# Patient Record
Sex: Female | Born: 1940
Health system: Southern US, Community
[De-identification: ages and names within clinical notes are randomized; demographics above are authoritative.]

## PROBLEM LIST (undated history)

## (undated) DIAGNOSIS — G4700 Insomnia, unspecified: Secondary | ICD-10-CM

## (undated) DIAGNOSIS — W19XXXA Unspecified fall, initial encounter: Secondary | ICD-10-CM

## (undated) DIAGNOSIS — M5430 Sciatica, unspecified side: Secondary | ICD-10-CM

## (undated) DIAGNOSIS — S0990XA Unspecified injury of head, initial encounter: Secondary | ICD-10-CM

## (undated) DIAGNOSIS — F32A Depression, unspecified: Secondary | ICD-10-CM

## (undated) DIAGNOSIS — M549 Dorsalgia, unspecified: Secondary | ICD-10-CM

## (undated) DIAGNOSIS — M2540 Effusion, unspecified joint: Secondary | ICD-10-CM

## (undated) DIAGNOSIS — F329 Major depressive disorder, single episode, unspecified: Secondary | ICD-10-CM

## (undated) DIAGNOSIS — K219 Gastro-esophageal reflux disease without esophagitis: Secondary | ICD-10-CM

## (undated) DIAGNOSIS — M48062 Spinal stenosis, lumbar region with neurogenic claudication: Secondary | ICD-10-CM

## (undated) DIAGNOSIS — R55 Syncope and collapse: Secondary | ICD-10-CM

## (undated) DIAGNOSIS — H3530 Unspecified macular degeneration: Secondary | ICD-10-CM

## (undated) DIAGNOSIS — I10 Essential (primary) hypertension: Secondary | ICD-10-CM

## (undated) DIAGNOSIS — M25469 Effusion, unspecified knee: Secondary | ICD-10-CM

## (undated) DIAGNOSIS — M2550 Pain in unspecified joint: Secondary | ICD-10-CM

## (undated) DIAGNOSIS — E785 Hyperlipidemia, unspecified: Secondary | ICD-10-CM

## (undated) DIAGNOSIS — M51369 Other intervertebral disc degeneration, lumbar region without mention of lumbar back pain or lower extremity pain: Secondary | ICD-10-CM

## (undated) DIAGNOSIS — R350 Frequency of micturition: Secondary | ICD-10-CM

## (undated) DIAGNOSIS — I340 Nonrheumatic mitral (valve) insufficiency: Secondary | ICD-10-CM

## (undated) DIAGNOSIS — H409 Unspecified glaucoma: Secondary | ICD-10-CM

## (undated) DIAGNOSIS — F419 Anxiety disorder, unspecified: Secondary | ICD-10-CM

## (undated) DIAGNOSIS — I6529 Occlusion and stenosis of unspecified carotid artery: Secondary | ICD-10-CM

## (undated) DIAGNOSIS — D329 Benign neoplasm of meninges, unspecified: Secondary | ICD-10-CM

## (undated) DIAGNOSIS — C50919 Malignant neoplasm of unspecified site of unspecified female breast: Secondary | ICD-10-CM

## (undated) DIAGNOSIS — M1711 Unilateral primary osteoarthritis, right knee: Secondary | ICD-10-CM

## (undated) DIAGNOSIS — Z96659 Presence of unspecified artificial knee joint: Secondary | ICD-10-CM

## (undated) DIAGNOSIS — M1990 Unspecified osteoarthritis, unspecified site: Secondary | ICD-10-CM

## (undated) DIAGNOSIS — R531 Weakness: Secondary | ICD-10-CM

## (undated) DIAGNOSIS — M5136 Other intervertebral disc degeneration, lumbar region: Secondary | ICD-10-CM

## (undated) HISTORY — DX: Other intervertebral disc degeneration, lumbar region: M51.36

## (undated) HISTORY — PX: COLONOSCOPY: SHX174

## (undated) HISTORY — DX: Presence of unspecified artificial knee joint: Z96.659

## (undated) HISTORY — PX: SHOULDER SURGERY: SHX246

## (undated) HISTORY — PX: BREAST EXCISIONAL BIOPSY: SUR124

## (undated) HISTORY — PX: OTHER SURGICAL HISTORY: SHX169

## (undated) HISTORY — DX: Spinal stenosis, lumbar region with neurogenic claudication: M48.062

## (undated) HISTORY — DX: Unspecified injury of head, initial encounter: S09.90XA

## (undated) HISTORY — DX: Unspecified fall, initial encounter: W19.XXXA

## (undated) HISTORY — DX: Effusion, unspecified knee: M25.469

## (undated) HISTORY — DX: Other intervertebral disc degeneration, lumbar region without mention of lumbar back pain or lower extremity pain: M51.369

## (undated) HISTORY — PX: COLPOSCOPY: SHX161

## (undated) HISTORY — DX: Unspecified glaucoma: H40.9

## (undated) HISTORY — DX: Nonrheumatic mitral (valve) insufficiency: I34.0

## (undated) HISTORY — PX: BREAST LUMPECTOMY: SHX2

## (undated) HISTORY — DX: Benign neoplasm of meninges, unspecified: D32.9

## (undated) HISTORY — PX: BREAST SURGERY: SHX581

---

## 2000-09-12 ENCOUNTER — Encounter: Admission: RE | Admit: 2000-09-12 | Discharge: 2000-09-12 | Payer: Self-pay | Admitting: Family Medicine

## 2000-09-12 ENCOUNTER — Encounter: Payer: Self-pay | Admitting: Family Medicine

## 2000-09-29 ENCOUNTER — Other Ambulatory Visit: Admission: RE | Admit: 2000-09-29 | Discharge: 2000-09-29 | Payer: Self-pay | Admitting: Family Medicine

## 2000-12-15 ENCOUNTER — Encounter: Admission: RE | Admit: 2000-12-15 | Discharge: 2000-12-15 | Payer: Self-pay | Admitting: Family Medicine

## 2000-12-15 ENCOUNTER — Encounter: Payer: Self-pay | Admitting: Family Medicine

## 2004-07-12 ENCOUNTER — Ambulatory Visit: Payer: Self-pay | Admitting: Family Medicine

## 2004-08-02 ENCOUNTER — Ambulatory Visit: Payer: Self-pay | Admitting: Family Medicine

## 2004-08-23 ENCOUNTER — Ambulatory Visit: Payer: Self-pay | Admitting: Family Medicine

## 2004-09-16 ENCOUNTER — Ambulatory Visit: Payer: Self-pay | Admitting: Family Medicine

## 2004-09-16 ENCOUNTER — Other Ambulatory Visit: Admission: RE | Admit: 2004-09-16 | Discharge: 2004-09-16 | Payer: Self-pay | Admitting: Family Medicine

## 2004-10-26 ENCOUNTER — Other Ambulatory Visit: Admission: RE | Admit: 2004-10-26 | Discharge: 2004-10-26 | Payer: Self-pay | Admitting: Family Medicine

## 2004-10-26 ENCOUNTER — Ambulatory Visit: Payer: Self-pay | Admitting: Family Medicine

## 2006-01-05 ENCOUNTER — Ambulatory Visit: Payer: Self-pay | Admitting: Family Medicine

## 2006-04-08 ENCOUNTER — Emergency Department (HOSPITAL_COMMUNITY): Admission: EM | Admit: 2006-04-08 | Discharge: 2006-04-08 | Payer: Self-pay | Admitting: Emergency Medicine

## 2007-03-05 ENCOUNTER — Telehealth: Payer: Self-pay | Admitting: Family Medicine

## 2007-03-26 HISTORY — PX: CERVICAL BIOPSY: SHX590

## 2007-04-03 ENCOUNTER — Encounter: Admission: RE | Admit: 2007-04-03 | Discharge: 2007-04-03 | Payer: Self-pay | Admitting: Obstetrics

## 2007-05-26 DIAGNOSIS — S0990XA Unspecified injury of head, initial encounter: Secondary | ICD-10-CM

## 2007-05-26 HISTORY — DX: Unspecified injury of head, initial encounter: S09.90XA

## 2007-07-31 ENCOUNTER — Encounter: Admission: RE | Admit: 2007-07-31 | Discharge: 2007-07-31 | Payer: Self-pay | Admitting: Internal Medicine

## 2007-09-21 ENCOUNTER — Observation Stay (HOSPITAL_COMMUNITY): Admission: EM | Admit: 2007-09-21 | Discharge: 2007-09-23 | Payer: Self-pay | Admitting: Emergency Medicine

## 2008-05-30 ENCOUNTER — Inpatient Hospital Stay (HOSPITAL_COMMUNITY): Admission: EM | Admit: 2008-05-30 | Discharge: 2008-06-05 | Payer: Self-pay | Admitting: Emergency Medicine

## 2008-06-02 ENCOUNTER — Ambulatory Visit: Payer: Self-pay | Admitting: Vascular Surgery

## 2008-06-02 ENCOUNTER — Encounter (INDEPENDENT_AMBULATORY_CARE_PROVIDER_SITE_OTHER): Payer: Self-pay | Admitting: Internal Medicine

## 2008-10-23 DIAGNOSIS — W19XXXA Unspecified fall, initial encounter: Secondary | ICD-10-CM

## 2008-10-23 HISTORY — DX: Unspecified fall, initial encounter: W19.XXXA

## 2009-07-23 ENCOUNTER — Ambulatory Visit (HOSPITAL_COMMUNITY): Admission: RE | Admit: 2009-07-23 | Discharge: 2009-07-23 | Payer: Self-pay | Admitting: Obstetrics

## 2010-02-23 ENCOUNTER — Encounter: Payer: Self-pay | Admitting: Cardiovascular Disease

## 2010-02-23 DIAGNOSIS — R55 Syncope and collapse: Secondary | ICD-10-CM | POA: Insufficient documentation

## 2010-02-24 ENCOUNTER — Encounter (INDEPENDENT_AMBULATORY_CARE_PROVIDER_SITE_OTHER): Payer: Self-pay | Admitting: Internal Medicine

## 2010-02-24 ENCOUNTER — Ambulatory Visit (HOSPITAL_COMMUNITY): Admission: RE | Admit: 2010-02-24 | Discharge: 2010-02-24 | Payer: Self-pay | Admitting: Internal Medicine

## 2010-02-24 ENCOUNTER — Ambulatory Visit: Payer: Self-pay

## 2010-02-24 ENCOUNTER — Ambulatory Visit: Payer: Self-pay | Admitting: Cardiovascular Disease

## 2010-02-25 ENCOUNTER — Encounter: Payer: Self-pay | Admitting: Cardiovascular Disease

## 2010-08-24 NOTE — Miscellaneous (Signed)
Summary: Orders Update  Clinical Lists Changes  Problems: Added new problem of SYNCOPE AND COLLAPSE (ICD-780.2) Orders: Added new Test order of Carotid Duplex (Carotid Duplex) - Signed

## 2010-10-25 LAB — CBC
HCT: 35.3 % — ABNORMAL LOW (ref 36.0–46.0)
MCHC: 33.7 g/dL (ref 30.0–36.0)
MCV: 93 fL (ref 78.0–100.0)
Platelets: 203 10*3/uL (ref 150–400)
RBC: 3.79 MIL/uL — ABNORMAL LOW (ref 3.87–5.11)
RDW: 12.5 % (ref 11.5–15.5)
WBC: 4.6 10*3/uL (ref 4.0–10.5)

## 2010-10-25 LAB — BASIC METABOLIC PANEL
CO2: 28 mEq/L (ref 19–32)
Calcium: 9.4 mg/dL (ref 8.4–10.5)
Creatinine, Ser: 1.1 mg/dL (ref 0.4–1.2)
Glucose, Bld: 87 mg/dL (ref 70–99)
Potassium: 4.2 mEq/L (ref 3.5–5.1)

## 2010-12-07 NOTE — Discharge Summary (Signed)
Kathryn Ortiz, Kathryn Ortiz                 ACCOUNT NO.:  [id number]   MEDICAL RECORD NO.:  [id number]          PATIENT TYPE:  INP   LOCATION:  3017                         FACILITY:  [facility]   PHYSICIAN:  Altha Harm, MDDATE OF BIRTH:  09-19-1940   DATE OF ADMISSION:  05/30/2008  DATE OF DISCHARGE:  06/05/2008                               DISCHARGE SUMMARY   DISCHARGE DISPOSITION:  Home.   FINAL DISCHARGE DIAGNOSES:  Please see the discharge summary dictated by  Dr. Mikeal Hawthorne on June 03, 2008 for details of discharge diagnosis.  Please add to discharge diagnoses possible meningioma.   DISCHARGE MEDICATIONS:  1. Lexapro 20 mg p.o. daily.  2. Hydrocodone/APAP 5/550 q.6 h. p.r.n.  3. Lotrel 5/20 mg p.o. daily.  4. Ambien 10 mg p.o. q.h.s.  5. Trazodone 25 mg p.o. q.h.s.   HOSPITAL COURSE:  Please see Dr. Maxwell Caul dictation by hospital course up  until June 03, 2008.  Hospital course between June 03, 2008 and  June 05, 2008 is as follows:  The patient was kept hospitalized at  the recommendation and request of Dr. Jeanie Sewer, psychiatrist, for  adjustment of her psychotropic medications and to make sure the patient  did not have QT prolongation along with this.  The patient had trazodone  25 mg tablet at bedtime and had improvement in her mood.  The patient is  being sent home on the above stated medications.  She has had no further  syncopal episodes.   FOLLOW UP:  The patient is to follow up with Dr. [name] within 1 week.  Please note that the patient's hospital course has been discussed with  Dr. [name] and particular note has been made of the meningioma to Dr. [name].   DIETARY RESTRICTIONS:  The patient should be on a low-sodium, heart-  healthy diet.   PHYSICAL RESTRICTIONS:  None.      Altha Harm, MD  Electronically Signed     MAM/MEDQ  D:  06/05/2008  T:  06/05/2008  Job:  [phone]

## 2010-12-07 NOTE — Discharge Summary (Signed)
[name], [name] NO.:  [id number]   MEDICAL RECORD NO.:  [id number]          PATIENT TYPE:  INP   LOCATION:  3017                         FACILITY:  [facility]   PHYSICIAN:  Altha Harm, MDDATE OF BIRTH:  Oct 03, 1940   DATE OF ADMISSION:  05/30/2008  DATE OF DISCHARGE:                               DISCHARGE SUMMARY   ADDENDUM:  After the patient had already been discharged, Antonietta Breach, M.D.,  her psychiatrist came and made adjustments to her medications as the  following:   1. Her trazodone has been increased to 50 mg p.o. nightly.  2. Lexapro has been discontinued.  3. Celexa 40 mg p.o. daily has been added in place of the Lexapro.  4. The patient also needs Klonopin 0.25 p.o. b.i.d.   Again, medications on discharge include the following:  1. Klonopin 0.25 mg p.o. b.i.d.  2. Celexa 40 mg p.o. daily.  3. Trazodone 50 mg p.o. nightly.  4. Ambien 10 mg p.o. nightly p.r.n.  5. Lotrel 5/20 mg p.o. daily.  6. Hydrocodone/APAP 5/500 mg p.o. q.6.h. p.r.n.      Altha Harm, MD  Electronically Signed     MAM/MEDQ  D:  06/05/2008  T:  06/05/2008  Job:  [id number]

## 2010-12-07 NOTE — Consult Note (Signed)
NAMEAUBRIANNA, ORCHARD                 ACCOUNT NO.:  [id number]   MEDICAL RECORD NO.:  [id number]          PATIENT TYPE:  INP   LOCATION:  3017                         FACILITY:  [facility]   PHYSICIAN:  Antonietta Breach, M.D.  DATE OF BIRTH:  1940/11/21   DATE OF CONSULTATION:  06/05/2008  DATE OF DISCHARGE:  06/05/2008                                 CONSULTATION   Ms. Blakley is greatly improved in her mood symptoms.  Her hope is now  intact.  She has constructive future goals.  She has intact interests.  Her energy is improved to almost normal.  She is not having any  hallucinations or delusions.  She has no thoughts of harming herself or  others.  She is cooperative with care.  Her memory and orientation  function are intact.  She still is having some insomnia at 25 mg of  trazodone q.h.s.   REVIEW OF SYSTEMS:  No nausea or loose stools in review of  gastrointestinal.   LABORATORY DATA:  WBC 5.0, hemoglobin 11, platelet count 168.   PHYSICAL EXAMINATION:  VITAL SIGNS:  Temperature 98.1, pulse 78,  respiratory rate 18, blood pressure 135/73, O2 saturation on room air  95%.  MENTAL STATUS:  Ms. Deol is alert.  She is oriented to all spheres.  Her  affect is slightly anxious at baseline but with a broad appropriate  response.  Her mood is within normal limits.  Speech is normal.  Thought  process logical, coherent, goal-directed.  No looseness of associations.  Thought content no thoughts of harming herself.  No thoughts of harming  others.  No delusions, no hallucinations.  Her judgment is intact.  Her  insight is intact.  She has a pleasant social smile.   ASSESSMENT:  AXIS I:  1. 293.83, mood disorder not otherwise specified stable.  2. 296.35, major depressive disorder recurrent in partial remission.  3. 293.84, anxiety disorder not otherwise specified.   Ms. Omara spine is not at risk to harm herself or others.  She agrees to  call emergency services immediately for any thoughts of  harming herself  or others.   She agrees to not drive if drowsy.   The undersigned provided ego supportive psychotherapy and further  education on treatment with the patient and her husband, as requested,  present.   RECOMMENDATIONS:  Would increase the trazodone 50 mg q.h.s. as the  previous recommendation lays out.  Please see the last report dictation.   Would continue the Lexapro 20 mg daily to synergize with the trazodone  and antidepression and antianxiety.   As mentioned before psychiatric followup is available at one of the  clinics attached to [facility], [facility] or [facility].   Ms. Furniss could benefit from cognitive behavioral therapy combined with  deep breathing and progressive muscle relaxation which could result in  less need for benzodiazepines over the long-term.   Psychiatric followup and psychotherapy followup are also available in  the local community.  Would ask the social worker to arrange these  followups.      Fayrene Fearing  Williford, M.D.  Electronically Signed     JW/MEDQ  D:  06/08/2008  T:  06/08/2008  Job:  [id number]

## 2010-12-07 NOTE — H&P (Signed)
Kathryn Ortiz, Kathryn Ortiz                 ACCOUNT NO.:  [id number]   MEDICAL RECORD NO.:  [id number]          PATIENT TYPE:  INP   LOCATION:  4735                         FACILITY:  [facility]   PHYSICIAN:  Della Goo, M.D. DATE OF BIRTH:  February 19, 1941   DATE OF ADMISSION:  05/30/2008  DATE OF DISCHARGE:                              HISTORY & PHYSICAL   PRIMARY CARE PHYSICIAN:  Massie Maroon, MD.   CHIEF COMPLAINT:  Passed out.   HISTORY OF PRESENT ILLNESS:  This is a 70 year old female who was  brought to the emergency department at [facility] by ambulance  after suffering an episode of passing out that occurred at approximately  3 p.m.  The patient was getting out of her car at church when the event  happened and bystanders gave the history which was recorded by EMS  services, and the patient's husband was also called.  The patient at the  time of my interview in the emergency department, could not remember  what had happened prior to the incident only that she had felt dizzy,  nauseous and had a headache.  She does not recall whether she had chest  pain or not.  She also denied having any shortness of breath.  The  patient also denies having any previous similar episodes to this.   PAST MEDICAL HISTORY:  1. Significant for hypertension and depression.  2. The patient also has arthritis.   PAST SURGICAL HISTORY:  1. History of arthroscopic shoulder surgery.  2. Arthroscopic left knee surgery.   MEDICATIONS:  1. Lexapro 20 mg 1 p.o. daily.  2. Lotrel 05/20 one p.o. daily.  3. Klonopin 0.5 mg 1 p.o. daily.  4. Vicodin 5/500 one tablet p.o. q.4-6 h. p.r.n. severe pain.  5. Ambien 10 mg 1 p.o. q.h.s.   ALLERGIES:  PENICILLIN which causes a rash.   SOCIAL HISTORY:  The patient is married.  She is a nonsmoker,  nondrinker.   FAMILY HISTORY:  Noncontributory.   REVIEW OF SYSTEMS:  Pertinents are mentioned above.   PHYSICAL EXAMINATION:  GENERAL:  This is a 70 year old  obese, well-  developed female in no discomfort or acute distress currently.  VITAL SIGNS:  Temperature 97.5, blood pressure 119/54, heart rate 81,  respirations 18, O2 sats 98-99%.  HEENT:  Normocephalic, atraumatic.  Pupils are equally round and  reactive to light.  Extraocular movements are intact.  Funduscopic  benign.  There is no scleral icterus.  Oropharynx is clear.  NECK:  Supple, full range of motion.  No thyromegaly, adenopathy,  jugular venous distention.  CARDIOVASCULAR:  Regular rate and rhythm.  No murmurs, gallops or rubs.  LUNGS:  Clear to auscultation bilaterally.  ABDOMEN:  Positive bowel sounds, soft, nontender, nondistended.  EXTREMITIES:  Without cyanosis, clubbing or edema.  NEUROLOGIC:  Nonfocal.   LABORATORY STUDIES:  White blood cell count 6.4, hemoglobin 12.4,  hematocrit 37.7, platelets 208, neutrophils 70%, lymphocytes 17%, MCV  92.6, sodium 140, potassium 4.1, chloride 109, bicarb 22, BUN 24,  creatinine 1.6 and glucose 124.  Point of care cardiac markers with  myoglobin of 94.0, CK-MB less than 1.0, troponin less than 0.05 and D-  dimer equal to 1.63.   ASSESSMENT:  A 70 year old female being admitted with:  1. Syncopal episode.  2. Hypertension.  3. Elevated D-dimer.  4. History of arthritis and depression.   PLAN:  The patient will be admitted to a telemetry area for monitoring.  Cardiac enzymes will be performed.  The patient will be placed on  neurologic checks.  An MRI/MRA study of the brain has been ordered along  with a VQ scan in the a.m. to further evaluate for possible pulmonary  embolism.  The patient will be placed on full-dose Lovenox therapy at  this time.  The patient has a mildly elevated BUN and creatinine, and  therefore will not be able to have a CT angiogram study performed.  The  patient has been placed on IV fluids for gentle rehydration therapy.  Her electrolytes will be monitored as well as her BUN and creatinine.  GI  prophylaxis has also been ordered.      Della Goo, M.D.  Electronically Signed     HJ/MEDQ  D:  05/31/2008  T:  05/31/2008  Job:  [id number]   cc:   Massie Maroon, MD

## 2010-12-07 NOTE — Group Therapy Note (Signed)
[name], Kathryn Ortiz                 ACCOUNT NO.:  [id number]   MEDICAL RECORD NO.:  [id number]          PATIENT TYPE:  INP   LOCATION:  4735                         FACILITY:  [facility]   PHYSICIAN:  Lonia Blood, M.D.      DATE OF BIRTH:  May 13, 1941                                 PROGRESS NOTE   PRIMARY CARE PHYSICIAN:  Massie Maroon, MD.   DISCHARGE DIAGNOSES:  1. Acute syncopal episode with negative workup.  2. Severe depression and mood disorder.  3. Hypertension.  4. Obesity.  5. Arthritis.   DISCHARGE MEDICATIONS:  To be dictated at the time of discharge.   DISPOSITION:  The patient is currently having her depression medication adjusted. Once  she is fully adjusted on a particular regimen, she will be discharged on  that regimen to home.   Procedures performed during this admission include  1. Chest x-ray on November 6 that showed no active lung disease.  2. V/Q scan on November 7 that is negative for pulmonary embolism.  3. MRI of the brain on November 8 that showed no acute infarct. There      was a 3-mm posterior falx meningioma that was seen.  The MRI of the      head showed no significant findings.   CONSULTATIONS:  To Antonietta Breach, M.D. of psychiatry.   BRIEF HISTORY AND PHYSICAL:  Please refer to dictated history and physical by Della Goo, M.D.  In short however, this is a 70 year old female who came to the ER after  having an episode of passing out.  It occurred on the day of admission.  The patient was very much stressed out, has baseline history of  hypertension as well as depression.  She was subsequently admitted with  a diagnosis of syncope.   HOSPITAL COURSE:  1. Syncope.  The patient's clinical workup for syncope included      carotid Doppler that was negative, echocardiogram on November 9      that showed a normal EF of 60%, mildly increased aortic valve but      otherwise no significant findings.  MRI/MRA of the brain also as      indicated  above was negative.  With the normal findings of her      tests, the finding was the patient was stressed out.  At this point      she may  have had vasovagal syncope, but there is no organic cause.      She has been on telemetry but nothing significant has been seen.  2. Severe depression. The patient has been friendly, very much      depressed for a long time.  She has been on antidepressants,      including Lexapro, and missed some of the doses. At this point,      however, we have psychiatry      involved, and her medications have been adjusted.  This may be the      root cause of most of her problem.  3. Hypertension, mostly controlled at this point.  Lonia Blood, M.D.  Electronically Signed     LG/MEDQ  D:  06/03/2008  T:  06/03/2008  Job:  [id number]

## 2010-12-07 NOTE — Consult Note (Signed)
[name], Ortiz                 ACCOUNT NO.:  [id number]   MEDICAL RECORD NO.:  [id number]          PATIENT TYPE:  INP   LOCATION:  3017                         FACILITY:  [facility]   PHYSICIAN:  Antonietta Breach, M.D.  DATE OF BIRTH:  May 22, 1941   DATE OF CONSULTATION:  06/02/2008  DATE OF DISCHARGE:  06/05/2008                                 CONSULTATION   REASON FOR CONSULTATION:  Depression.   HISTORY OF PRESENT ILLNESS:  Mrs. Kathryn Ortiz is a 70 year old female  admitted to the [facility] on May 30, 2008 due to syncope.   Mrs. Kathryn Ortiz had been undergoing a significant amount of general medical  stress.  She had developed 3 weeks of progressive depressed mood,  insomnia, low energy, as well as difficulty concentrating.  She has not  been having any thoughts of harming herself or others.  She has no  hallucinations, delusions.  She is cooperative with staff.  She does  have intact memory function and orientation function.   There is no agitation or combativeness.   Of note, she had been on Lexapro for a number of years for anti-  depression and is currently still on Lexapro.  It is unclear when the  dosage was increased to 20 mg daily.   She also has feeling on edge and excessive worry as well as muscle  tension and she received Xanax 0.5 mg yesterday.   Her general medical treatment has not been associated with a reduction  in symptoms and it appears that her general medical stress with  exacerbation of her illness has been precipitating symptoms.   PAST PSYCHIATRIC HISTORY:  As mentioned, Mrs. Kathryn Ortiz does have a history  of depression in the past as well as anxiety been treated with Lexapro.  She also has received Klonopin in the past for anti acute anxiety.   FAMILY PSYCHIATRIC HISTORY:  None known.   SOCIAL HISTORY:  No alcohol or illegal drugs.  She is married to a very  supportive husband.  Occupation, medically disabled.   MEDICATIONS:  The MAR is reviewed.  She  does have medication Lexapro at  this time.   PAST MEDICAL HISTORY:  Chest pain, rule out myocardial infarction.  Rule  out pulmonary embolus.  Hypertension.   LABORATORY DATA:  MRI without contrast showed no acute abnormalities.  Sodium 141, BUN 12, creatinine 1.05, glucose 85, WBC 5.3, hemoglobin  11.8, platelet count 185,000, TSH within normal limits.   REVIEW OF SYSTEMS:  Constitutional, head, eyes, ears, nose and throat,  mouth, neurologic, psychiatric, cardiovascular, respiratory,  gastrointestinal, genitourinary, skin, musculoskeletal, hematologic,  lymphatic, endocrine, metabolic all unremarkable.   EXAMINATION:  VITAL SIGNS:  Temperature 97.8, pulse 75, respiratory rate  20, blood pressure 136/63, O2 saturation on room air 98%.  GENERAL APPEARANCE:  This is a female lying in the supine position in  her hospital bed with no abnormal involuntary movements.  Her facies are  downcast.   MENTAL STATUS EXAM:  Attention span mildly decreased.  Eye contact is  good.  Concentration mildly decreased.  Affect constricted.  Mood is  very depressed.  She is oriented to all spheres.  Her memory function is  intact to immediate recent and remote.  Fund of knowledge and  intelligence are within normal limits.  Speech involves normal rate and  prosody without dysarthria.  Thought process is logical, coherent, goal  directed.  No looseness of associations.  Thought content no thoughts of  harming herself, no thoughts of harming others no delusions.  The  patient's insight is intact.  Judgment is intact.   ASSESSMENT:  AXIS I:  1. 293.83 mood disorder not otherwise specified (idiopathic and      general medical factors), depressed.  2. 296.33 major depressive disorder recurrent severe.  3. 293.84 anxiety disorder not otherwise specified.  AXIS II:  None.  AXIS III:  See past medical history.  AXIS IV:  General medical.  AXIS V:  55.   Mrs. Kathryn Ortiz is not at risk to harm herself or others.   She agrees to call  emergency services immediately for any thoughts of harming herself,  thoughts of harming others or distress.   The patient requested that her husband be present during the session for  facilitation, support and education.   The undersigned provided ego supportive psychotherapy and education.   The patient agrees to not drive if drowsy.   The indications, alternatives, and adverse effects of trazodone were  discussed with the patient for anti insomnia and augmenting Lexapro.  She understands and would like to proceed with trazodone along with  Lexapro with the trazodone and augmenting the Lexapro.   RECOMMENDATIONS:  1. Would start trazodone 25 mg nightly.  Would then increase as      tolerated by 25 mg nightly based upon the previous night's sleep      pattern until sleep is normal, not exceed trazodone 200 mg nightly.  2. Would monitor for excessive sedation, nausea and loose stools      regarding side effects of trazodone in combination with Lexapro.  3. Would continue Lexapro at the current dosage of 20 mg daily to      synergize with trazodone.  4. Mrs. Kathryn Ortiz may benefit from outpatient psychiatry as well as      psychotherapy.  The psychotherapy could focus on ego support as      well as cognitive behavioral therapy, deep breathing, and      progressive muscle relaxation.  This would decrease the need for      anti acute anxiety medications such as benzodiazepines.  5. Outpatient psychiatric and psychotherapy followup can be found at      one of the clinics attached to [facility], [facility], or      [facility].  There are also private providers in the      community.  Would ask the social worker to arrange followup.      Antonietta Breach, M.D.  Electronically Signed     JW/MEDQ  D:  06/08/2008  T:  06/08/2008  Job:  [id number]

## 2010-12-07 NOTE — Consult Note (Signed)
NAMEMANVIR, THORSON                 ACCOUNT NO.:  [id number]   MEDICAL RECORD NO.:  [id number]          PATIENT TYPE:  INP   LOCATION:  3017                         FACILITY:  [facility]   PHYSICIAN:  Antonietta Breach, M.D.  DATE OF BIRTH:  Apr 24, 1941   DATE OF CONSULTATION:  06/02/2008  DATE OF DISCHARGE:  06/05/2008                                 CONSULTATION   ADDENDUM:   ALLERGIES:  PENICILLIN.      Antonietta Breach, M.D.  Electronically Signed     JW/MEDQ  D:  06/08/2008  T:  06/08/2008  Job:  [id number]

## 2011-04-15 LAB — CARDIAC PANEL(CRET KIN+CKTOT+MB+TROPI)
CK, MB: 1.4
Relative Index: INVALID
Relative Index: INVALID
Troponin I: 0.01

## 2011-04-15 LAB — TSH: TSH: 2.274

## 2011-04-15 LAB — COMPREHENSIVE METABOLIC PANEL
Albumin: 3.7
Alkaline Phosphatase: 73
BUN: 12
Calcium: 9.6
Chloride: 105
GFR calc Af Amer: >60
GFR calc non Af Amer: 54 — ABNORMAL LOW
Glucose, Bld: 136 — ABNORMAL HIGH
Total Bilirubin: 0.5
Total Protein: 6.3

## 2011-04-15 LAB — POCT CARDIAC MARKERS
CKMB, poc: <1 — ABNORMAL LOW
CKMB, poc: <1 — ABNORMAL LOW
Myoglobin, poc: 78.2

## 2011-04-15 LAB — I-STAT 8, (EC8 V) (CONVERTED LAB)
Acid-Base Excess: 2
Glucose, Bld: 88
Hemoglobin: 13.9
Operator id: 161631
Potassium: 4.2
Sodium: 138
pCO2, Ven: 39.6 — ABNORMAL LOW

## 2011-04-15 LAB — CBC
Hemoglobin: 12.6
MCHC: 33
Platelets: 215

## 2011-04-15 LAB — DIFFERENTIAL
Basophils Relative: 1
Eosinophils Absolute: 0.1
Lymphocytes Relative: 36
Lymphs Abs: 1.9
Monocytes Absolute: 0.8
Monocytes Relative: 16 — ABNORMAL HIGH
Neutro Abs: 2.3

## 2011-04-15 LAB — TROPONIN I: Troponin I: <0.01

## 2011-04-15 LAB — PROTIME-INR
INR: 1
Prothrombin Time: 13.3

## 2011-04-26 LAB — BASIC METABOLIC PANEL
BUN: 12
BUN: 17
CO2: 26
CO2: 26
Calcium: 9
Chloride: 110
Creatinine, Ser: 1.05
Creatinine, Ser: 1.16
GFR calc non Af Amer: 52 — ABNORMAL LOW
Glucose, Bld: 85
Potassium: 4

## 2011-04-26 LAB — CARDIAC PANEL(CRET KIN+CKTOT+MB+TROPI)
CK, MB: 1.4
Relative Index: INVALID
Relative Index: INVALID
Troponin I: <0.01
Troponin I: <0.01

## 2011-04-26 LAB — APTT: aPTT: 28

## 2011-04-26 LAB — DIFFERENTIAL
Eosinophils Absolute: 0.1
Lymphocytes Relative: 17
Lymphs Abs: 1.1
Monocytes Relative: 11
Neutrophils Relative %: 70

## 2011-04-26 LAB — POCT I-STAT, CHEM 8
Creatinine, Ser: 1.6 — ABNORMAL HIGH
HCT: 37
Hemoglobin: 12.6
Potassium: 4.1
Sodium: 140
TCO2: 22

## 2011-04-26 LAB — CBC
HCT: 33.1 — ABNORMAL LOW
HCT: 35.6 — ABNORMAL LOW
HCT: 37.7
MCHC: 34.4
MCV: 92.6
MCV: 92.9
MCV: 93.5
Platelets: 179
Platelets: 184
RBC: 3.56 — ABNORMAL LOW
RBC: 3.81 — ABNORMAL LOW
RBC: 4.07
RDW: 13.6
WBC: 5
WBC: 6.4
WBC: 6.6

## 2011-04-26 LAB — URINALYSIS, ROUTINE W REFLEX MICROSCOPIC
Nitrite: NEGATIVE
Protein, ur: NEGATIVE
Specific Gravity, Urine: 1.016
Urobilinogen, UA: 0.2

## 2011-04-26 LAB — URINE CULTURE

## 2011-04-26 LAB — LIPID PANEL
Cholesterol: 172
HDL: 49
Total CHOL/HDL Ratio: 3.5
Triglycerides: 92

## 2011-04-26 LAB — POCT CARDIAC MARKERS
CKMB, poc: <1 — ABNORMAL LOW
Myoglobin, poc: 94
Troponin i, poc: <0.05
Troponin i, poc: <0.05

## 2011-04-26 LAB — TSH: TSH: 1.061

## 2011-04-26 LAB — GLUCOSE, CAPILLARY
Glucose-Capillary: 106 — ABNORMAL HIGH
Glucose-Capillary: 125 — ABNORMAL HIGH
Glucose-Capillary: 99

## 2011-04-26 LAB — PROTIME-INR: Prothrombin Time: 12.9

## 2011-04-26 LAB — TROPONIN I: Troponin I: 0.01

## 2011-04-26 LAB — CK TOTAL AND CKMB (NOT AT ARMC): CK, MB: 1.4

## 2011-11-23 DIAGNOSIS — G9519 Other vascular myelopathies: Secondary | ICD-10-CM

## 2011-11-23 DIAGNOSIS — R29818 Other symptoms and signs involving the nervous system: Secondary | ICD-10-CM

## 2011-11-23 HISTORY — DX: Other symptoms and signs involving the nervous system: R29.818

## 2011-11-23 HISTORY — DX: Other vascular myelopathies: G95.19

## 2011-12-02 ENCOUNTER — Other Ambulatory Visit: Payer: Self-pay | Admitting: Internal Medicine

## 2011-12-02 DIAGNOSIS — M549 Dorsalgia, unspecified: Secondary | ICD-10-CM

## 2011-12-12 ENCOUNTER — Ambulatory Visit
Admission: RE | Admit: 2011-12-12 | Discharge: 2011-12-12 | Disposition: A | Discharge status: Home / Self Care | Payer: Medicare Other | Source: Ambulatory Visit | Attending: Internal Medicine | Admitting: Internal Medicine

## 2011-12-12 DIAGNOSIS — M549 Dorsalgia, unspecified: Secondary | ICD-10-CM

## 2013-08-13 ENCOUNTER — Other Ambulatory Visit: Payer: Self-pay

## 2013-08-13 DIAGNOSIS — Z1231 Encounter for screening mammogram for malignant neoplasm of breast: Secondary | ICD-10-CM

## 2013-08-29 ENCOUNTER — Ambulatory Visit
Admission: RE | Admit: 2013-08-29 | Discharge: 2013-08-29 | Disposition: A | Discharge status: Home / Self Care | Payer: Medicare Other | Source: Ambulatory Visit

## 2013-08-29 DIAGNOSIS — Z1231 Encounter for screening mammogram for malignant neoplasm of breast: Secondary | ICD-10-CM

## 2014-05-25 HISTORY — PX: OTHER SURGICAL HISTORY: SHX169

## 2014-08-07 DIAGNOSIS — I10 Essential (primary) hypertension: Secondary | ICD-10-CM | POA: Diagnosis not present

## 2014-08-07 DIAGNOSIS — R739 Hyperglycemia, unspecified: Secondary | ICD-10-CM | POA: Diagnosis not present

## 2014-08-13 DIAGNOSIS — F419 Anxiety disorder, unspecified: Secondary | ICD-10-CM | POA: Diagnosis not present

## 2014-08-13 DIAGNOSIS — M25569 Pain in unspecified knee: Secondary | ICD-10-CM | POA: Diagnosis not present

## 2014-08-13 DIAGNOSIS — I10 Essential (primary) hypertension: Secondary | ICD-10-CM | POA: Diagnosis not present

## 2014-08-13 DIAGNOSIS — R739 Hyperglycemia, unspecified: Secondary | ICD-10-CM | POA: Diagnosis not present

## 2014-11-11 DIAGNOSIS — W57XXXA Bitten or stung by nonvenomous insect and other nonvenomous arthropods, initial encounter: Secondary | ICD-10-CM | POA: Diagnosis not present

## 2014-11-11 DIAGNOSIS — I10 Essential (primary) hypertension: Secondary | ICD-10-CM | POA: Diagnosis not present

## 2014-11-11 DIAGNOSIS — K219 Gastro-esophageal reflux disease without esophagitis: Secondary | ICD-10-CM | POA: Diagnosis not present

## 2014-11-11 DIAGNOSIS — R739 Hyperglycemia, unspecified: Secondary | ICD-10-CM | POA: Diagnosis not present

## 2014-12-09 DIAGNOSIS — Z961 Presence of intraocular lens: Secondary | ICD-10-CM | POA: Diagnosis not present

## 2014-12-09 DIAGNOSIS — H40023 Open angle with borderline findings, high risk, bilateral: Secondary | ICD-10-CM | POA: Diagnosis not present

## 2014-12-31 DIAGNOSIS — M549 Dorsalgia, unspecified: Secondary | ICD-10-CM | POA: Diagnosis not present

## 2014-12-31 DIAGNOSIS — I10 Essential (primary) hypertension: Secondary | ICD-10-CM | POA: Diagnosis not present

## 2014-12-31 DIAGNOSIS — R739 Hyperglycemia, unspecified: Secondary | ICD-10-CM | POA: Diagnosis not present

## 2014-12-31 DIAGNOSIS — F419 Anxiety disorder, unspecified: Secondary | ICD-10-CM | POA: Diagnosis not present

## 2015-01-12 HISTORY — PX: TOTAL KNEE ARTHROPLASTY: SHX125

## 2015-01-22 DIAGNOSIS — M859 Disorder of bone density and structure, unspecified: Secondary | ICD-10-CM | POA: Diagnosis not present

## 2015-01-22 DIAGNOSIS — I10 Essential (primary) hypertension: Secondary | ICD-10-CM | POA: Diagnosis not present

## 2015-01-22 DIAGNOSIS — R739 Hyperglycemia, unspecified: Secondary | ICD-10-CM | POA: Diagnosis not present

## 2015-02-23 DIAGNOSIS — M25569 Pain in unspecified knee: Secondary | ICD-10-CM | POA: Diagnosis not present

## 2015-02-23 DIAGNOSIS — I10 Essential (primary) hypertension: Secondary | ICD-10-CM | POA: Diagnosis not present

## 2015-02-23 DIAGNOSIS — R739 Hyperglycemia, unspecified: Secondary | ICD-10-CM | POA: Diagnosis not present

## 2015-03-03 DIAGNOSIS — M25561 Pain in right knee: Secondary | ICD-10-CM | POA: Diagnosis not present

## 2015-03-03 DIAGNOSIS — M5440 Lumbago with sciatica, unspecified side: Secondary | ICD-10-CM | POA: Diagnosis not present

## 2015-03-03 DIAGNOSIS — M1710 Unilateral primary osteoarthritis, unspecified knee: Secondary | ICD-10-CM | POA: Diagnosis not present

## 2015-03-23 DIAGNOSIS — M170 Bilateral primary osteoarthritis of knee: Secondary | ICD-10-CM | POA: Diagnosis not present

## 2015-04-03 ENCOUNTER — Encounter (HOSPITAL_COMMUNITY): Payer: Self-pay

## 2015-04-03 ENCOUNTER — Other Ambulatory Visit: Payer: Self-pay

## 2015-04-03 ENCOUNTER — Encounter (HOSPITAL_COMMUNITY)
Admission: RE | Admit: 2015-04-03 | Discharge: 2015-04-03 | Disposition: A | Discharge status: Home / Self Care | Payer: Medicare Other | Source: Ambulatory Visit | Attending: Orthopedic Surgery | Admitting: Orthopedic Surgery

## 2015-04-03 DIAGNOSIS — E785 Hyperlipidemia, unspecified: Secondary | ICD-10-CM | POA: Insufficient documentation

## 2015-04-03 DIAGNOSIS — Z01812 Encounter for preprocedural laboratory examination: Secondary | ICD-10-CM | POA: Diagnosis not present

## 2015-04-03 DIAGNOSIS — K219 Gastro-esophageal reflux disease without esophagitis: Secondary | ICD-10-CM | POA: Insufficient documentation

## 2015-04-03 DIAGNOSIS — Z79899 Other long term (current) drug therapy: Secondary | ICD-10-CM | POA: Insufficient documentation

## 2015-04-03 DIAGNOSIS — Z01818 Encounter for other preprocedural examination: Secondary | ICD-10-CM | POA: Insufficient documentation

## 2015-04-03 DIAGNOSIS — I10 Essential (primary) hypertension: Secondary | ICD-10-CM | POA: Diagnosis not present

## 2015-04-03 DIAGNOSIS — R9431 Abnormal electrocardiogram [ECG] [EKG]: Secondary | ICD-10-CM | POA: Diagnosis not present

## 2015-04-03 DIAGNOSIS — M179 Osteoarthritis of knee, unspecified: Secondary | ICD-10-CM | POA: Insufficient documentation

## 2015-04-03 HISTORY — DX: Depression, unspecified: F32.A

## 2015-04-03 HISTORY — DX: Essential (primary) hypertension: I10

## 2015-04-03 HISTORY — DX: Weakness: R53.1

## 2015-04-03 HISTORY — DX: Hyperlipidemia, unspecified: E78.5

## 2015-04-03 HISTORY — DX: Frequency of micturition: R35.0

## 2015-04-03 HISTORY — DX: Gastro-esophageal reflux disease without esophagitis: K21.9

## 2015-04-03 HISTORY — DX: Sciatica, unspecified side: M54.30

## 2015-04-03 HISTORY — DX: Dorsalgia, unspecified: M54.9

## 2015-04-03 HISTORY — DX: Anxiety disorder, unspecified: F41.9

## 2015-04-03 HISTORY — DX: Insomnia, unspecified: G47.00

## 2015-04-03 HISTORY — DX: Pain in unspecified joint: M25.50

## 2015-04-03 HISTORY — DX: Major depressive disorder, single episode, unspecified: F32.9

## 2015-04-03 HISTORY — DX: Unspecified osteoarthritis, unspecified site: M19.90

## 2015-04-03 HISTORY — DX: Effusion, unspecified joint: M25.40

## 2015-04-03 HISTORY — DX: Unspecified macular degeneration: H35.30

## 2015-04-03 LAB — SURGICAL PCR SCREEN
MRSA, PCR: NEGATIVE
STAPHYLOCOCCUS AUREUS: NEGATIVE

## 2015-04-03 LAB — BASIC METABOLIC PANEL
ANION GAP: 8 (ref 5–15)
BUN: 21 mg/dL — AB (ref 6–20)
CALCIUM: 9.1 mg/dL (ref 8.9–10.3)
CO2: 24 mmol/L (ref 22–32)
CREATININE: 1.32 mg/dL — AB (ref 0.44–1.00)
Chloride: 105 mmol/L (ref 101–111)
GFR calc Af Amer: 45 mL/min — ABNORMAL LOW (ref >60)
GFR, EST NON AFRICAN AMERICAN: 39 mL/min — AB (ref >60)
GLUCOSE: 48 mg/dL — AB (ref 65–99)
Potassium: 4.2 mmol/L (ref 3.5–5.1)
Sodium: 137 mmol/L (ref 135–145)

## 2015-04-03 LAB — CBC
HEMATOCRIT: 38.5 % (ref 36.0–46.0)
Hemoglobin: 12.8 g/dL (ref 12.0–15.0)
MCH: 30.5 pg (ref 26.0–34.0)
MCHC: 33.2 g/dL (ref 30.0–36.0)
MCV: 91.7 fL (ref 78.0–100.0)
PLATELETS: 184 10*3/uL (ref 150–400)
RBC: 4.2 MIL/uL (ref 3.87–5.11)
RDW: 13.2 % (ref 11.5–15.5)
WBC: 6.1 10*3/uL (ref 4.0–10.5)

## 2015-04-03 NOTE — Progress Notes (Addendum)
Saw a cardiologist in 2009 but only saw once and was released  Medical Md is Dr.James Maudie Mercury  Echo reports in epic from 2009/2011  Stress test report in epic from 2009  Heart cath denies ever having one  EKG denies having one in the past yr  CXR denies having one in the past yr

## 2015-04-03 NOTE — Progress Notes (Addendum)
Orders requested from [facility] with Dr.Landau.States he is in surgery but will message him.

## 2015-04-03 NOTE — Pre-Procedure Instructions (Signed)
LEYLA SOLIZ  04/03/2015      [facility] [place] [facility] [place] [address] [place] [address] Phone: [phone] Fax: [phone]    Your procedure is scheduled on Tues, Sept 20 @7 :30 AM  Report to [facility] Admitting at 5:30 AM.  Call this number if you have problems the morning of surgery:  [phone]   Remember:  Do not eat food or drink liquids after midnight.  Take these medicines the morning of surgery with A SIP OF WATER Celexa(Citalopram),Clonazepam(Klonopin),Pain Pill(if needed),and Omeprazole(Prilosec)              No Goody's,BC's,Aleve,Aspirin,Ibuprofen,Fish Oil,or Herbal Medications.   Do not wear jewelry, make-up or nail polish.  Do not wear lotions, powders, or perfumes.  You may wear deodorant.  Do not shave 48 hours prior to surgery.    Do not bring valuables to the hospital.  [facility] is not responsible for any belongings or valuables.  Contacts, dentures or bridgework may not be worn into surgery.  Leave your suitcase in the car.  After surgery it may be brought to your room.  For patients admitted to the hospital, discharge time will be determined by your treatment team.  Patients discharged the day of surgery will not be allowed to drive home.    Special instructions:  [place] - Preparing for Surgery  Before surgery, you can play an important role.  Because skin is not sterile, your skin needs to be as free of germs as possible.  You can reduce the number of germs on you skin by washing with CHG (chlorahexidine gluconate) soap before surgery.  CHG is an antiseptic cleaner which kills germs and bonds with the skin to continue killing germs even after washing.  Please DO NOT use if you have an allergy to CHG or antibacterial soaps.  If your skin becomes reddened/irritated stop using the CHG and inform your nurse when you arrive at Short Stay.  Do not shave (including legs and  underarms) for at least 48 hours prior to the first CHG shower.  You may shave your face.  Please follow these instructions carefully:   1.  Shower with CHG Soap the night before surgery and the                                morning of Surgery.  2.  If you choose to wash your hair, wash your hair first as usual with your       normal shampoo.  3.  After you shampoo, rinse your hair and body thoroughly to remove the                      Shampoo.  4.  Use CHG as you would any other liquid soap.  You can apply chg directly       to the skin and wash gently with scrungie or a clean washcloth.  5.  Apply the CHG Soap to your body ONLY FROM THE NECK DOWN.        Do not use on open wounds or open sores.  Avoid contact with your eyes,       ears, mouth and genitals (private parts).  Wash genitals (private parts)       with your normal soap.  6.  Wash thoroughly, paying special attention to the  area where your surgery        will be performed.  7.  Thoroughly rinse your body with warm water from the neck down.  8.  DO NOT shower/wash with your normal soap after using and rinsing off       the CHG Soap.  9.  Pat yourself dry with a clean towel.            10.  Wear clean pajamas.            11.  Place clean sheets on your bed the night of your first shower and do not        sleep with pets.  Day of Surgery  Do not apply any lotions/deoderants the morning of surgery.  Please wear clean clothes to the hospital/surgery center.    Please read over the following fact sheets that you were given. Pain Booklet, Coughing and Deep Breathing, MRSA Information and Surgical Site Infection Prevention

## 2015-04-06 DIAGNOSIS — I10 Essential (primary) hypertension: Secondary | ICD-10-CM | POA: Diagnosis not present

## 2015-04-06 DIAGNOSIS — R739 Hyperglycemia, unspecified: Secondary | ICD-10-CM | POA: Diagnosis not present

## 2015-04-06 DIAGNOSIS — F419 Anxiety disorder, unspecified: Secondary | ICD-10-CM | POA: Diagnosis not present

## 2015-04-07 NOTE — Progress Notes (Addendum)
Anesthesia Chart Review:  Pt is 74 year old female scheduled for R total knee arthroplasty on 04/14/2015 with Dr. Mardelle Matte.   PCP is Dr. Jani Gravel  PMH includes: HTN (untreated), hyperlipidemia, GERD. Never smoker. BMI 30.   Medications include: lovastatin, prilosec.   BP at PAT was 139/64.   Preoperative labs reviewed.  Glucose 48. Will get CBG DOS.   EKG 04/03/2015: NSR. Septal infarct, age undetermined. T wave abnormality, consider lateral ischemia. More prominant lateral T wave changes compared to 05/30/2008 per Dr. Kyla Balzarine interpretation.   Echo 02/24/2010: - Left ventricle: The cavity size was normal. Wall thickness was normal. Systolic function was normal. The estimated ejection fraction was in the range of 55% to 60%. Wall motion was normal; there were no regional wall motion abnormalities. Doppler parameters are consistent with abnormal left ventricular relaxation (grade 1 diastolic dysfunction). - Mitral valve: Mildly to moderately calcified annulus.  Carotid duplex US 02/24/2010: 0-39% L ICA stenosis. 40-59% R ICA stenosis.   Nuclear stress test 09/22/2007: 1. No evidence for pharmacologically induced myocardial reversibility. 2. Normal left ventricular systolic function.  Pt has medical clearance from Dr. Maudie Mercury.   CV symptoms were not documented at pt's PAT visit.   Myra Gianotti, PA reviewed case with Dr. Lissa Hoard. If pt without new/active CV DOS, I anticipate she can proceed as scheduled.   Willeen Cass, FNP-BC [facility]/Anesthesiology Phone: [phone] 04/08/2015 1:18 PM

## 2015-04-14 ENCOUNTER — Inpatient Hospital Stay (HOSPITAL_COMMUNITY): Payer: Medicare Other | Admitting: Emergency Medicine

## 2015-04-14 ENCOUNTER — Inpatient Hospital Stay (HOSPITAL_COMMUNITY): Payer: Medicare Other | Admitting: Anesthesiology

## 2015-04-14 ENCOUNTER — Encounter (HOSPITAL_COMMUNITY): Payer: Self-pay | Admitting: *Deleted

## 2015-04-14 ENCOUNTER — Inpatient Hospital Stay (HOSPITAL_COMMUNITY): Payer: Medicare Other

## 2015-04-14 ENCOUNTER — Encounter (HOSPITAL_COMMUNITY)
Admission: RE | Disposition: A | Discharge status: Skilled Nursing Facility | Payer: Self-pay | Source: Ambulatory Visit | Attending: Orthopedic Surgery

## 2015-04-14 ENCOUNTER — Inpatient Hospital Stay (HOSPITAL_COMMUNITY)
Admission: RE | Admit: 2015-04-14 | Discharge: 2015-04-17 | DRG: 470 | Disposition: A | Discharge status: Skilled Nursing Facility | Payer: Medicare Other | Source: Ambulatory Visit | Attending: Orthopedic Surgery | Admitting: Orthopedic Surgery

## 2015-04-14 DIAGNOSIS — G4700 Insomnia, unspecified: Secondary | ICD-10-CM | POA: Diagnosis present

## 2015-04-14 DIAGNOSIS — F419 Anxiety disorder, unspecified: Secondary | ICD-10-CM | POA: Diagnosis not present

## 2015-04-14 DIAGNOSIS — F329 Major depressive disorder, single episode, unspecified: Secondary | ICD-10-CM | POA: Diagnosis not present

## 2015-04-14 DIAGNOSIS — H3530 Unspecified macular degeneration: Secondary | ICD-10-CM | POA: Diagnosis not present

## 2015-04-14 DIAGNOSIS — M1710 Unilateral primary osteoarthritis, unspecified knee: Secondary | ICD-10-CM | POA: Diagnosis present

## 2015-04-14 DIAGNOSIS — F17220 Nicotine dependence, chewing tobacco, uncomplicated: Secondary | ICD-10-CM | POA: Diagnosis not present

## 2015-04-14 DIAGNOSIS — I10 Essential (primary) hypertension: Secondary | ICD-10-CM | POA: Diagnosis present

## 2015-04-14 DIAGNOSIS — M179 Osteoarthritis of knee, unspecified: Secondary | ICD-10-CM | POA: Diagnosis not present

## 2015-04-14 DIAGNOSIS — Z96651 Presence of right artificial knee joint: Secondary | ICD-10-CM | POA: Diagnosis not present

## 2015-04-14 DIAGNOSIS — R488 Other symbolic dysfunctions: Secondary | ICD-10-CM | POA: Diagnosis not present

## 2015-04-14 DIAGNOSIS — Z79899 Other long term (current) drug therapy: Secondary | ICD-10-CM

## 2015-04-14 DIAGNOSIS — M1711 Unilateral primary osteoarthritis, right knee: Secondary | ICD-10-CM | POA: Diagnosis not present

## 2015-04-14 DIAGNOSIS — R2681 Unsteadiness on feet: Secondary | ICD-10-CM | POA: Diagnosis not present

## 2015-04-14 DIAGNOSIS — M549 Dorsalgia, unspecified: Secondary | ICD-10-CM | POA: Diagnosis not present

## 2015-04-14 DIAGNOSIS — Z880 Allergy status to penicillin: Secondary | ICD-10-CM | POA: Diagnosis not present

## 2015-04-14 DIAGNOSIS — K219 Gastro-esophageal reflux disease without esophagitis: Secondary | ICD-10-CM | POA: Diagnosis not present

## 2015-04-14 DIAGNOSIS — E785 Hyperlipidemia, unspecified: Secondary | ICD-10-CM | POA: Diagnosis not present

## 2015-04-14 DIAGNOSIS — M25561 Pain in right knee: Secondary | ICD-10-CM | POA: Diagnosis present

## 2015-04-14 DIAGNOSIS — M1990 Unspecified osteoarthritis, unspecified site: Secondary | ICD-10-CM | POA: Diagnosis not present

## 2015-04-14 DIAGNOSIS — Z96659 Presence of unspecified artificial knee joint: Secondary | ICD-10-CM

## 2015-04-14 DIAGNOSIS — Z471 Aftercare following joint replacement surgery: Secondary | ICD-10-CM | POA: Diagnosis not present

## 2015-04-14 DIAGNOSIS — M6281 Muscle weakness (generalized): Secondary | ICD-10-CM | POA: Diagnosis not present

## 2015-04-14 HISTORY — DX: Unilateral primary osteoarthritis, right knee: M17.11

## 2015-04-14 HISTORY — PX: TOTAL KNEE ARTHROPLASTY: SHX125

## 2015-04-14 LAB — GLUCOSE, CAPILLARY: GLUCOSE-CAPILLARY: 87 mg/dL (ref 65–99)

## 2015-04-14 SURGERY — ARTHROPLASTY, KNEE, TOTAL
Anesthesia: General | Site: Knee | Laterality: Right

## 2015-04-14 MED ORDER — EPHEDRINE SULFATE 50 MG/ML IJ SOLN
INTRAMUSCULAR | Status: AC
  Filled 2015-04-14: qty 1

## 2015-04-14 MED ORDER — CEFAZOLIN SODIUM-DEXTROSE 2-3 GM-% IV SOLR
2.0000 g | Freq: Four times a day (QID) | INTRAVENOUS | Status: AC
  Administered 2015-04-14 (×2): 2 g via INTRAVENOUS
  Filled 2015-04-14 (×2): qty 50

## 2015-04-14 MED ORDER — CLONAZEPAM 0.5 MG PO TABS: 0.5000 mg | ORAL_TABLET | Freq: Every day | ORAL | Status: DC | PRN

## 2015-04-14 MED ORDER — BUPIVACAINE HCL (PF) 0.25 % IJ SOLN
INTRAMUSCULAR | Status: AC
  Filled 2015-04-14: qty 30

## 2015-04-14 MED ORDER — BUPIVACAINE HCL (PF) 0.25 % IJ SOLN
INTRAMUSCULAR | Status: DC | PRN
  Administered 2015-04-14: 20 mL

## 2015-04-14 MED ORDER — PROMETHAZINE HCL 25 MG/ML IJ SOLN
6.2500 mg | INTRAMUSCULAR | Status: DC | PRN
Start: 2015-04-14 — End: 2015-04-14

## 2015-04-14 MED ORDER — SODIUM CHLORIDE 0.9 % IR SOLN
Status: DC | PRN
  Administered 2015-04-14: 1000 mL

## 2015-04-14 MED ORDER — METHOCARBAMOL 1000 MG/10ML IJ SOLN
500.0000 mg | Freq: Four times a day (QID) | INTRAVENOUS | Status: DC | PRN
  Administered 2015-04-14: 500 mg via INTRAVENOUS
  Filled 2015-04-14 (×2): qty 5

## 2015-04-14 MED ORDER — LIDOCAINE HCL (CARDIAC) 20 MG/ML IV SOLN
INTRAVENOUS | Status: DC | PRN
  Administered 2015-04-14: 60 mg via INTRAVENOUS

## 2015-04-14 MED ORDER — MIDAZOLAM HCL 2 MG/2ML IJ SOLN
INTRAMUSCULAR | Status: AC
  Filled 2015-04-14: qty 4

## 2015-04-14 MED ORDER — SUCCINYLCHOLINE CHLORIDE 20 MG/ML IJ SOLN
INTRAMUSCULAR | Status: AC
  Filled 2015-04-14: qty 1

## 2015-04-14 MED ORDER — BISACODYL 10 MG RE SUPP: 10.0000 mg | Freq: Every day | RECTAL | Status: DC | PRN

## 2015-04-14 MED ORDER — EPHEDRINE SULFATE 50 MG/ML IJ SOLN
INTRAMUSCULAR | Status: DC | PRN
  Administered 2015-04-14: 10 mg via INTRAVENOUS

## 2015-04-14 MED ORDER — 0.9 % SODIUM CHLORIDE (POUR BTL) OPTIME
TOPICAL | Status: DC | PRN
  Administered 2015-04-14: 1000 mL

## 2015-04-14 MED ORDER — DIPHENHYDRAMINE HCL 12.5 MG/5ML PO ELIX: 12.5000 mg | ORAL_SOLUTION | ORAL | Status: DC | PRN

## 2015-04-14 MED ORDER — KETOROLAC TROMETHAMINE 30 MG/ML IJ SOLN
INTRAMUSCULAR | Status: AC
  Filled 2015-04-14: qty 1

## 2015-04-14 MED ORDER — PANTOPRAZOLE SODIUM 40 MG PO TBEC
80.0000 mg | DELAYED_RELEASE_TABLET | Freq: Every day | ORAL | Status: DC
  Administered 2015-04-15 – 2015-04-17 (×3): 80 mg via ORAL
  Filled 2015-04-14 (×4): qty 2

## 2015-04-14 MED ORDER — SENNA-DOCUSATE SODIUM 8.6-50 MG PO TABS: 2.0000 | ORAL_TABLET | Freq: Every day | ORAL | Status: DC

## 2015-04-14 MED ORDER — ACETAMINOPHEN 650 MG RE SUPP: 650.0000 mg | Freq: Four times a day (QID) | RECTAL | Status: DC | PRN

## 2015-04-14 MED ORDER — FENTANYL CITRATE (PF) 100 MCG/2ML IJ SOLN
INTRAMUSCULAR | Status: DC | PRN
  Administered 2015-04-14: 25 ug via INTRAVENOUS
  Administered 2015-04-14 (×6): 50 ug via INTRAVENOUS
  Administered 2015-04-14: 25 ug via INTRAVENOUS
  Administered 2015-04-14 (×2): 50 ug via INTRAVENOUS

## 2015-04-14 MED ORDER — ARTIFICIAL TEARS OP OINT
TOPICAL_OINTMENT | OPHTHALMIC | Status: AC
  Filled 2015-04-14: qty 3.5

## 2015-04-14 MED ORDER — KETOROLAC TROMETHAMINE 30 MG/ML IJ SOLN
INTRAMUSCULAR | Status: DC | PRN
  Administered 2015-04-14: 30 mg via INTRAVENOUS

## 2015-04-14 MED ORDER — BUPIVACAINE LIPOSOME 1.3 % IJ SUSP
INTRAMUSCULAR | Status: DC | PRN
  Administered 2015-04-14: 20 mL

## 2015-04-14 MED ORDER — ONDANSETRON HCL 4 MG/2ML IJ SOLN: 4.0000 mg | Freq: Four times a day (QID) | INTRAMUSCULAR | Status: DC | PRN

## 2015-04-14 MED ORDER — OXYCODONE HCL 5 MG PO TABS
5.0000 mg | ORAL_TABLET | ORAL | Status: DC | PRN
  Administered 2015-04-14 – 2015-04-15 (×6): 5 mg via ORAL
  Administered 2015-04-16: 10 mg via ORAL
  Administered 2015-04-16 (×2): 5 mg via ORAL
  Administered 2015-04-16 – 2015-04-17 (×4): 10 mg via ORAL
  Filled 2015-04-14: qty 1
  Filled 2015-04-14: qty 2
  Filled 2015-04-14: qty 1
  Filled 2015-04-14: qty 2
  Filled 2015-04-14 (×3): qty 1
  Filled 2015-04-14: qty 2
  Filled 2015-04-14 (×2): qty 1
  Filled 2015-04-14: qty 2
  Filled 2015-04-14 (×2): qty 1

## 2015-04-14 MED ORDER — HYDROMORPHONE HCL 1 MG/ML IJ SOLN
0.5000 mg | INTRAMUSCULAR | Status: DC | PRN
  Administered 2015-04-14: 0.5 mg via INTRAVENOUS
  Filled 2015-04-14: qty 1

## 2015-04-14 MED ORDER — METHOCARBAMOL 500 MG PO TABS
500.0000 mg | ORAL_TABLET | Freq: Four times a day (QID) | ORAL | Status: DC | PRN
  Administered 2015-04-14 – 2015-04-16 (×2): 500 mg via ORAL
  Filled 2015-04-14 (×3): qty 1

## 2015-04-14 MED ORDER — PROPOFOL 10 MG/ML IV BOLUS
INTRAVENOUS | Status: AC
  Filled 2015-04-14: qty 20

## 2015-04-14 MED ORDER — OXYCODONE-ACETAMINOPHEN 5-325 MG PO TABS: 1.0000 | ORAL_TABLET | Freq: Four times a day (QID) | ORAL | Status: DC | PRN

## 2015-04-14 MED ORDER — GLYCOPYRROLATE 0.2 MG/ML IJ SOLN
INTRAMUSCULAR | Status: DC | PRN
  Administered 2015-04-14: 0.4 mg via INTRAVENOUS

## 2015-04-14 MED ORDER — PROPOFOL 10 MG/ML IV BOLUS
INTRAVENOUS | Status: DC | PRN
  Administered 2015-04-14: 100 mg via INTRAVENOUS

## 2015-04-14 MED ORDER — MENTHOL 3 MG MT LOZG
1.0000 | LOZENGE | OROMUCOSAL | Status: DC | PRN
  Filled 2015-04-14: qty 9

## 2015-04-14 MED ORDER — CEFAZOLIN SODIUM-DEXTROSE 2-3 GM-% IV SOLR
2.0000 g | INTRAVENOUS | Status: AC
  Administered 2015-04-14: 2 g via INTRAVENOUS
  Filled 2015-04-14: qty 50

## 2015-04-14 MED ORDER — FENTANYL CITRATE (PF) 250 MCG/5ML IJ SOLN
INTRAMUSCULAR | Status: AC
  Filled 2015-04-14: qty 5

## 2015-04-14 MED ORDER — ONDANSETRON HCL 4 MG/2ML IJ SOLN
INTRAMUSCULAR | Status: DC | PRN
Start: 2015-04-14 — End: 2015-04-14
  Administered 2015-04-14: 4 mg via INTRAVENOUS

## 2015-04-14 MED ORDER — NEOSTIGMINE METHYLSULFATE 10 MG/10ML IV SOLN
INTRAVENOUS | Status: AC
  Filled 2015-04-14: qty 1

## 2015-04-14 MED ORDER — KETOROLAC TROMETHAMINE 15 MG/ML IJ SOLN
7.5000 mg | Freq: Four times a day (QID) | INTRAMUSCULAR | Status: AC
  Administered 2015-04-14 – 2015-04-15 (×4): 7.5 mg via INTRAVENOUS
  Filled 2015-04-14 (×4): qty 1

## 2015-04-14 MED ORDER — ROCURONIUM BROMIDE 50 MG/5ML IV SOLN
INTRAVENOUS | Status: AC
  Filled 2015-04-14: qty 1

## 2015-04-14 MED ORDER — SENNA 8.6 MG PO TABS
1.0000 | ORAL_TABLET | Freq: Two times a day (BID) | ORAL | Status: DC
Start: 2015-04-14 — End: 2015-04-17
  Administered 2015-04-14 – 2015-04-17 (×6): 8.6 mg via ORAL
  Filled 2015-04-14 (×6): qty 1

## 2015-04-14 MED ORDER — POLYETHYLENE GLYCOL 3350 17 G PO PACK
17.0000 g | PACK | Freq: Every day | ORAL | Status: DC | PRN
  Administered 2015-04-15: 17 g via ORAL
  Filled 2015-04-14: qty 1

## 2015-04-14 MED ORDER — OXYCODONE HCL 5 MG PO TABS
ORAL_TABLET | ORAL | Status: AC
  Filled 2015-04-14: qty 1

## 2015-04-14 MED ORDER — INFLUENZA VAC SPLIT QUAD 0.5 ML IM SUSY
0.5000 mL | PREFILLED_SYRINGE | INTRAMUSCULAR | Status: DC
  Filled 2015-04-14: qty 0.5

## 2015-04-14 MED ORDER — METOCLOPRAMIDE HCL 5 MG PO TABS: 5.0000 mg | ORAL_TABLET | Freq: Three times a day (TID) | ORAL | Status: DC | PRN

## 2015-04-14 MED ORDER — RIVAROXABAN 10 MG PO TABS: 10.0000 mg | ORAL_TABLET | Freq: Every day | ORAL | Status: DC

## 2015-04-14 MED ORDER — LACTATED RINGERS IV SOLN
INTRAVENOUS | Status: DC | PRN
  Administered 2015-04-14 (×2): via INTRAVENOUS

## 2015-04-14 MED ORDER — HYDROMORPHONE HCL 1 MG/ML IJ SOLN
INTRAMUSCULAR | Status: AC
  Filled 2015-04-14: qty 1

## 2015-04-14 MED ORDER — MAGNESIUM CITRATE PO SOLN: 1.0000 | Freq: Once | ORAL | Status: DC | PRN

## 2015-04-14 MED ORDER — POTASSIUM CHLORIDE IN NACL 20-0.45 MEQ/L-% IV SOLN
INTRAVENOUS | Status: DC
Start: 2015-04-14 — End: 2015-04-17
  Administered 2015-04-14: 14:00:00 via INTRAVENOUS
  Administered 2015-04-15: 75 mL/h via INTRAVENOUS
  Filled 2015-04-14 (×7): qty 1000

## 2015-04-14 MED ORDER — GABAPENTIN 300 MG PO CAPS
300.0000 mg | ORAL_CAPSULE | Freq: Every day | ORAL | Status: DC
  Administered 2015-04-14 – 2015-04-16 (×3): 300 mg via ORAL
  Filled 2015-04-14 (×3): qty 1

## 2015-04-14 MED ORDER — FOLIC ACID 1 MG PO TABS
1.0000 mg | ORAL_TABLET | Freq: Every day | ORAL | Status: DC
  Administered 2015-04-14 – 2015-04-17 (×4): 1 mg via ORAL
  Filled 2015-04-14 (×6): qty 1

## 2015-04-14 MED ORDER — BACLOFEN 10 MG PO TABS: 10.0000 mg | ORAL_TABLET | Freq: Three times a day (TID) | ORAL | Status: DC

## 2015-04-14 MED ORDER — SODIUM CHLORIDE 0.9 % IJ SOLN
INTRAMUSCULAR | Status: AC
  Filled 2015-04-14: qty 10

## 2015-04-14 MED ORDER — ONDANSETRON HCL 4 MG/2ML IJ SOLN
INTRAMUSCULAR | Status: AC
  Filled 2015-04-14: qty 2

## 2015-04-14 MED ORDER — PHENOL 1.4 % MT LIQD: 1.0000 | OROMUCOSAL | Status: DC | PRN

## 2015-04-14 MED ORDER — RIVAROXABAN 10 MG PO TABS
10.0000 mg | ORAL_TABLET | Freq: Every day | ORAL | Status: DC
  Administered 2015-04-15 – 2015-04-17 (×3): 10 mg via ORAL
  Filled 2015-04-14 (×3): qty 1

## 2015-04-14 MED ORDER — HYDROMORPHONE HCL 1 MG/ML IJ SOLN
0.2500 mg | INTRAMUSCULAR | Status: DC | PRN
  Administered 2015-04-14 (×3): 0.5 mg via INTRAVENOUS

## 2015-04-14 MED ORDER — BUPIVACAINE LIPOSOME 1.3 % IJ SUSP
20.0000 mL | INTRAMUSCULAR | Status: DC
  Filled 2015-04-14: qty 20

## 2015-04-14 MED ORDER — NEOSTIGMINE METHYLSULFATE 10 MG/10ML IV SOLN
INTRAVENOUS | Status: DC | PRN
  Administered 2015-04-14: 3 mg via INTRAVENOUS

## 2015-04-14 MED ORDER — ROCURONIUM BROMIDE 100 MG/10ML IV SOLN
INTRAVENOUS | Status: DC | PRN
  Administered 2015-04-14: 40 mg via INTRAVENOUS

## 2015-04-14 MED ORDER — ARTIFICIAL TEARS OP OINT
TOPICAL_OINTMENT | OPHTHALMIC | Status: DC | PRN
  Administered 2015-04-14: 1 via OPHTHALMIC

## 2015-04-14 MED ORDER — GLYCOPYRROLATE 0.2 MG/ML IJ SOLN
INTRAMUSCULAR | Status: AC
  Filled 2015-04-14: qty 3

## 2015-04-14 MED ORDER — ONDANSETRON HCL 4 MG PO TABS: 4.0000 mg | ORAL_TABLET | Freq: Three times a day (TID) | ORAL | Status: DC | PRN

## 2015-04-14 MED ORDER — DOCUSATE SODIUM 100 MG PO CAPS
100.0000 mg | ORAL_CAPSULE | Freq: Two times a day (BID) | ORAL | Status: DC
  Administered 2015-04-15 – 2015-04-17 (×6): 100 mg via ORAL
  Filled 2015-04-14 (×7): qty 1

## 2015-04-14 MED ORDER — MIDAZOLAM HCL 5 MG/5ML IJ SOLN
INTRAMUSCULAR | Status: DC | PRN
  Administered 2015-04-14 (×2): 1 mg via INTRAVENOUS

## 2015-04-14 MED ORDER — DEXAMETHASONE SODIUM PHOSPHATE 10 MG/ML IJ SOLN
10.0000 mg | Freq: Once | INTRAMUSCULAR | Status: AC
  Administered 2015-04-15: 10 mg via INTRAVENOUS
  Filled 2015-04-14: qty 1

## 2015-04-14 MED ORDER — TRAZODONE HCL 50 MG PO TABS
250.0000 mg | ORAL_TABLET | Freq: Every day | ORAL | Status: DC
  Administered 2015-04-14 – 2015-04-15 (×2): 250 mg via ORAL
  Filled 2015-04-14 (×3): qty 2
  Filled 2015-04-14 (×3): qty 1

## 2015-04-14 MED ORDER — CITALOPRAM HYDROBROMIDE 10 MG PO TABS
10.0000 mg | ORAL_TABLET | ORAL | Status: DC
  Administered 2015-04-15 – 2015-04-17 (×2): 10 mg via ORAL
  Filled 2015-04-14 (×2): qty 1

## 2015-04-14 MED ORDER — ALUM & MAG HYDROXIDE-SIMETH 200-200-20 MG/5ML PO SUSP: 30.0000 mL | ORAL | Status: DC | PRN

## 2015-04-14 MED ORDER — LIDOCAINE HCL (CARDIAC) 20 MG/ML IV SOLN
INTRAVENOUS | Status: AC
  Filled 2015-04-14: qty 10

## 2015-04-14 MED ORDER — METOCLOPRAMIDE HCL 5 MG/ML IJ SOLN: 5.0000 mg | Freq: Three times a day (TID) | INTRAMUSCULAR | Status: DC | PRN

## 2015-04-14 MED ORDER — ONDANSETRON HCL 4 MG PO TABS
4.0000 mg | ORAL_TABLET | Freq: Four times a day (QID) | ORAL | Status: DC | PRN
Start: 2015-04-14 — End: 2015-04-17

## 2015-04-14 MED ORDER — ACETAMINOPHEN 325 MG PO TABS: 650.0000 mg | ORAL_TABLET | Freq: Four times a day (QID) | ORAL | Status: DC | PRN

## 2015-04-14 SURGICAL SUPPLY — 65 items
APL SKNCLS STERI-STRIP NONHPOA (GAUZE/BANDAGES/DRESSINGS) ×1
BANDAGE ELASTIC 6 VELCRO ST LF (GAUZE/BANDAGES/DRESSINGS) ×5 IMPLANT
BANDAGE ESMARK 6X9 LF (GAUZE/BANDAGES/DRESSINGS) ×1 IMPLANT
BENZOIN TINCTURE PRP APPL 2/3 (GAUZE/BANDAGES/DRESSINGS) ×2 IMPLANT
BLADE SAG 18X100X1.27 (BLADE) ×2 IMPLANT
BLADE SAW RECIP 87.9 MT (BLADE) ×2 IMPLANT
BLADE SAW SGTL 13X75X1.27 (BLADE) ×2 IMPLANT
BNDG CMPR 9X6 STRL LF SNTH (GAUZE/BANDAGES/DRESSINGS) ×1
BNDG ESMARK 6X9 LF (GAUZE/BANDAGES/DRESSINGS) ×2
BOOTCOVER CLEANROOM LRG (PROTECTIVE WEAR) ×4 IMPLANT
BOWL SMART MIX CTS (DISPOSABLE) ×2 IMPLANT
CAP KNEE TOTAL 3 SIGMA ×1 IMPLANT
CEMENT HV SMART SET (Cement) ×4 IMPLANT
CLSR STERI-STRIP ANTIMIC 1/2X4 (GAUZE/BANDAGES/DRESSINGS) ×2 IMPLANT
COVER SURGICAL LIGHT HANDLE (MISCELLANEOUS) ×2 IMPLANT
CUFF TOURNIQUET SINGLE 34IN LL (TOURNIQUET CUFF) ×2 IMPLANT
DRAPE EXTREMITY T 121X128X90 (DRAPE) ×2 IMPLANT
DRAPE U-SHAPE 47X51 STRL (DRAPES) ×2 IMPLANT
DURAPREP 26ML APPLICATOR (WOUND CARE) ×2 IMPLANT
ELECT CAUTERY BLADE 6.4 (BLADE) ×2 IMPLANT
ELECT REM PT RETURN 9FT ADLT (ELECTROSURGICAL) ×2
ELECTRODE REM PT RTRN 9FT ADLT (ELECTROSURGICAL) ×1 IMPLANT
FACESHIELD STD STERILE (MASK) ×2 IMPLANT
GAUZE SPONGE 4X4 12PLY STRL (GAUZE/BANDAGES/DRESSINGS) ×2 IMPLANT
GLOVE BIO SURGEON STRL SZ7.5 (GLOVE) ×1 IMPLANT
GLOVE BIOGEL PI IND STRL 8 (GLOVE) ×1 IMPLANT
GLOVE BIOGEL PI INDICATOR 8 (GLOVE) ×1
GLOVE BIOGEL PI ORTHO PRO SZ8 (GLOVE) ×1
GLOVE ORTHO TXT STRL SZ7.5 (GLOVE) ×3 IMPLANT
GLOVE PI ORTHO PRO STRL SZ8 (GLOVE) ×1 IMPLANT
GLOVE SURG ORTHO 8.0 STRL STRW (GLOVE) ×2 IMPLANT
GOWN STRL REUS W/ TWL XL LVL3 (GOWN DISPOSABLE) ×1 IMPLANT
GOWN STRL REUS W/TWL 2XL LVL3 (GOWN DISPOSABLE) ×2 IMPLANT
GOWN STRL REUS W/TWL XL LVL3 (GOWN DISPOSABLE) ×2
HANDPIECE INTERPULSE COAX TIP (DISPOSABLE) ×2
HOOD PEEL AWAY FACE SHEILD DIS (HOOD) ×4 IMPLANT
IMMOBILIZER KNEE 22 (SOFTGOODS) ×2 IMPLANT
IMMOBILIZER KNEE 22 UNIV (SOFTGOODS) ×1 IMPLANT
KIT BASIN OR (CUSTOM PROCEDURE TRAY) ×2 IMPLANT
KIT ROOM TURNOVER OR (KITS) ×2 IMPLANT
MANIFOLD NEPTUNE II (INSTRUMENTS) ×2 IMPLANT
NDL 18GX1X1/2 (RX/OR ONLY) (NEEDLE) ×1 IMPLANT
NEEDLE 18GX1X1/2 (RX/OR ONLY) (NEEDLE) ×2 IMPLANT
NS IRRIG 1000ML POUR BTL (IV SOLUTION) ×2 IMPLANT
PACK TOTAL JOINT (CUSTOM PROCEDURE TRAY) ×2 IMPLANT
PACK UNIVERSAL I (CUSTOM PROCEDURE TRAY) ×2 IMPLANT
PAD ABD 8X10 STRL (GAUZE/BANDAGES/DRESSINGS) ×2 IMPLANT
PAD ARMBOARD 7.5X6 YLW CONV (MISCELLANEOUS) ×4 IMPLANT
PAD CAST 4YDX4 CTTN HI CHSV (CAST SUPPLIES) ×1 IMPLANT
PADDING CAST COTTON 4X4 STRL (CAST SUPPLIES) ×2
PADDING CAST COTTON 6X4 STRL (CAST SUPPLIES) ×2 IMPLANT
SET HNDPC FAN SPRY TIP SCT (DISPOSABLE) ×1 IMPLANT
SUCTION FRAZIER TIP 10 FR DISP (SUCTIONS) ×2 IMPLANT
SUT MNCRL AB 4-0 PS2 18 (SUTURE) IMPLANT
SUT VIC AB 0 CT1 27 (SUTURE) ×2
SUT VIC AB 0 CT1 27XBRD ANBCTR (SUTURE) ×1 IMPLANT
SUT VIC AB 2-0 CT1 27 (SUTURE) ×2
SUT VIC AB 2-0 CT1 TAPERPNT 27 (SUTURE) ×1 IMPLANT
SUT VIC AB 3-0 SH 8-18 (SUTURE) ×4 IMPLANT
SYR 30ML LL (SYRINGE) IMPLANT
SYR 50ML LL SCALE MARK (SYRINGE) ×2 IMPLANT
TOWEL OR 17X24 6PK STRL BLUE (TOWEL DISPOSABLE) ×2 IMPLANT
TOWEL OR 17X26 10 PK STRL BLUE (TOWEL DISPOSABLE) ×2 IMPLANT
TRAY CATH 16FR W/PLASTIC CATH (SET/KITS/TRAYS/PACK) ×1 IMPLANT
WATER STERILE IRR 1000ML POUR (IV SOLUTION) ×4 IMPLANT

## 2015-04-14 NOTE — H&P (Signed)
PREOPERATIVE H&P  Chief Complaint: right DJD KNEE  HPI: Kathryn Ortiz is a 74 y.o. female who presents for preoperative history and physical with a diagnosis of right DJD KNEE. Symptoms are rated as moderate to severe, and have been worsening.  This is significantly impairing activities of daily living.  She has elected for surgical management.   She has failed injections, activity modification, anti-inflammatories, and assistive devices. Pain 10/10.    Preoperative X-rays demonstrate end stage degenerative changes with osteophyte formation, loss of joint space, subchondral sclerosis.   Past Medical History  Diagnosis Date  . GERD (gastroesophageal reflux disease)     takes Omeprazole daily  . Insomnia     takes Trazodone nightly  . Hyperlipidemia     takes Lovastatin 2 times a week  . Anxiety     takes Clonazepam daily as needed  . Depression     takes Celexa daily  . Hypertension     was on meds but has been off x 2 yr  . Weakness     tingling in hands and feet  . Arthritis   . Joint pain   . Joint swelling   . Back pain     from knee pain  . Urinary frequency   . Sciatica   . Macular degeneration     dry    Past Surgical History  Procedure Laterality Date  . Shoulder surgery Bilateral   . Colonoscopy    . Epidural injections    . Cataract surgery Bilateral   . Breast surgery      breast biopsy   Social History   Social History  . Marital Status: Married    Spouse Name: N/A  . Number of Children: N/A  . Years of Education: N/A   Social History Main Topics  . Smoking status: Never Smoker   . Smokeless tobacco: Current User    Types: Chew  . Alcohol Use: No  . Drug Use: No  . Sexual Activity: Yes    Birth Control/ Protection: Post-menopausal   Other Topics Concern  . None   Social History Narrative   History reviewed. No pertinent family history. Allergies  Allergen Reactions  . Penicillins Hives, Itching and Other (See Comments)    Welps,  burning and makes patient sick. Pt. States she had problems breathing.   Prior to Admission medications   Medication Sig Start Date End Date Taking? Authorizing Provider  Cholecalciferol (VITAMIN D PO) Take 1 tablet by mouth daily.   Yes Historical Provider, MD  citalopram (CELEXA) 10 MG tablet Take 10 mg by mouth every other day.   Yes Historical Provider, MD  clonazePAM (KLONOPIN) 1 MG tablet Take 0.5 mg by mouth daily as needed for anxiety.   Yes Historical Provider, MD  Cyanocobalamin (VITAMIN B12 PO) Take 1 tablet by mouth daily.   Yes Historical Provider, MD  folic acid (FOLVITE) 1 MG tablet Take 1 mg by mouth daily.   Yes Historical Provider, MD  gabapentin (NEURONTIN) 300 MG capsule Take 300 mg by mouth at bedtime.   Yes Historical Provider, MD  HYDROcodone-acetaminophen (NORCO) 7.5-325 MG per tablet Take 1 tablet by mouth 3 (three) times daily as needed for moderate pain.   Yes Historical Provider, MD  lovastatin (MEVACOR) 10 MG tablet Take 10 mg by mouth 2 (two) times a week.   Yes Historical Provider, MD  omeprazole (PRILOSEC) 20 MG capsule Take 20 mg by mouth daily.   Yes Historical Provider, MD  traZODone (DESYREL) 100 MG tablet Take 250 mg by mouth at bedtime.   Yes Historical Provider, MD     Positive ROS: All other systems have been reviewed and were otherwise negative with the exception of those mentioned in the HPI and as above.  Physical Exam: General: Alert, no acute distress Cardiovascular: No pedal edema Respiratory: No cyanosis, no use of accessory musculature GI: No organomegaly, abdomen is soft and non-tender Skin: No lesions in the area of chief complaint Neurologic: Sensation intact distally Psychiatric: Patient is competent for consent with normal mood and affect Lymphatic: No axillary or cervical lymphadenopathy  MUSCULOSKELETAL: She has intact stability to ligamentous testing of the right knee. No significant effusion. Active motion is 5 to 95. Significant  crepitance.  Assessment: right DJD KNEE   Plan: Plan for Procedure(s): TOTAL KNEE ARTHROPLASTY  The risks benefits and alternatives were discussed with the patient including but not limited to the risks of nonoperative treatment, versus surgical intervention including infection, bleeding, nerve injury,  blood clots, cardiopulmonary complications, morbidity, mortality, among others, and they were willing to proceed.   Johnny Bridge, MD Cell [phone]   04/14/2015 6:19 AM

## 2015-04-14 NOTE — Transfer of Care (Signed)
Immediate Anesthesia Transfer of Care Note  Patient: Kathryn Ortiz  Procedure(s) Performed: Procedure(s): TOTAL KNEE ARTHROPLASTY (Right)  Patient Location: PACU  Anesthesia Type:General  Level of Consciousness: awake and alert   Airway & Oxygen Therapy: Patient Spontanous Breathing and Patient connected to nasal cannula oxygen  Post-op Assessment: Report given to RN and Post -op Vital signs reviewed and stable  Post vital signs: Reviewed and stable  Last Vitals:  Filed Vitals:   04/14/15 0613  BP: 151/57  Pulse: 71  Temp: 36.8 C  Resp: 18    Complications: No apparent anesthesia complications

## 2015-04-14 NOTE — Anesthesia Postprocedure Evaluation (Signed)
  Anesthesia Post-op Note  Patient: Kathryn Ortiz  Procedure(s) Performed: Procedure(s): TOTAL KNEE ARTHROPLASTY (Right)  Patient Location: PACU  Anesthesia Type:General  Level of Consciousness: awake and alert   Airway and Oxygen Therapy: Patient Spontanous Breathing  Post-op Pain: moderate  Post-op Assessment: Post-op Vital signs reviewed              Post-op Vital Signs: stable  Last Vitals:  Filed Vitals:   04/14/15 1230  BP: 95/54  Pulse: 70  Temp: 36.8 C  Resp: 9    Complications: No apparent anesthesia complications

## 2015-04-14 NOTE — Evaluation (Signed)
Physical Therapy Evaluation Patient Details Name: Kathryn Ortiz MRN: [id number] DOB: 08-03-1940 Today's Date: 04/14/2015   History of Present Illness  Pt is a 74 y/o F s/p Rt TKA.  Pt's PMH includes insomnia, hyperlipidemia, anxiety, depression, HTN, weakness and tingling in hands and feet, back pain, sciatica, urinary frequency, Bil shoulder surgery.  Clinical Impression  Pt is s/p Rt TKA resulting in the deficits listed below (see PT Problem List). Mrs. Foody is very motivated to return to independence; however her husband is busy w/ work during the week and will not be available to assist pt; therefore pt will benefit from SNF upon d/c.  She ambulated in room 20 ft w/ min guard assist.  Pt will benefit from skilled PT to increase their independence and safety with mobility to allow discharge to the venue listed below.     Follow Up Recommendations SNF;Supervision for mobility/OOB    Equipment Recommendations   (has RW and cane at home; may need [facility] when leave SNF)    Recommendations for Other Services       Precautions / Restrictions Precautions Precautions: Knee;Fall Precaution Booklet Issued: Yes (comment) Precaution Comments: Reviewed knee precautions Required Braces or Orthoses: Knee Immobilizer - Right Knee Immobilizer - Right: On except when in CPM Restrictions Weight Bearing Restrictions: Yes RLE Weight Bearing: Weight bearing as tolerated      Mobility  Bed Mobility Overal bed mobility: Needs Assistance Bed Mobility: Supine to Sit     Supine to sit: Min assist;HOB elevated     General bed mobility comments: Min assist managing Rt LE OOB and cues for proper sequencing.  HOB elevated but no use of bed rails.  Transfers Overall transfer level: Modified independent Equipment used: Rolling walker (2 wheeled)             General transfer comment: Min assist to stabilize RW and to power up to standing.  Cues for hand placement.     Ambulation/Gait Ambulation/Gait assistance: Min guard Ambulation Distance (Feet): 20 Feet Assistive device: Rolling walker (2 wheeled) Gait Pattern/deviations: Step-to pattern;Antalgic;Trunk flexed;Decreased weight shift to right;Decreased stride length;Decreased stance time - right;Decreased step length - left   Gait velocity interpretation: <1.8 ft/sec, indicative of risk for recurrent falls General Gait Details: Cues for sequencing and positioning of RW as pt has tendency to push it too far ahead.  Cues to stand upright.  Stairs            Wheelchair Mobility    Modified Rankin (Stroke Patients Only)       Balance Overall balance assessment: Needs assistance Sitting-balance support: Bilateral upper extremity supported;Feet supported Sitting balance-Leahy Scale: Fair Sitting balance - Comments: Min guard as pt was lethargic   Standing balance support: Bilateral upper extremity supported;During functional activity Standing balance-Leahy Scale: Poor Standing balance comment: Relies on RW                             Pertinent Vitals/Pain Pain Assessment: 0-10 Pain Score: 9  ("it hurts just a little bit") Pain Location: Rt knee Pain Descriptors / Indicators: Aching;Discomfort;Grimacing Pain Intervention(s): Limited activity within patient's tolerance;Monitored during session;Repositioned    Home Living Family/patient expects to be discharged to:: Skilled nursing facility Living Arrangements: Spouse/significant other               Additional Comments: Lives w/ husband who stay busy and will not be able to provide 24/7 assist. He is an Surveyor, mining  driver and plays golf several times a week.    Prior Function Level of Independence: Independent with assistive device(s)         Comments: PTA used cane when outside     Hand Dominance        Extremity/Trunk Assessment   Upper Extremity Assessment: Overall WFL for tasks assessed            Lower Extremity Assessment: RLE deficits/detail RLE Deficits / Details: weakness and limited ROM as expected s/p Rt TKA       Communication   Communication: No difficulties  Cognition Arousal/Alertness: Lethargic Behavior During Therapy: WFL for tasks assessed/performed Overall Cognitive Status: Within Functional Limits for tasks assessed                      General Comments      Exercises Total Joint Exercises Ankle Circles/Pumps: AROM;Both;10 reps;Supine Quad Sets: Strengthening;Both;10 reps;Supine Straight Leg Raises: Strengthening;Both;10 reps;Supine      Assessment/Plan    PT Assessment Patient needs continued PT services  PT Diagnosis Difficulty walking;Abnormality of gait;Generalized weakness;Acute pain   PT Problem List Decreased strength;Decreased activity tolerance;Decreased range of motion;Decreased balance;Decreased mobility;Decreased knowledge of use of DME;Decreased safety awareness;Decreased knowledge of precautions;Decreased skin integrity;Pain  PT Treatment Interventions DME instruction;Gait training;Stair training;Functional mobility training;Therapeutic activities;Therapeutic exercise;Balance training;Neuromuscular re-education;Patient/family education;Modalities   PT Goals (Current goals can be found in the Care Plan section) Acute Rehab PT Goals Patient Stated Goal: to get stronger and go to rehab before home PT Goal Formulation: With patient/family Time For Goal Achievement: 04/21/15 Potential to Achieve Goals: Good    Frequency 7X/week   Barriers to discharge Decreased caregiver support Intermittent assist available from husband only at d/c    Co-evaluation               End of Session Equipment Utilized During Treatment: Gait belt;Right knee immobilizer Activity Tolerance: Patient limited by pain;Patient limited by lethargy Patient left: in chair;with call bell/phone within reach;with family/visitor present;with  nursing/sitter in room Nurse Communication: Mobility status;Precautions;Weight bearing status         Time: 1346-1428 (15 minutes spent looking for a recliner chair for pt) PT Time Calculation (min) (ACUTE ONLY): 42 min   Charges:   PT Evaluation $Initial PT Evaluation Tier I: 1 Procedure PT Treatments $Therapeutic Exercise: 8-22 mins   PT G Codes:       Joslyn Hy PT, DPT [phone] Pager: [phone] 04/14/2015, 2:47 PM

## 2015-04-14 NOTE — Anesthesia Preprocedure Evaluation (Addendum)
Anesthesia Evaluation  Patient identified by MRN, date of birth, ID band Patient awake    Reviewed: Allergy & Precautions, NPO status , Patient's Chart, lab work & pertinent test results  Airway Mallampati: II  TM Distance: >3 FB Neck ROM: Full    Dental  (+) Edentulous Upper, Edentulous Lower   Pulmonary neg pulmonary ROS,    breath sounds clear to auscultation       Cardiovascular hypertension,  Rhythm:Regular Rate:Normal     Neuro/Psych  Neuromuscular disease    GI/Hepatic GERD  ,  Endo/Other    Renal/GU      Musculoskeletal  (+) Arthritis ,   Abdominal   Peds  Hematology   Anesthesia Other Findings   Reproductive/Obstetrics                            Anesthesia Physical Anesthesia Plan  ASA: II  Anesthesia Plan: General   Post-op Pain Management:    Induction: Intravenous  Airway Management Planned: Natural Airway  Additional Equipment:   Intra-op Plan:   Post-operative Plan:   Informed Consent: I have reviewed the patients History and Physical, chart, labs and discussed the procedure including the risks, benefits and alternatives for the proposed anesthesia with the patient or authorized representative who has indicated his/her understanding and acceptance.   Dental advisory given  Plan Discussed with:   Anesthesia Plan Comments:         Anesthesia Quick Evaluation

## 2015-04-14 NOTE — Progress Notes (Signed)
Utilization review completed.  

## 2015-04-14 NOTE — Anesthesia Procedure Notes (Signed)
Procedure Name: Intubation Date/Time: 04/14/2015 7:43 AM Performed by: Suzy Bouchard Pre-anesthesia Checklist: Patient identified, Emergency Drugs available, Patient being monitored, Suction available and Timeout performed Patient Re-evaluated:Patient Re-evaluated prior to inductionOxygen Delivery Method: Circle system utilized Preoxygenation: Pre-oxygenation with 100% oxygen Intubation Type: IV induction Ventilation: Mask ventilation without difficulty Laryngoscope Size: Miller and 2 Grade View: Grade I Tube type: Oral Tube size: 7.0 mm Number of attempts: 1 Airway Equipment and Method: Stylet Placement Confirmation: ETT inserted through vocal cords under direct vision,  breath sounds checked- equal and bilateral and positive ETCO2 Secured at: 22 cm Tube secured with: Tape Dental Injury: Teeth and Oropharynx as per pre-operative assessment

## 2015-04-14 NOTE — Progress Notes (Signed)
Patient arrived from PACU. VSS. Patient denies pain or nausea at this time. Neurovascular checks WNL. Dressing clean, dry and intact. Patient and family oriented to unit. Bed alarm on, bed in lowest position and call bell within reach.

## 2015-04-14 NOTE — Progress Notes (Signed)
Orthopedic Tech Progress Note Patient Details:  Kathryn Ortiz 1940-09-19 [id number]  Ortho Devices Type of Ortho Device: Knee Immobilizer Ortho Device/Splint Location: Trapeze bar Ortho Device/Splint Interventions: Application   [name] 04/14/2015, 3:59 PM

## 2015-04-14 NOTE — Op Note (Signed)
DATE OF SURGERY:  04/14/2015 TIME: 9:14 AM  PATIENT NAME:  Kathryn Ortiz   AGE: 74 y.o.    PRE-OPERATIVE DIAGNOSIS:  right DJD KNEE, primary localized  POST-OPERATIVE DIAGNOSIS:  Same  PROCEDURE:  Procedure(s): TOTAL KNEE ARTHROPLASTY   SURGEON:  Kathryn Bridge, MD   ASSISTANT:  Kathryn Ortiz, OPA-C, present and scrubbed throughout the case, critical for assistance with exposure, retraction, instrumentation, and closure.   OPERATIVE IMPLANTS: Depuy PFC Sigma, Posterior Stabilized.  Femur size 2.5, Tibia size 3, Patella size 38 3-peg oval button, with a 10 mm polyethylene insert.   PREOPERATIVE INDICATIONS:  Kathryn Ortiz is a 74 y.o. year old female with end stage bone on bone degenerative arthritis of the knee who failed conservative treatment, including injections, antiinflammatories, activity modification, and assistive devices, and had significant impairment of their activities of daily living, and elected for Total Knee Arthroplasty.   The risks, benefits, and alternatives were discussed at length including but not limited to the risks of infection, bleeding, nerve injury, stiffness, blood clots, the need for revision surgery, cardiopulmonary complications, among others, and they were willing to proceed.  OPERATIVE FINDINGS AND UNIQUE ASPECTS OF THE CASE:  There was substantial osteophyte formation throughout, obscuring the lateral femoral condylar ridge.  The patella had severe wear. The femoral component was placed slightly laterally in order to accommodate for the the severe patellar wear and maltracking. The tibia was cut at two clicks over with the jig at 24 of slope.  I did have to cut the tibia twice to get the appropriate gap.    OPERATIVE DESCRIPTION:  The patient was brought to the operative room and placed in a supine position.  General anesthesia was administered.  IV antibiotics were given.  The lower extremity was prepped and draped in the usual sterile fashion.   Time out was performed.  The leg was elevated and exsanguinated and the tourniquet was inflated.  Anterior quadriceps tendon splitting approach was performed.  The patella was everted and osteophytes were removed.  The anterior horn of the medial and lateral meniscus was removed.   The distal femur was opened with the drill and the intramedullary distal femoral cutting jig was utilized, set at 5 degrees resecting 10 mm off the distal femur.  Care was taken to protect the collateral ligaments.  Then the extramedullary tibial cutting jig was utilized making the appropriate cut using the anterior tibial crest as a reference building in appropriate posterior slope.  Care was taken during the cut to protect the medial and collateral ligaments.  The proximal tibia was removed along with the posterior horns of the menisci.  The PCL was sacrificed.    The extensor gap was measured and was approximately 74mm.    The distal femoral sizing jig was applied, taking care to avoid notching.  Then the 4-in-1 cutting jig was applied and the anterior and posterior femur was cut, along with the chamfer cuts.  All posterior osteophytes were removed.  The flexion gap was then measured and was symmetric with the extension gap.  I completed the distal femoral preparation using the appropriate jig to prepare the box.  The patella was then measured, and cut with the saw.  The thickness before the cut was 19 and after the cut was 14.  The proximal tibia sized and prepared accordingly with the reamer and the punch, and then all components were trialed with the 49mm poly insert.  The knee was found to have excellent  balance and full motion.    The above named components were then cemented into place and all excess cement was removed.  The real polyethylene implant was placed.  After the cement had cured I released the tourniquet and confirmed excellent hemostasis with no major posterior vessel injury.    The knee was easily  taken through a range of motion and the patella tracked well and the knee irrigated copiously and the parapatellar and subcutaneous tissue closed with vicryl, and monocryl with steri strips for the skin.  The wounds were injected with marcaine, and dressed with sterile gauze and the patient was awakened and returned to the PACU in stable and satisfactory condition.  There were no complications.  Total tourniquet time was ~ 1 hr 15 min minutes.

## 2015-04-14 NOTE — Discharge Instructions (Signed)

## 2015-04-14 NOTE — Progress Notes (Signed)
Pt. Reports she has an ingrown toenail on right big toe. Dr. Mardelle Matte  Looked at the toe.

## 2015-04-14 NOTE — OR Nursing (Signed)
Patient did not have a reaction to Ancef

## 2015-04-15 ENCOUNTER — Encounter (HOSPITAL_COMMUNITY): Payer: Self-pay | Admitting: Orthopedic Surgery

## 2015-04-15 LAB — CBC
HCT: 30.9 % — ABNORMAL LOW (ref 36.0–46.0)
HEMOGLOBIN: 10 g/dL — AB (ref 12.0–15.0)
MCH: 29.8 pg (ref 26.0–34.0)
MCHC: 32.4 g/dL (ref 30.0–36.0)
MCV: 92 fL (ref 78.0–100.0)
Platelets: 161 10*3/uL (ref 150–400)
RBC: 3.36 MIL/uL — ABNORMAL LOW (ref 3.87–5.11)
RDW: 13.4 % (ref 11.5–15.5)
WBC: 6.7 10*3/uL (ref 4.0–10.5)

## 2015-04-15 LAB — BASIC METABOLIC PANEL
Anion gap: 5 (ref 5–15)
BUN: 8 mg/dL (ref 6–20)
CALCIUM: 8.3 mg/dL — AB (ref 8.9–10.3)
CHLORIDE: 102 mmol/L (ref 101–111)
CO2: 28 mmol/L (ref 22–32)
CREATININE: 1.26 mg/dL — AB (ref 0.44–1.00)
GFR calc non Af Amer: 41 mL/min — ABNORMAL LOW (ref >60)
GFR, EST AFRICAN AMERICAN: 47 mL/min — AB (ref >60)
Glucose, Bld: 109 mg/dL — ABNORMAL HIGH (ref 65–99)
Potassium: 4.7 mmol/L (ref 3.5–5.1)
SODIUM: 135 mmol/L (ref 135–145)

## 2015-04-15 NOTE — Progress Notes (Signed)
Physical Therapy Treatment Patient Details Name: Kathryn Ortiz MRN: [id number] DOB: 10/07/1940 Today's Date: 04/15/2015    History of Present Illness Pt is a 74 y/o F s/p Rt TKA.  Pt's PMH includes insomnia, hyperlipidemia, anxiety, depression, HTN, weakness and tingling in hands and feet, back pain, sciatica, urinary frequency, Bil shoulder surgery.    PT Comments    Ms. Hacker requires cues when ambulating to stand upright as she has tendency to lean forward.  She was incontinent w/ her urine and will need new KI and SCDs, appropriate entities notified.  Pt will benefit from continued skilled PT services to increase functional independence and safety.   Follow Up Recommendations  SNF;Supervision for mobility/OOB     Equipment Recommendations   (has RW and cane at home; may need [facility] when leave SNF)    Recommendations for Other Services       Precautions / Restrictions Precautions Precautions: Knee;Fall Precaution Comments: Reviewed knee precautions Required Braces or Orthoses: Knee Immobilizer - Right Knee Immobilizer - Right: On except when in CPM Restrictions Weight Bearing Restrictions: Yes RLE Weight Bearing: Weight bearing as tolerated    Mobility  Bed Mobility Overal bed mobility: Needs Assistance Bed Mobility: Supine to Sit     Supine to sit: Min assist     General bed mobility comments: Min assist managing Rt LE OOB and cues for proper sequencing.  HOB flat but pt uses bed rails.  Transfers Overall transfer level: Needs assistance Equipment used: Rolling walker (2 wheeled) Transfers: Sit to/from Stand Sit to Stand: Min assist         General transfer comment: Min assist to stabilize RW.  Cues to stand upright as pt demonstrates severe trunk flexion.    Ambulation/Gait Ambulation/Gait assistance: Min guard Ambulation Distance (Feet): 40 Feet Assistive device: Rolling walker (2 wheeled) Gait Pattern/deviations: Step-to pattern;Antalgic;Decreased stance  time - right;Decreased stride length;Decreased weight shift to right;Trunk flexed   Gait velocity interpretation: <1.8 ft/sec, indicative of risk for recurrent falls General Gait Details: Cues for standing upright and positioning of RW as pt continues to push it too far ahead of her. Pt fatigues after ambulating to bathroom and in room.   Stairs            Wheelchair Mobility    Modified Rankin (Stroke Patients Only)       Balance Overall balance assessment: Needs assistance Sitting-balance support: Feet supported;Bilateral upper extremity supported Sitting balance-Leahy Scale: Fair     Standing balance support: Bilateral upper extremity supported;During functional activity Standing balance-Leahy Scale: Poor Standing balance comment: Relies on RW and cues to stand upright                    Cognition Arousal/Alertness: Awake/alert Behavior During Therapy: WFL for tasks assessed/performed Overall Cognitive Status: Within Functional Limits for tasks assessed                      Exercises Total Joint Exercises Ankle Circles/Pumps: AROM;Both;10 reps;Supine Quad Sets: Strengthening;Both;5 reps;Seated (Pt w/ difficulty understanding exercise; despite demonstrati) Heel Slides: AROM;Right;5 reps;Seated Straight Leg Raises: 10 reps;AAROM;Right;Seated Long Arc Quad: AROM;Right;5 reps;Seated Knee Flexion: AAROM;Right;5 reps;Seated Goniometric ROM: 5-82    General Comments General comments (skin integrity, edema, etc.): Pt w/ urinary incontinence walking to the bathroom.  She needs a new KI and SCDs as they were soiled.  Ortho tech notified of pt's needs for new KI and RN to follow up on SCDs.  Provided pericare  before ambulating in room.      Pertinent Vitals/Pain Pain Assessment: 0-10 Pain Score: 9  Pain Location: Rt knee Pain Descriptors / Indicators: Aching;Sore Pain Intervention(s): Limited activity within patient's tolerance;Monitored during  session;Repositioned;RN gave pain meds during session    Home Living                      Prior Function            PT Goals (current goals can now be found in the care plan section) Acute Rehab PT Goals Patient Stated Goal: to get stronger and go to rehab before home PT Goal Formulation: With patient/family Time For Goal Achievement: 04/21/15 Potential to Achieve Goals: Good Progress towards PT goals: Progressing toward goals    Frequency  7X/week    PT Plan Current plan remains appropriate    Co-evaluation             End of Session Equipment Utilized During Treatment: Gait belt;Right knee immobilizer Activity Tolerance: Patient limited by pain;Patient limited by fatigue Patient left: in chair;with call bell/phone within reach     Time: 1435-1509 PT Time Calculation (min) (ACUTE ONLY): 34 min  Charges:  $Gait Training: 8-22 mins $Therapeutic Exercise: 8-22 mins                    G Codes:      Joslyn Hy PT, [place] [phone] Pager: [phone] 04/15/2015, 3:24 PM

## 2015-04-15 NOTE — Progress Notes (Signed)
Orthopedic Tech Progress Note Patient Details:  Kathryn Ortiz 1941/02/01 [id number]  Ortho Devices Type of Ortho Device: Knee Immobilizer Ortho Device/Splint Location: rle Ortho Device/Splint Interventions: Application   [name] 04/15/2015, 3:52 PM

## 2015-04-15 NOTE — Clinical Social Work Note (Signed)
Clinical Social Work Assessment  Patient Details  Name: Kathryn Ortiz MRN: [id number] Date of Birth: [birth year]  Date of referral:  04/15/15               Reason for consult:  Facility Placement, Discharge Planning                Permission sought to share information with:  Facility Sport and exercise psychologist, Family Supports Permission granted to share information::  Yes, Verbal Permission Granted  Name::     [address]::  [employer] (preference: Health and safety inspector)  Relationship::  Husband  Contact Information:     Housing/Transportation Living arrangements for the past 2 months:  Single Family Home Source of Information:  Patient Patient Interpreter Needed:  None Criminal Activity/Legal Involvement Pertinent to Current Situation/Hospitalization:  No - Comment as needed Significant Relationships:  Spouse Lives with:  Spouse Do you feel safe going back to the place where you live?  No (High fall risk.) Need for family participation in patient care:  No (Coment) (Patient able to make own decisions.)  Care giving concerns:  Patient expressed no concerns at this time.   Social Worker assessment / plan:  CSW received referral for possible SNF placement at time of discharge. CSW met with patient to discuss discharge disposition. Per patient, patient understanding of PT recommendation and would prefer to be placed at [facility] once medically stable. CSW to continue to follow and assist with discharge planning needs.  Employment status:  Retired Forensic scientist:  Programmer, applications (Marine scientist) PT Recommendations:  [place] / Referral to community resources:  [place]  Patient/Family's Response to care:  Patient understanding and agreeable to CSW plan of care.  Patient/Family's Understanding of and Emotional Response to Diagnosis, Current Treatment, and Prognosis:  Patient understanding and agreeable to CSW plan of  care.  Emotional Assessment Appearance:  Appears stated age Attitude/Demeanor/Rapport:  Other (Pleasant.) Affect (typically observed):  Accepting, Appropriate, Pleasant Orientation:  Oriented to Self, Oriented to Place, Oriented to  Time, Oriented to Situation Alcohol / Substance use:  Not Applicable Psych involvement (Current and /or in the community):  No (Comment) (Not appropriate on this admission.)  Discharge Needs  Concerns to be addressed:  No discharge needs identified Readmission within the last 30 days:  No Current discharge risk:  None Barriers to Discharge:  No Barriers Identified   Caroline Sauger, LCSW 04/15/2015, 10:38 AM [phone]

## 2015-04-15 NOTE — Progress Notes (Signed)
     Subjective:  Patient reports pain as moderate.  Overall doing well.   Objective:   VITALS:   Filed Vitals:   04/14/15 1905 04/14/15 2130 04/15/15 0155 04/15/15 0445  BP: 150/60 122/55 95/48 109/79  Pulse: 94 68 82 88  Temp: 97.5 F (36.4 C) 98.2 F (36.8 C) 97.7 F (36.5 C) 98.1 F (36.7 C)  TempSrc: Oral Oral Oral Oral  Resp: 16 16 16 16   SpO2: 99% 100% 96% 100%    Neurologically intact Dorsiflexion/Plantar flexion intact Incision: dressing C/D/I   Lab Results  Component Value Date   WBC 6.7 04/15/2015   HGB 10.0* 04/15/2015   HCT 30.9* 04/15/2015   MCV 92.0 04/15/2015   PLT 161 04/15/2015   BMET    Component Value Date/Time   NA 135 04/15/2015 0525   K 4.7 04/15/2015 0525   CL 102 04/15/2015 0525   CO2 28 04/15/2015 0525   GLUCOSE 109* 04/15/2015 0525   BUN 8 04/15/2015 0525   CREATININE 1.26* 04/15/2015 0525   CALCIUM 8.3* 04/15/2015 0525   GFRNONAA 41* 04/15/2015 0525   GFRAA 47* 04/15/2015 0525     Assessment/Plan: 1 Day Post-Op   Principal Problem:   Osteoarthritis of right knee, primary localized Active Problems:   Knee osteoarthritis   Advance diet Up with therapy Discharge to SNF Probable snf thurs vs. fri.   LANDAU,JOSHUA P 04/15/2015, 8:15 AM   Marchia Bond, MD Cell [phone]

## 2015-04-15 NOTE — Clinical Social Work Placement (Signed)
   CLINICAL SOCIAL WORK PLACEMENT  NOTE  Date:  04/15/2015  Patient Details  Name: Kathryn Ortiz MRN: [id number] Date of Birth: 1941/03/15  Clinical Social Work is seeking post-discharge placement for this patient at the [place] level of care (*CSW will initial, date and re-position this form in  chart as items are completed):  Yes   Patient/family provided with [facility] Work Department's list of facilities offering this level of care within the geographic area requested by the patient (or if unable, by the patient's family).  Yes   Patient/family informed of their freedom to choose among providers that offer the needed level of care, that participate in Medicare, Medicaid or managed care program needed by the patient, have an available bed and are willing to accept the patient.  Yes   Patient/family informed of [facility]'s ownership interest in [facility] and [facility], as well as of the fact that they are under no obligation to receive care at these facilities.  PASRR submitted to EDS on 04/15/15     PASRR number received on 04/15/15     Existing PASRR number confirmed on  (n/a)     FL2 transmitted to all facilities in geographic area requested by pt/family on 04/15/15     FL2 transmitted to all facilities within larger geographic area on  (n/a)     Patient informed that his/her managed care company has contracts with or will negotiate with certain facilities, including the following:   (yes, [facility].)         Patient/family informed of bed offers received.  Patient chooses bed at       Physician recommends and patient chooses bed at      Patient to be transferred to   on  .  Patient to be transferred to facility by       Patient family notified on   of transfer.  Name of family member notified:        PHYSICIAN Please sign FL2     Additional Comment:     _______________________________________________ Caroline Sauger, LCSW 04/15/2015, 10:40 AM

## 2015-04-16 LAB — CBC
HCT: 27.8 % — ABNORMAL LOW (ref 36.0–46.0)
HEMOGLOBIN: 9 g/dL — AB (ref 12.0–15.0)
MCH: 29.5 pg (ref 26.0–34.0)
MCHC: 32.4 g/dL (ref 30.0–36.0)
MCV: 91.1 fL (ref 78.0–100.0)
PLATELETS: 146 10*3/uL — AB (ref 150–400)
RBC: 3.05 MIL/uL — ABNORMAL LOW (ref 3.87–5.11)
RDW: 13.3 % (ref 11.5–15.5)
WBC: 10.8 10*3/uL — ABNORMAL HIGH (ref 4.0–10.5)

## 2015-04-16 NOTE — Progress Notes (Signed)
Physical Therapy Treatment Patient Details Name: Kathryn Ortiz MRN: [id number] DOB: Sep 26, 1940 Today's Date: 04/16/2015    History of Present Illness Pt is a 74 y/o F s/p Rt TKA.  Pt's PMH includes insomnia, hyperlipidemia, anxiety, depression, HTN, weakness and tingling in hands and feet, back pain, sciatica, urinary frequency, Bil shoulder surgery.    PT Comments    [name] is progressing w/ therapy but is limited by quick fatiguing of her Bil UEs and Rt LE after ambulating 50 ft.  Cues provided for upright posture and to WBAT during ambulation.  Pt will benefit from continued skilled PT services to increase functional independence and safety.   Follow Up Recommendations  SNF;Supervision for mobility/OOB     Equipment Recommendations   (has RW and cane at home; may need [facility] when leave SNF)    Recommendations for Other Services       Precautions / Restrictions Precautions Precautions: Knee;Fall Precaution Comments: Reviewed knee precautions Required Braces or Orthoses: Knee Immobilizer - Right Knee Immobilizer - Right: On except when in CPM Restrictions Weight Bearing Restrictions: Yes RLE Weight Bearing: Weight bearing as tolerated    Mobility  Bed Mobility Overal bed mobility: Needs Assistance Bed Mobility: Supine to Sit     Supine to sit: Min assist     General bed mobility comments: Min assist managing Rt LE OOB and cues for proper sequencing.  HOB flat but pt uses bed rails.  Transfers Overall transfer level: Needs assistance Equipment used: Rolling walker (2 wheeled) Transfers: Sit to/from Stand Sit to Stand: Min assist         General transfer comment: Min assist to stabilize RW.  Pt w/ better posture this session but still requires cues to stand upright.     Ambulation/Gait Ambulation/Gait assistance: Min guard Ambulation Distance (Feet): 50 Feet Assistive device: Rolling walker (2 wheeled) Gait Pattern/deviations: Step-to pattern;Antalgic;Trunk  flexed;Decreased stance time - right;Decreased weight shift to right   Gait velocity interpretation: <1.8 ft/sec, indicative of risk for recurrent falls General Gait Details: Pt initially reluctant to place weight on Rt LE.  Cues provided to WBAT and to stand upright.  Pt able to ambulate 50 ft before reporting fatigue and pain in Rt LE.   Stairs            Wheelchair Mobility    Modified Rankin (Stroke Patients Only)       Balance Overall balance assessment: Needs assistance Sitting-balance support: Feet supported;Bilateral upper extremity supported Sitting balance-Leahy Scale: Fair     Standing balance support: Bilateral upper extremity supported;During functional activity Standing balance-Leahy Scale: Fair Standing balance comment: Pt able to stand w/ supervision EOB while adjusting undergarment w/o support of RW                    Cognition Arousal/Alertness: Awake/alert Behavior During Therapy: WFL for tasks assessed/performed Overall Cognitive Status: Within Functional Limits for tasks assessed                      Exercises Total Joint Exercises Ankle Circles/Pumps: AROM;Both;10 reps;Supine Quad Sets: Strengthening;Both;10 reps;Supine Straight Leg Raises: 10 reps;Right;AROM;Supine    General Comments General comments (skin integrity, edema, etc.): Pt's husband present during PT session.  Encouraged pt to sit in chair for at least 2 hours to her tolerance and to continue performing her exercises that we have practiced together.  She should complete these 3x/day.      Pertinent Vitals/Pain Pain Assessment: 0-10 Pain  Score: 9  Pain Location: Rt knee Pain Descriptors / Indicators: Sore Pain Intervention(s): Limited activity within patient's tolerance;Monitored during session;Repositioned    Home Living                      Prior Function            PT Goals (current goals can now be found in the care plan section) Acute Rehab PT  Goals Patient Stated Goal: to get stronger and go to rehab before home PT Goal Formulation: With patient/family Time For Goal Achievement: 04/21/15 Potential to Achieve Goals: Good Progress towards PT goals: Progressing toward goals    Frequency  7X/week    PT Plan Current plan remains appropriate    Co-evaluation             End of Session Equipment Utilized During Treatment: Gait belt;Right knee immobilizer Activity Tolerance: Patient limited by pain;Patient limited by fatigue Patient left: in chair;with call bell/phone within reach;with family/visitor present     Time: 1141-1200 PT Time Calculation (min) (ACUTE ONLY): 19 min  Charges:  $Gait Training: 8-22 mins                    G Codes:      Joslyn Hy PT, [place] [phone] Pager: [phone] 04/16/2015, 12:16 PM

## 2015-04-16 NOTE — Progress Notes (Signed)
Physical Therapy Treatment Patient Details Name: Kathryn Ortiz MRN: [id number] DOB: 05/08/1941 Today's Date: 04/16/2015    History of Present Illness Pt is a 74 y/o F s/p Rt TKA.  Pt's PMH includes insomnia, hyperlipidemia, anxiety, depression, HTN, weakness and tingling in hands and feet, back pain, sciatica, urinary frequency, Bil shoulder surgery.    PT Comments    Kathryn Ortiz is making modest progress w/ ambulation, limited by her pain and fatigue.  She completed therapeutic exercises w/ assist once supine in bed.  Pt will benefit from continued skilled PT services to increase functional independence and safety.   Follow Up Recommendations  SNF;Supervision for mobility/OOB     Equipment Recommendations   (has RW and cane at home; may need [facility] when leave SNF)    Recommendations for Other Services       Precautions / Restrictions Precautions Precautions: Knee;Fall Precaution Comments: Reviewed knee precautions Required Braces or Orthoses: Knee Immobilizer - Right Knee Immobilizer - Right: On except when in CPM Restrictions Weight Bearing Restrictions: Yes RLE Weight Bearing: Weight bearing as tolerated    Mobility  Bed Mobility Overal bed mobility: Needs Assistance Bed Mobility: Sit to Supine       Sit to supine: Min assist   General bed mobility comments: Min assist managing Rt LE into bed and cues for proper sequencing.  HOB flat.  Transfers Overall transfer level: Needs assistance Equipment used: Rolling walker (2 wheeled) Transfers: Sit to/from Stand Sit to Stand: Min assist         General transfer comment: Min assist to stabilize RW.  Cues for proper positioning of RW and to reach back to bed when going to sit.  Ambulation/Gait Ambulation/Gait assistance: Min guard Ambulation Distance (Feet): 50 Feet Assistive device: Rolling walker (2 wheeled) Gait Pattern/deviations: Step-to pattern;Antalgic;Trunk flexed;Decreased weight shift to right;Decreased  stride length;Decreased stance time - right   Gait velocity interpretation: <1.8 ft/sec, indicative of risk for recurrent falls General Gait Details: Pt tolerating WB through Rt LE better this afternoon.  She still requires cues for upright posture.   Stairs            Wheelchair Mobility    Modified Rankin (Stroke Patients Only)       Balance Overall balance assessment: Needs assistance Sitting-balance support: Feet supported;Bilateral upper extremity supported Sitting balance-Leahy Scale: Fair     Standing balance support: Bilateral upper extremity supported;During functional activity Standing balance-Leahy Scale: Fair                      Cognition Arousal/Alertness: Awake/alert Behavior During Therapy: WFL for tasks assessed/performed Overall Cognitive Status: Within Functional Limits for tasks assessed                      Exercises Total Joint Exercises Ankle Circles/Pumps: AROM;Both;10 reps;Supine Quad Sets: Strengthening;Both;10 reps;Supine Heel Slides: AAROM;Right;5 reps;Supine Goniometric ROM: 2-75     General Comments General comments (skin integrity, edema, etc.): Pt w/ urinary incontinence on the way to the bathroom w/ nurse tech upon PT arrival  Notified RN that it will be best to bring [facility] to bedside to prevent pt soiling her KI and clothing.  Additionally, noted abrasion wound (likely from previously too large KI) on inner Rt thigh and showed RN who reported she would address it.        Pertinent Vitals/Pain Pain Assessment: 0-10 Pain Score: 8  Pain Location: Rt knee Pain Descriptors / Indicators: Aching;Grimacing;Constant Pain Intervention(s):  Limited activity within patient's tolerance;Monitored during session;Repositioned    Home Living                      Prior Function            PT Goals (current goals can now be found in the care plan section) Acute Rehab PT Goals Patient Stated Goal: to get stronger and go  to rehab before home PT Goal Formulation: With patient/family Time For Goal Achievement: 04/21/15 Potential to Achieve Goals: Good Progress towards PT goals: Progressing toward goals    Frequency  7X/week    PT Plan Current plan remains appropriate    Co-evaluation             End of Session Equipment Utilized During Treatment: Gait belt;Right knee immobilizer Activity Tolerance: Patient limited by pain;Patient limited by fatigue Patient left: with call bell/phone within reach;in bed;with nursing/sitter in room     Time: 5670-1410 PT Time Calculation (min) (ACUTE ONLY): 23 min  Charges:  $Gait Training: 8-22 mins $Therapeutic Exercise: 8-22 mins                    G Codes:      Joslyn Hy PT, [place] [phone] Pager: [phone] 04/16/2015, 4:40 PM

## 2015-04-16 NOTE — Progress Notes (Signed)
     Subjective:  Patient reports pain as moderate.  Got up with PT and is wiped out now.    Objective:   VITALS:   Filed Vitals:   04/15/15 1300 04/15/15 1400 04/15/15 2138 04/16/15 0655  BP: 119/42 136/46 124/51 132/50  Pulse: 84 82 69 75  Temp: 98.9 F (37.2 C) 97.9 F (36.6 C) 98.4 F (36.9 C) 98.2 F (36.8 C)  TempSrc: Oral Oral Oral   Resp: 16 16 16 16   SpO2: 95% 99% 97% 97%    Neurologically intact Dorsiflexion/Plantar flexion intact Incision: dressing C/D/I   Lab Results  Component Value Date   WBC 10.8* 04/16/2015   HGB 9.0* 04/16/2015   HCT 27.8* 04/16/2015   MCV 91.1 04/16/2015   PLT 146* 04/16/2015   BMET    Component Value Date/Time   NA 135 04/15/2015 0525   K 4.7 04/15/2015 0525   CL 102 04/15/2015 0525   CO2 28 04/15/2015 0525   GLUCOSE 109* 04/15/2015 0525   BUN 8 04/15/2015 0525   CREATININE 1.26* 04/15/2015 0525   CALCIUM 8.3* 04/15/2015 0525   GFRNONAA 41* 04/15/2015 0525   GFRAA 47* 04/15/2015 0525     Assessment/Plan: 2 Days Post-Op   Principal Problem:   Osteoarthritis of right knee, primary localized Active Problems:   Knee osteoarthritis   Advance diet Up with therapy Plan for discharge tomorrow Discharge to SNF ABLA, observe.   LANDAU,JOSHUA P 04/16/2015, 1:27 PM   Marchia Bond, MD Cell [phone]

## 2015-04-16 NOTE — Progress Notes (Signed)
OT Cancellation Note  Patient Details Name: Kathryn Ortiz MRN: [id number] DOB: August 29, 1940   Cancelled Treatment:    Reason Eval/Treat Not Completed: Other (comment) Pt is Medicare and current D/C plan is SNF. No apparent immediate acute care OT needs, therefore will defer OT to SNF. If OT eval is needed please call Acute Rehab Dept. at [phone] or text page OT at [phone].  [place], OTR/L  [phone] 04/16/2015 04/16/2015, 5:57 AM

## 2015-04-16 NOTE — Care Management Note (Signed)
Case Management Note  Patient Details  Name: Kathryn Ortiz MRN: [id number] Date of Birth: 1940/10/25  Subjective/Objective:      R TKA 04/14/2015. Plan is for pt to D/C to [facility] 04/17/2015.              Action/Plan:No further Discharge needs at present.    Expected Discharge Date:                  Expected Discharge Plan:  [place]  In-House Referral:     Discharge planning Services  CM Consult  Post Acute Care Choice:    Choice offered to:     DME Arranged:    DME Agency:     HH Arranged:    [place] Agency:     Status of Service:  In process, will continue to follow  Medicare Important Message Given:    Date Medicare IM Given:    Medicare IM give by:    Date Additional Medicare IM Given:    Additional Medicare Important Message give by:     If discussed at [place] of Stay Meetings, dates discussed:    Additional Comments:  Delrae Sawyers, RN 04/16/2015, 4:26 PM

## 2015-04-17 DIAGNOSIS — K219 Gastro-esophageal reflux disease without esophagitis: Secondary | ICD-10-CM | POA: Diagnosis not present

## 2015-04-17 DIAGNOSIS — M6281 Muscle weakness (generalized): Secondary | ICD-10-CM | POA: Diagnosis not present

## 2015-04-17 DIAGNOSIS — R2681 Unsteadiness on feet: Secondary | ICD-10-CM | POA: Diagnosis not present

## 2015-04-17 DIAGNOSIS — I10 Essential (primary) hypertension: Secondary | ICD-10-CM | POA: Diagnosis not present

## 2015-04-17 DIAGNOSIS — D62 Acute posthemorrhagic anemia: Secondary | ICD-10-CM | POA: Diagnosis not present

## 2015-04-17 DIAGNOSIS — E785 Hyperlipidemia, unspecified: Secondary | ICD-10-CM | POA: Diagnosis not present

## 2015-04-17 DIAGNOSIS — R488 Other symbolic dysfunctions: Secondary | ICD-10-CM | POA: Diagnosis not present

## 2015-04-17 DIAGNOSIS — Z471 Aftercare following joint replacement surgery: Secondary | ICD-10-CM | POA: Diagnosis not present

## 2015-04-17 DIAGNOSIS — M1990 Unspecified osteoarthritis, unspecified site: Secondary | ICD-10-CM | POA: Diagnosis not present

## 2015-04-17 DIAGNOSIS — F419 Anxiety disorder, unspecified: Secondary | ICD-10-CM | POA: Diagnosis not present

## 2015-04-17 DIAGNOSIS — F329 Major depressive disorder, single episode, unspecified: Secondary | ICD-10-CM | POA: Diagnosis not present

## 2015-04-17 DIAGNOSIS — Z96651 Presence of right artificial knee joint: Secondary | ICD-10-CM | POA: Diagnosis not present

## 2015-04-17 LAB — CBC
HCT: 27.8 % — ABNORMAL LOW (ref 36.0–46.0)
Hemoglobin: 9.1 g/dL — ABNORMAL LOW (ref 12.0–15.0)
MCH: 29.9 pg (ref 26.0–34.0)
MCHC: 32.7 g/dL (ref 30.0–36.0)
MCV: 91.4 fL (ref 78.0–100.0)
PLATELETS: 159 10*3/uL (ref 150–400)
RBC: 3.04 MIL/uL — AB (ref 3.87–5.11)
RDW: 13.5 % (ref 11.5–15.5)
WBC: 9.6 10*3/uL (ref 4.0–10.5)

## 2015-04-17 NOTE — Care Management Important Message (Signed)
Important Message  Patient Details  Name: Kathryn Ortiz MRN: [id number] Date of Birth: Apr 17, 1941   Medicare Important Message Given:  Yes-second notification given    Loann Quill 04/17/2015, 11:12 AM

## 2015-04-17 NOTE — Discharge Planning (Signed)
Patient to be discharged to [facility]. Patient updated regarding discharge.  Facility: [facility] RN report number: [phone] Transportation: EMS  Lubertha Sayres, [place] [phone]) and Surgical [phone])

## 2015-04-17 NOTE — Progress Notes (Signed)
Called report to [employer]. Gave report to RN

## 2015-04-17 NOTE — Progress Notes (Signed)
Patient ID: Kathryn Ortiz, female   DOB: 03/20/1941, 74 y.o.   MRN: [id number]     Subjective:  Patient reports pain as mild to moderate.  Patient states that she is ready to go to SNF  Objective:   VITALS:   Filed Vitals:   04/16/15 0655 04/16/15 1300 04/16/15 2149 04/17/15 0404  BP: 132/50 120/48 171/68 128/67  Pulse: 75 89 88 78  Temp: 98.2 F (36.8 C) 97.5 F (36.4 C) 100 F (37.8 C) 99.4 F (37.4 C)  TempSrc:  Oral Oral Oral  Resp: 16 16 16 16   SpO2: 97% 97% 92% 93%    ABD soft Sensation intact distally Dorsiflexion/Plantar flexion intact Incision: dressing C/D/I and no drainage   Lab Results  Component Value Date   WBC 9.6 04/17/2015   HGB 9.1* 04/17/2015   HCT 27.8* 04/17/2015   MCV 91.4 04/17/2015   PLT 159 04/17/2015   BMET    Component Value Date/Time   NA 135 04/15/2015 0525   K 4.7 04/15/2015 0525   CL 102 04/15/2015 0525   CO2 28 04/15/2015 0525   GLUCOSE 109* 04/15/2015 0525   BUN 8 04/15/2015 0525   CREATININE 1.26* 04/15/2015 0525   CALCIUM 8.3* 04/15/2015 0525   GFRNONAA 41* 04/15/2015 0525   GFRAA 47* 04/15/2015 0525     Assessment/Plan: 3 Days Post-Op   Principal Problem:   Osteoarthritis of right knee, primary localized Active Problems:   Knee osteoarthritis   Advance diet Up with therapy Discharge to SNF Follow up in 2 weeks with Dr Gaye Alken Dry dressing PRN   Remonia Richter 04/17/2015, 7:24 AM  SNF today.   Marchia Bond, MD Cell [phone]

## 2015-04-17 NOTE — Discharge Summary (Signed)
Physician Discharge Summary  Patient ID: Kathryn Ortiz MRN: [id number] DOB/AGE: 74-30-1942 74 y.o.  Admit date: 04/14/2015 Discharge date: 04/17/2015  Admission Diagnoses:  Osteoarthritis of right knee  Discharge Diagnoses:  Principal Problem:   Osteoarthritis of right knee, primary localized Active Problems:   Knee osteoarthritis   Past Medical History  Diagnosis Date  . GERD (gastroesophageal reflux disease)     takes Omeprazole daily  . Insomnia     takes Trazodone nightly  . Hyperlipidemia     takes Lovastatin 2 times a week  . Anxiety     takes Clonazepam daily as needed  . Depression     takes Celexa daily  . Hypertension     was on meds but has been off x 2 yr  . Weakness     tingling in hands and feet  . Arthritis   . Joint pain   . Joint swelling   . Back pain     from knee pain  . Urinary frequency   . Sciatica   . Macular degeneration     dry   . Osteoarthritis of right knee, primary localized 04/14/2015    Surgeries: Procedure(s): TOTAL KNEE ARTHROPLASTY on 04/14/2015   Consultants (if any):    Discharged Condition: Improved  Hospital Course: Kathryn Ortiz is an 74 y.o. female who was admitted 04/14/2015 with a diagnosis of Osteoarthritis of right knee and went to the operating room on 04/14/2015 and underwent the above named procedures.    She was given perioperative antibiotics:  Anti-infectives    Start     Dose/Rate Route Frequency Ordered Stop   04/14/15 1400  ceFAZolin (ANCEF) IVPB 2 g/50 mL premix     2 g 100 mL/hr over 30 Minutes Intravenous Every 6 hours 04/14/15 1259 04/14/15 2108   04/14/15 0545  ceFAZolin (ANCEF) IVPB 2 g/50 mL premix     2 g 100 mL/hr over 30 Minutes Intravenous On call to O.R. 04/14/15 0737 04/14/15 0735    .  She was given sequential compression devices, early ambulation, and xarelto for DVT prophylaxis.  She benefited maximally from the hospital stay and there were no complications.    Recent vital signs:   Filed Vitals:   04/17/15 0404  BP: 128/67  Pulse: 78  Temp: 99.4 F (37.4 C)  Resp: 16    Recent laboratory studies:  Lab Results  Component Value Date   HGB 9.1* 04/17/2015   HGB 9.0* 04/16/2015   HGB 10.0* 04/15/2015   Lab Results  Component Value Date   WBC 9.6 04/17/2015   PLT 159 04/17/2015   Lab Results  Component Value Date   INR 1.0 05/30/2008   Lab Results  Component Value Date   NA 135 04/15/2015   K 4.7 04/15/2015   CL 102 04/15/2015   CO2 28 04/15/2015   BUN 8 04/15/2015   CREATININE 1.26* 04/15/2015   GLUCOSE 109* 04/15/2015    Discharge Medications:     Medication List    STOP taking these medications        HYDROcodone-acetaminophen 7.5-325 MG per tablet  Commonly known as:  NORCO      TAKE these medications        baclofen 10 MG tablet  Commonly known as:  LIORESAL  Take 1 tablet (10 mg total) by mouth 3 (three) times daily. As needed for muscle spasm     citalopram 10 MG tablet  Commonly known as:  CELEXA  Take 10  mg by mouth every other day.     clonazePAM 1 MG tablet  Commonly known as:  KLONOPIN  Take 0.5 mg by mouth daily as needed for anxiety.     folic acid 1 MG tablet  Commonly known as:  FOLVITE  Take 1 mg by mouth daily.     gabapentin 300 MG capsule  Commonly known as:  NEURONTIN  Take 300 mg by mouth at bedtime.     lovastatin 10 MG tablet  Commonly known as:  MEVACOR  Take 10 mg by mouth 2 (two) times a week.     omeprazole 20 MG capsule  Commonly known as:  PRILOSEC  Take 20 mg by mouth daily.     ondansetron 4 MG tablet  Commonly known as:  ZOFRAN  Take 1 tablet (4 mg total) by mouth every 8 (eight) hours as needed for nausea or vomiting.     oxyCODONE-acetaminophen 5-325 MG per tablet  Commonly known as:  ROXICET  Take 1-2 tablets by mouth every 6 (six) hours as needed for severe pain.     rivaroxaban 10 MG Tabs tablet  Commonly known as:  XARELTO  Take 1 tablet (10 mg total) by mouth daily.      sennosides-docusate sodium 8.6-50 MG tablet  Commonly known as:  SENOKOT-S  Take 2 tablets by mouth daily.     traZODone 100 MG tablet  Commonly known as:  DESYREL  Take 250 mg by mouth at bedtime.     VITAMIN B12 PO  Take 1 tablet by mouth daily.     VITAMIN D PO  Take 1 tablet by mouth daily.        Diagnostic Studies: Dg Knee Right Port  04/14/2015   CLINICAL DATA:  Status post total knee arthroplasty. Osteoarthritis of the right knee.  EXAM: PORTABLE RIGHT KNEE - 1-2 VIEW  COMPARISON:  05/26/2014  FINDINGS: There are immediate postoperative changes of right knee total arthroplasty. The joint is aligned. No periprosthetic fracture or complicating feature is identified. Expected small effusion and locules of gas in the joint and surrounding soft tissues noted.  IMPRESSION: Immediate postoperative changes of right knee arthroplasty. No complicating features.   Electronically Signed   By: Curlene Dolphin M.D.   On: 04/14/2015 11:06    Disposition:         Follow-up Information    Follow up with Johnny Bridge, MD. Schedule an appointment as soon as possible for a visit in 2 weeks.   Specialty:  Orthopedic Surgery   Contact information:   [place] [address] [phone]        Signed: Johnny Bridge 04/17/2015, 7:34 AM

## 2015-04-17 NOTE — Progress Notes (Signed)
Physical Therapy Treatment Patient Details Name: Kathryn Ortiz MRN: [id number] DOB: 03-19-1941 Today's Date: 04/17/2015    History of Present Illness Pt is a 74 y/o F s/p Rt TKA.  Pt's PMH includes insomnia, hyperlipidemia, anxiety, depression, HTN, weakness and tingling in hands and feet, back pain, sciatica, urinary frequency, Bil shoulder surgery.    PT Comments    KI was not used this session and no knee buckle noted w/ ambulation 65 ft in hallway w/ min guard assist. She is anticipating d/c to SNF today.  Pt is making modest progress toward PT goals.   Follow Up Recommendations  SNF;Supervision for mobility/OOB     Equipment Recommendations   (has RW and cane at home; may need [facility] when leave SNF)    Recommendations for Other Services       Precautions / Restrictions Precautions Precautions: Knee;Fall Precaution Comments: Reviewed knee precautions Required Braces or Orthoses: Knee Immobilizer - Right Knee Immobilizer - Right: On except when in CPM Restrictions Weight Bearing Restrictions: Yes RLE Weight Bearing: Weight bearing as tolerated    Mobility  Bed Mobility Overal bed mobility: Modified Independent Bed Mobility: Supine to Sit     Supine to sit: Modified independent (Device/Increase time)     General bed mobility comments: Used bed rail w/ her HOB elevated and increased time.  No assist needed.  Transfers Overall transfer level: Needs assistance Equipment used: Rolling walker (2 wheeled) Transfers: Sit to/from Stand Sit to Stand: Min assist         General transfer comment: Min assist to stabilize RW.  Pt is slow to rise but demonstrates safe technique.  Ambulation/Gait Ambulation/Gait assistance: Min guard Ambulation Distance (Feet): 65 Feet Assistive device: Rolling walker (2 wheeled) Gait Pattern/deviations: Step-to pattern;Decreased weight shift to right;Decreased stride length;Decreased stance time - right;Antalgic;Trunk flexed   Gait  velocity interpretation: <1.8 ft/sec, indicative of risk for recurrent falls General Gait Details: She still requires cues for upright posture.  Additional cues for quad activation to maintain Rt knee extension as KI was not used this session.  No knee buckle noted.   Stairs            Wheelchair Mobility    Modified Rankin (Stroke Patients Only)       Balance Overall balance assessment: Needs assistance Sitting-balance support: Bilateral upper extremity supported;Feet supported Sitting balance-Leahy Scale: Good     Standing balance support: Bilateral upper extremity supported;During functional activity Standing balance-Leahy Scale: Fair Standing balance comment: Pt able to stand and wipe after using BSC                    Cognition Arousal/Alertness: Awake/alert Behavior During Therapy: WFL for tasks assessed/performed Overall Cognitive Status: Within Functional Limits for tasks assessed                      Exercises Total Joint Exercises Ankle Circles/Pumps: AROM;Both;10 reps;Supine Quad Sets: Strengthening;Both;10 reps;Supine Straight Leg Raises: AAROM;Right;10 reps;Supine Knee Flexion: AROM;AAROM;Right;5 reps;Seated Goniometric ROM: 5-79    General Comments        Pertinent Vitals/Pain Pain Assessment: 0-10 Pain Score: 9  Pain Location: Rt knee Pain Descriptors / Indicators: Aching;Constant Pain Intervention(s): Limited activity within patient's tolerance;Monitored during session;Repositioned;Utilized relaxation techniques    Home Living                      Prior Function            PT  Goals (current goals can now be found in the care plan section) Acute Rehab PT Goals Patient Stated Goal: to get stronger and go to rehab before home PT Goal Formulation: With patient/family Time For Goal Achievement: 04/21/15 Potential to Achieve Goals: Good Progress towards PT goals: Progressing toward goals    Frequency  7X/week     PT Plan Current plan remains appropriate    Co-evaluation             End of Session Equipment Utilized During Treatment: Gait belt Activity Tolerance: Patient limited by pain;Patient limited by fatigue Patient left: with call bell/phone within reach;in chair;with family/visitor present     Time: 7989-2119 PT Time Calculation (min) (ACUTE ONLY): 25 min  Charges:  $Gait Training: 8-22 mins $Therapeutic Exercise: 8-22 mins                    G Codes:      Joslyn Hy PT, [place] [phone] Pager: [phone] 04/17/2015, 11:31 AM

## 2015-04-17 NOTE — Clinical Social Work Placement (Signed)
   CLINICAL SOCIAL WORK PLACEMENT  NOTE  Date:  04/17/2015  Patient Details  Name: Kathryn Ortiz MRN: [id number] Date of Birth: [birth year]  Clinical Social Work is seeking post-discharge placement for this patient at the [place] level of care (*CSW will initial, date and re-position this form in  chart as items are completed):  Yes   Patient/family provided with [facility] Work Department's list of facilities offering this level of care within the geographic area requested by the patient (or if unable, by the patient's family).  Yes   Patient/family informed of their freedom to choose among providers that offer the needed level of care, that participate in Medicare, Medicaid or managed care program needed by the patient, have an available bed and are willing to accept the patient.  Yes   Patient/family informed of [facility]'s ownership interest in [facility] and [facility], as well as of the fact that they are under no obligation to receive care at these facilities.  PASRR submitted to EDS on 04/15/15     PASRR number received on 04/15/15     Existing PASRR number confirmed on  (n/a)     FL2 transmitted to all facilities in geographic area requested by pt/family on 04/15/15     FL2 transmitted to all facilities within larger geographic area on  (n/a)     Patient informed that his/her managed care company has contracts with or will negotiate with certain facilities, including the following:   (yes, [facility].)     Yes   Patient/family informed of bed offers received.  Patient chooses bed at [facility]     Physician recommends and patient chooses bed at      Patient to be transferred to [facility] on 04/17/15.  Patient to be transferred to facility by [facility]     Patient family notified on 04/17/15 of transfer.  Name of family member notified:  Patient at bedside.     PHYSICIAN        Additional Comment:    _______________________________________________ Caroline Sauger, LCSW 04/17/2015, 11:08 AM

## 2015-04-21 ENCOUNTER — Other Ambulatory Visit: Payer: Self-pay | Admitting: Internal Medicine

## 2015-04-21 DIAGNOSIS — N63 Unspecified lump in unspecified breast: Secondary | ICD-10-CM

## 2015-04-22 ENCOUNTER — Encounter: Payer: Self-pay | Admitting: Internal Medicine

## 2015-04-22 ENCOUNTER — Non-Acute Institutional Stay (SKILLED_NURSING_FACILITY): Payer: Medicare Other | Admitting: Internal Medicine

## 2015-04-22 DIAGNOSIS — D62 Acute posthemorrhagic anemia: Secondary | ICD-10-CM | POA: Diagnosis not present

## 2015-04-22 DIAGNOSIS — F419 Anxiety disorder, unspecified: Secondary | ICD-10-CM

## 2015-04-22 DIAGNOSIS — F329 Major depressive disorder, single episode, unspecified: Secondary | ICD-10-CM | POA: Diagnosis not present

## 2015-04-22 DIAGNOSIS — E785 Hyperlipidemia, unspecified: Secondary | ICD-10-CM

## 2015-04-22 DIAGNOSIS — F32A Depression, unspecified: Secondary | ICD-10-CM | POA: Insufficient documentation

## 2015-04-22 DIAGNOSIS — K219 Gastro-esophageal reflux disease without esophagitis: Secondary | ICD-10-CM | POA: Insufficient documentation

## 2015-04-22 DIAGNOSIS — Z96651 Presence of right artificial knee joint: Secondary | ICD-10-CM

## 2015-04-22 DIAGNOSIS — I10 Essential (primary) hypertension: Secondary | ICD-10-CM | POA: Diagnosis not present

## 2015-04-22 DIAGNOSIS — Z96659 Presence of unspecified artificial knee joint: Secondary | ICD-10-CM | POA: Insufficient documentation

## 2015-04-22 NOTE — Assessment & Plan Note (Signed)
SNF - Cont prn clonazepam

## 2015-04-22 NOTE — Progress Notes (Signed)
MRN: [id number] Name: Kathryn Ortiz  Sex: female Age: 74 y.o. DOB: 1940-08-25  [place] #: [facility] Facility/Room:109 Level Of Care: SNF Provider: Inocencio Homes D Emergency Contacts: Extended Emergency Contact Information Primary Emergency Contact: Winchell,Carles Address: [address] [place], [place] [address] Johnnette Litter of [place] Phone: [phone] Mobile Phone: [phone] Relation: Spouse  Code Status:   Allergies: Penicillins  Chief Complaint  Patient presents with  . New Admit To SNF    HPI: Patient is 74 y.o. female with hx HTN, HLD, GERD,arthritis, depression and anxiety who was hospitalized from 9/20-23 for R knee arthroplasty. No reported complications. Pt is admitted to SNF for OT/PT. While at [facility] pt will be followed for depression ,tx with celexa, HLD, tx with mevacor and GERD, tx with prilosec.  Past Medical History  Diagnosis Date  . Insomnia     takes Trazodone nightly  . Weakness     tingling in hands and feet  . Arthritis   . Joint pain   . Joint swelling   . Back pain     from knee pain  . Urinary frequency   . Sciatica   . Macular degeneration     dry   . Osteoarthritis of right knee, primary localized 04/14/2015  . GERD (gastroesophageal reflux disease)     takes Omeprazole daily  . Hyperlipidemia     takes Lovastatin 2 times a week  . Hypertension     was on meds but has been off x 2 yr  . Anxiety     takes Clonazepam daily as needed  . Depression     takes Celexa daily  . S/P total knee arthroplasty     R    Past Surgical History  Procedure Laterality Date  . Shoulder surgery Bilateral   . Colonoscopy    . Epidural injections    . Cataract surgery Bilateral   . Breast surgery      breast biopsy  . Total knee arthroplasty Right 01/12/2015  . Total knee arthroplasty Right 04/14/2015    Procedure: TOTAL KNEE ARTHROPLASTY;  Surgeon: Marchia Bond, MD;  Location: [place];  Service: Orthopedics;  Laterality:  Right;      Medication List       This list is accurate as of: 04/22/15 12:24 PM.  Always use your most recent med list.               baclofen 10 MG tablet  Commonly known as:  LIORESAL  Take 1 tablet (10 mg total) by mouth 3 (three) times daily. As needed for muscle spasm     citalopram 10 MG tablet  Commonly known as:  CELEXA  Take 10 mg by mouth every other day.     clonazePAM 1 MG tablet  Commonly known as:  KLONOPIN  Take 0.5 mg by mouth daily as needed for anxiety.     folic acid 1 MG tablet  Commonly known as:  FOLVITE  Take 1 mg by mouth daily.     gabapentin 300 MG capsule  Commonly known as:  NEURONTIN  Take 300 mg by mouth at bedtime.     lovastatin 10 MG tablet  Commonly known as:  MEVACOR  Take 10 mg by mouth 2 (two) times a week.     omeprazole 20 MG capsule  Commonly known as:  PRILOSEC  Take 20 mg by mouth daily.     ondansetron 4  MG tablet  Commonly known as:  ZOFRAN  Take 1 tablet (4 mg total) by mouth every 8 (eight) hours as needed for nausea or vomiting.     oxyCODONE-acetaminophen 5-325 MG tablet  Commonly known as:  ROXICET  Take 1-2 tablets by mouth every 6 (six) hours as needed for severe pain.     rivaroxaban 10 MG Tabs tablet  Commonly known as:  XARELTO  Take 1 tablet (10 mg total) by mouth daily.     sennosides-docusate sodium 8.6-50 MG tablet  Commonly known as:  SENOKOT-S  Take 2 tablets by mouth daily.     traZODone 100 MG tablet  Commonly known as:  DESYREL  Take 250 mg by mouth at bedtime.     VITAMIN B12 PO  Take 1 tablet by mouth daily.     VITAMIN D PO  Take 1 tablet by mouth daily.        No orders of the defined types were placed in this encounter.    There is no immunization history for the selected administration types on file for this patient.  Social History  Substance Use Topics  . Smoking status: Never Smoker   . Smokeless tobacco: Current User    Types: Chew  . Alcohol Use: No    Family  history is + HTN, breast CA  Review of Systems  DATA OBTAINED: from patient, granddaughter GENERAL:  no fevers, fatigue, appetite changes SKIN: No itching, rash or wounds EYES: No eye pain, redness, discharge EARS: No earache, tinnitus, change in hearing NOSE: No congestion, drainage or bleeding  MOUTH/THROAT: No mouth or tooth pain, No sore throat RESPIRATORY: No cough, wheezing, SOB CARDIAC: No chest pain, palpitations, lower extremity edema  GI: No abdominal pain, No N/V/D or constipation, No heartburn or reflux  GU: No dysuria, frequency or urgency, or incontinence  MUSCULOSKELETAL: admits pain in leg is not significantly improved;admits calf pain with walking NEUROLOGIC: No headache, dizziness or focal weakness PSYCHIATRIC: No c/o anxiety or sadness   Filed Vitals:   04/22/15 1140  BP: 114/55  Pulse: 82  Temp: 97.2 F (36.2 C)  Resp: 18    SpO2 Readings from Last 1 Encounters:  04/17/15 93%        Physical Exam  GENERAL APPEARANCE: Alert, conversant,  No acute distress.  SKIN: No diaphoresis rash, no erythema HEAD: Normocephalic, atraumatic  EYES: Conjunctiva/lids clear. Pupils round, reactive. EOMs intact.  EARS: External exam WNL, canals clear. Hearing grossly normal.  NOSE: No deformity or discharge.  MOUTH/THROAT: Lips w/o lesions  RESPIRATORY: Breathing is even, unlabored. Lung sounds are clear   CARDIOVASCULAR: Heart RRR no murmurs, rubs or gallops. No peripheral edema; swelling RLE a little more than expected s/p surgery 1 week, warmth to knee area more than expected, but no erythema; large bruising calf and thigh  GASTROINTESTINAL: Abdomen is soft, non-tender, not distended w/ normal bowel sounds. GENITOURINARY: Bladder non tender, not distended  MUSCULOSKELETAL:R knee arthroplasty NEUROLOGIC:  Cranial nerves 2-12 grossly intact. Moves all extremities  PSYCHIATRIC: Mood and affect appropriate to situation, no behavioral issues  Patient Active Problem  List   Diagnosis Date Noted  . Postoperative anemia due to acute blood loss 04/22/2015  . GERD (gastroesophageal reflux disease)   . Hyperlipidemia   . Hypertension   . Anxiety   . Depression   . S/P total knee arthroplasty   . Osteoarthritis of right knee, primary localized 04/14/2015  . Knee osteoarthritis 04/14/2015  . SYNCOPE AND COLLAPSE 02/23/2010  CBC    Component Value Date/Time   WBC 9.6 04/17/2015 0434   RBC 3.04* 04/17/2015 0434   HGB 9.1* 04/17/2015 0434   HCT 27.8* 04/17/2015 0434   PLT 159 04/17/2015 0434   MCV 91.4 04/17/2015 0434   LYMPHSABS 1.1 05/30/2008 1621   MONOABS 0.7 05/30/2008 1621   EOSABS 0.1 05/30/2008 1621   BASOSABS 0.0 05/30/2008 1621    CMP     Component Value Date/Time   NA 135 04/15/2015 0525   K 4.7 04/15/2015 0525   CL 102 04/15/2015 0525   CO2 28 04/15/2015 0525   GLUCOSE 109* 04/15/2015 0525   BUN 8 04/15/2015 0525   CREATININE 1.26* 04/15/2015 0525   CALCIUM 8.3* 04/15/2015 0525   PROT 6.3 09/21/2007 2155   ALBUMIN 3.7 09/21/2007 2155   AST 26 09/21/2007 2155   ALT 23 09/21/2007 2155   ALKPHOS 73 09/21/2007 2155   BILITOT 0.5 09/21/2007 2155   GFRNONAA 41* 04/15/2015 0525   GFRAA 47* 04/15/2015 0525    No results found for: HGBA1C   No results found.  Not all labs, radiology exams or other studies done during hospitalization come through on my EPIC note; however they are reviewed by me.    Assessment and Plan  S/P total knee arthroplasty On 0/86; no complications reported; SNF - admitted for OT/PT; today pt has swelling and warmth to extremity that is more than expected; RLE U/S ordered  Hypertension SNF - controlled on no meds  GERD (gastroesophageal reflux disease) SNF - stable;cont omeprazole 20 mg daily  Hyperlipidemia SNF -Stable - cont mevacor twice a week  Depression SNF - stable, cont celexa 20 mg daily  Anxiety SNF - Cont prn clonazepam  Postoperative anemia due to acute blood loss SNF -  d/c Hb 9.1;will check CBC to make sure it is rebounding; cont B12 and folate   Time spent > 35 min Hennie Duos, MD

## 2015-04-22 NOTE — Assessment & Plan Note (Signed)
SNF - stable;cont omeprazole 20 mg daily

## 2015-04-22 NOTE — Assessment & Plan Note (Signed)
SNF - d/c Hb 9.1;will check CBC to make sure it is rebounding; cont B12 and folate

## 2015-04-22 NOTE — Assessment & Plan Note (Signed)
SNF -Stable - cont mevacor twice a week

## 2015-04-22 NOTE — Assessment & Plan Note (Signed)
SNF - stable, cont celexa 20 mg daily

## 2015-04-22 NOTE — Assessment & Plan Note (Signed)
On 9/53; no complications reported; SNF - admitted for OT/PT; today pt has swelling and warmth to extremity that is more than expected; RLE U/S ordered

## 2015-04-22 NOTE — Assessment & Plan Note (Signed)
SNF - controlled on no meds

## 2015-04-27 ENCOUNTER — Other Ambulatory Visit: Payer: Medicare Other

## 2015-04-27 DIAGNOSIS — Z96651 Presence of right artificial knee joint: Secondary | ICD-10-CM | POA: Diagnosis not present

## 2015-04-29 ENCOUNTER — Other Ambulatory Visit: Payer: Medicare Other

## 2015-05-05 ENCOUNTER — Non-Acute Institutional Stay (SKILLED_NURSING_FACILITY): Payer: Medicare Other | Admitting: Internal Medicine

## 2015-05-05 ENCOUNTER — Encounter: Payer: Self-pay | Admitting: Internal Medicine

## 2015-05-05 DIAGNOSIS — F329 Major depressive disorder, single episode, unspecified: Secondary | ICD-10-CM

## 2015-05-05 DIAGNOSIS — I10 Essential (primary) hypertension: Secondary | ICD-10-CM

## 2015-05-05 DIAGNOSIS — E785 Hyperlipidemia, unspecified: Secondary | ICD-10-CM | POA: Diagnosis not present

## 2015-05-05 DIAGNOSIS — K219 Gastro-esophageal reflux disease without esophagitis: Secondary | ICD-10-CM | POA: Diagnosis not present

## 2015-05-05 DIAGNOSIS — Z96651 Presence of right artificial knee joint: Secondary | ICD-10-CM

## 2015-05-05 DIAGNOSIS — D62 Acute posthemorrhagic anemia: Secondary | ICD-10-CM | POA: Diagnosis not present

## 2015-05-05 DIAGNOSIS — F419 Anxiety disorder, unspecified: Secondary | ICD-10-CM | POA: Diagnosis not present

## 2015-05-05 DIAGNOSIS — F32A Depression, unspecified: Secondary | ICD-10-CM

## 2015-05-05 NOTE — Progress Notes (Signed)
MRN: [id number] Name: Kathryn Ortiz  Sex: female Age: 74 y.o. DOB: 14-Oct-1940  [place] #: [facility] Facility/Room:107 Level Of Care: SNF Provider: Inocencio Homes D Emergency Contacts: Extended Emergency Contact Information Primary Emergency Contact: Metzer,Carles Address: [address] [place], [place] [address] Johnnette Litter of [place] Phone: [phone] Mobile Phone: [phone] Relation: Spouse  Code Status: FULL  Allergies: Penicillins  Chief Complaint  Patient presents with  . Discharge Note    HPI: Patient is 74 y.o. female who was admitted to SNF for OT/PT after R knee arthroplasty. She is now ready to be d/c to home. She had an open area on R inner thigh when she arrived that has now resolved. There were no other issues.  Past Medical History  Diagnosis Date  . Insomnia     takes Trazodone nightly  . Weakness     tingling in hands and feet  . Arthritis   . Joint pain   . Joint swelling   . Back pain     from knee pain  . Urinary frequency   . Sciatica   . Macular degeneration     dry   . Osteoarthritis of right knee, primary localized 04/14/2015  . GERD (gastroesophageal reflux disease)     takes Omeprazole daily  . Hyperlipidemia     takes Lovastatin 2 times a week  . Hypertension     was on meds but has been off x 2 yr  . Anxiety     takes Clonazepam daily as needed  . Depression     takes Celexa daily  . S/P total knee arthroplasty     R    Past Surgical History  Procedure Laterality Date  . Shoulder surgery Bilateral   . Colonoscopy    . Epidural injections    . Cataract surgery Bilateral   . Breast surgery      breast biopsy  . Total knee arthroplasty Right 01/12/2015  . Total knee arthroplasty Right 04/14/2015    Procedure: TOTAL KNEE ARTHROPLASTY;  Surgeon: Marchia Bond, MD;  Location: [facility];  Service: Orthopedics;  Laterality: Right;      Medication List       This list is accurate as of: 05/05/15  3:53 PM.   Always use your most recent med list.               baclofen 10 MG tablet  Commonly known as:  LIORESAL  Take 1 tablet (10 mg total) by mouth 3 (three) times daily. As needed for muscle spasm     citalopram 10 MG tablet  Commonly known as:  CELEXA  Take 10 mg by mouth every other day.     clonazePAM 1 MG tablet  Commonly known as:  KLONOPIN  Take 0.5 mg by mouth daily as needed for anxiety.     folic acid 1 MG tablet  Commonly known as:  FOLVITE  Take 1 mg by mouth daily.     gabapentin 300 MG capsule  Commonly known as:  NEURONTIN  Take 300 mg by mouth at bedtime.     lovastatin 10 MG tablet  Commonly known as:  MEVACOR  Take 10 mg by mouth 2 (two) times a week.     omeprazole 20 MG capsule  Commonly known as:  PRILOSEC  Take 20 mg by mouth daily.     ondansetron 4 MG tablet  Commonly known as:  ZOFRAN  Take 1 tablet (4 mg total) by mouth every 8 (eight) hours as needed for nausea or vomiting.     oxycodone 5 MG capsule  Commonly known as:  OXY-IR  Take 5 mg by mouth every 4 (four) hours as needed. #20, 0RF     rivaroxaban 10 MG Tabs tablet  Commonly known as:  XARELTO  Take 1 tablet (10 mg total) by mouth daily.     sennosides-docusate sodium 8.6-50 MG tablet  Commonly known as:  SENOKOT-S  Take 2 tablets by mouth daily.     traZODone 100 MG tablet  Commonly known as:  DESYREL  Take 250 mg by mouth at bedtime.     VITAMIN B12 PO  Take 1 tablet by mouth daily.     VITAMIN D PO  Take 1 tablet by mouth daily.        Meds ordered this encounter  Medications  . oxycodone (OXY-IR) 5 MG capsule    Sig: Take 5 mg by mouth every 4 (four) hours as needed. #20, 0RF    There is no immunization history for the selected administration types on file for this patient.  Social History  Substance Use Topics  . Smoking status: Never Smoker   . Smokeless tobacco: Current User    Types: Chew  . Alcohol Use: No    Filed Vitals:   05/05/15 1534  BP: 133/69   Pulse: 109  Temp: 98.2 F (36.8 C)  Resp: 20    Physical Exam  GENERAL APPEARANCE: Alert, conversant. No acute distress.  HEENT: Unremarkable. RESPIRATORY: Breathing is even, unlabored. Lung sounds are clear   CARDIOVASCULAR: Heart RRR no murmurs, rubs or gallops. No peripheral edema.  GASTROINTESTINAL: Abdomen is soft, non-tender, not distended w/ normal bowel sounds.  NEUROLOGIC: Cranial nerves 2-12 grossly intact. Moves all extremities  Patient Active Problem List   Diagnosis Date Noted  . Postoperative anemia due to acute blood loss 04/22/2015  . GERD (gastroesophageal reflux disease)   . Hyperlipidemia   . Hypertension   . Anxiety   . Depression   . S/P total knee arthroplasty   . Osteoarthritis of right knee, primary localized 04/14/2015  . Knee osteoarthritis 04/14/2015  . SYNCOPE AND COLLAPSE 02/23/2010    CBC    Component Value Date/Time   WBC 9.6 04/17/2015 0434   RBC 3.04* 04/17/2015 0434   HGB 9.1* 04/17/2015 0434   HCT 27.8* 04/17/2015 0434   PLT 159 04/17/2015 0434   MCV 91.4 04/17/2015 0434   LYMPHSABS 1.1 05/30/2008 1621   MONOABS 0.7 05/30/2008 1621   EOSABS 0.1 05/30/2008 1621   BASOSABS 0.0 05/30/2008 1621    CMP     Component Value Date/Time   NA 135 04/15/2015 0525   K 4.7 04/15/2015 0525   CL 102 04/15/2015 0525   CO2 28 04/15/2015 0525   GLUCOSE 109* 04/15/2015 0525   BUN 8 04/15/2015 0525   CREATININE 1.26* 04/15/2015 0525   CALCIUM 8.3* 04/15/2015 0525   PROT 6.3 09/21/2007 2155   ALBUMIN 3.7 09/21/2007 2155   AST 26 09/21/2007 2155   ALT 23 09/21/2007 2155   ALKPHOS 73 09/21/2007 2155   BILITOT 0.5 09/21/2007 2155   GFRNONAA 41* 04/15/2015 0525   GFRAA 47* 04/15/2015 0525    Assessment and Plan  Pt is d/c to home with HH/OT/PT/nursing. DME- rolling walker.  Time spent 35 min;> 50% of time with patient was spent reviewing records, labs, tests and studies, counseling and developing plan of  care  Hennie Duos,  MD

## 2015-05-07 DIAGNOSIS — M6281 Muscle weakness (generalized): Secondary | ICD-10-CM | POA: Diagnosis not present

## 2015-05-07 DIAGNOSIS — Z96651 Presence of right artificial knee joint: Secondary | ICD-10-CM | POA: Diagnosis not present

## 2015-05-07 DIAGNOSIS — I10 Essential (primary) hypertension: Secondary | ICD-10-CM | POA: Diagnosis not present

## 2015-05-07 DIAGNOSIS — R2681 Unsteadiness on feet: Secondary | ICD-10-CM | POA: Diagnosis not present

## 2015-05-08 DIAGNOSIS — R2681 Unsteadiness on feet: Secondary | ICD-10-CM | POA: Diagnosis not present

## 2015-05-08 DIAGNOSIS — Z96651 Presence of right artificial knee joint: Secondary | ICD-10-CM | POA: Diagnosis not present

## 2015-05-08 DIAGNOSIS — I10 Essential (primary) hypertension: Secondary | ICD-10-CM | POA: Diagnosis not present

## 2015-05-08 DIAGNOSIS — M6281 Muscle weakness (generalized): Secondary | ICD-10-CM | POA: Diagnosis not present

## 2015-05-18 DIAGNOSIS — K5900 Constipation, unspecified: Secondary | ICD-10-CM | POA: Diagnosis not present

## 2015-05-18 DIAGNOSIS — M25569 Pain in unspecified knee: Secondary | ICD-10-CM | POA: Diagnosis not present

## 2015-05-18 DIAGNOSIS — I10 Essential (primary) hypertension: Secondary | ICD-10-CM | POA: Diagnosis not present

## 2015-05-18 DIAGNOSIS — F419 Anxiety disorder, unspecified: Secondary | ICD-10-CM | POA: Diagnosis not present

## 2015-05-26 DIAGNOSIS — Z96651 Presence of right artificial knee joint: Secondary | ICD-10-CM | POA: Diagnosis not present

## 2015-06-02 DIAGNOSIS — E559 Vitamin D deficiency, unspecified: Secondary | ICD-10-CM | POA: Diagnosis not present

## 2015-06-02 DIAGNOSIS — M25562 Pain in left knee: Secondary | ICD-10-CM | POA: Diagnosis not present

## 2015-06-02 DIAGNOSIS — R739 Hyperglycemia, unspecified: Secondary | ICD-10-CM | POA: Diagnosis not present

## 2015-06-02 DIAGNOSIS — I10 Essential (primary) hypertension: Secondary | ICD-10-CM | POA: Diagnosis not present

## 2015-07-08 DIAGNOSIS — Z96651 Presence of right artificial knee joint: Secondary | ICD-10-CM | POA: Diagnosis not present

## 2015-07-14 ENCOUNTER — Ambulatory Visit
Admission: RE | Admit: 2015-07-14 | Discharge: 2015-07-14 | Disposition: A | Discharge status: Home / Self Care | Payer: Medicare Other | Source: Ambulatory Visit | Attending: Internal Medicine | Admitting: Internal Medicine

## 2015-07-14 ENCOUNTER — Other Ambulatory Visit: Payer: Self-pay | Admitting: Internal Medicine

## 2015-07-14 DIAGNOSIS — N63 Unspecified lump in unspecified breast: Secondary | ICD-10-CM

## 2015-07-15 DIAGNOSIS — Z96651 Presence of right artificial knee joint: Secondary | ICD-10-CM | POA: Diagnosis not present

## 2015-07-28 ENCOUNTER — Ambulatory Visit
Admission: RE | Admit: 2015-07-28 | Discharge: 2015-07-28 | Disposition: A | Discharge status: Home / Self Care | Payer: Medicare Other | Source: Ambulatory Visit | Attending: Internal Medicine | Admitting: Internal Medicine

## 2015-07-28 DIAGNOSIS — N63 Unspecified lump in unspecified breast: Secondary | ICD-10-CM

## 2015-07-28 DIAGNOSIS — R739 Hyperglycemia, unspecified: Secondary | ICD-10-CM | POA: Diagnosis not present

## 2015-07-28 DIAGNOSIS — D0581 Other specified type of carcinoma in situ of right breast: Secondary | ICD-10-CM | POA: Diagnosis not present

## 2015-07-28 DIAGNOSIS — C50211 Malignant neoplasm of upper-inner quadrant of right female breast: Secondary | ICD-10-CM | POA: Diagnosis not present

## 2015-07-28 DIAGNOSIS — I10 Essential (primary) hypertension: Secondary | ICD-10-CM | POA: Diagnosis not present

## 2015-07-31 DIAGNOSIS — C50211 Malignant neoplasm of upper-inner quadrant of right female breast: Secondary | ICD-10-CM | POA: Diagnosis not present

## 2015-07-31 DIAGNOSIS — C50911 Malignant neoplasm of unspecified site of right female breast: Secondary | ICD-10-CM | POA: Diagnosis not present

## 2015-07-31 DIAGNOSIS — I10 Essential (primary) hypertension: Secondary | ICD-10-CM | POA: Diagnosis not present

## 2015-07-31 DIAGNOSIS — R05 Cough: Secondary | ICD-10-CM | POA: Diagnosis not present

## 2015-07-31 DIAGNOSIS — R739 Hyperglycemia, unspecified: Secondary | ICD-10-CM | POA: Diagnosis not present

## 2015-08-04 ENCOUNTER — Telehealth: Payer: Self-pay | Admitting: Hematology and Oncology

## 2015-08-04 NOTE — Telephone Encounter (Signed)
New breast appt-s/w patient dtr [place] and gave np appt for 01/16 @ 3:45 w/Dr. Lindi Adie Referring Dr. Autumn Messing   Referral information scanned

## 2015-08-05 ENCOUNTER — Telehealth: Payer: Self-pay | Admitting: *Deleted

## 2015-08-05 NOTE — Telephone Encounter (Signed)
Mailed new pt packet to pt.  

## 2015-08-10 ENCOUNTER — Encounter: Payer: Self-pay | Admitting: Hematology and Oncology

## 2015-08-10 ENCOUNTER — Ambulatory Visit (HOSPITAL_BASED_OUTPATIENT_CLINIC_OR_DEPARTMENT_OTHER): Payer: Medicare Other | Admitting: Hematology and Oncology

## 2015-08-10 ENCOUNTER — Encounter: Payer: Self-pay | Admitting: *Deleted

## 2015-08-10 VITALS — BP 154/62 | HR 91 | Temp 98.1°F | Resp 19 | Wt 148.5 lb

## 2015-08-10 DIAGNOSIS — C50211 Malignant neoplasm of upper-inner quadrant of right female breast: Secondary | ICD-10-CM | POA: Diagnosis not present

## 2015-08-10 MED ORDER — ANASTROZOLE 1 MG PO TABS: 1.0000 mg | ORAL_TABLET | Freq: Every day | ORAL | Status: DC

## 2015-08-10 NOTE — Assessment & Plan Note (Signed)
Right breast biopsy 2:00 on 07/28/2015: Invasive ductal carcinoma with DCIS, positive for perineural invasion, grade 1, ER 100%, PR 90%, HER-2 negative ratio 1.29, Ki-67 10% Right breast posterior spiculated mass 2:00 position 07/14/2015: 1.7 cm, multiple normal-sized right axillary lymph nodes with borderline diffuse cortical thickening, probable pectoral muscle invasion T1 cN0 stage IA  Pathology and radiology counseling: Discussed with the patient, the details of pathology including the type of breast cancer,the clinical staging, the significance of ER, PR and HER-2/neu receptors and the implications for treatment. After reviewing the pathology in detail, we proceeded to discuss the different treatment options between surgery, radiation, chemotherapy, antiestrogen therapies.  Recommendation: 1. Genetic counseling 2. Breast MRI 3. Neoadjuvant hormonal therapy for 6 months 4. Followed by surgery

## 2015-08-10 NOTE — Progress Notes (Signed)
[facility] CONSULT NOTE  Patient Care Team: Jani Gravel, MD as PCP - General (Internal Medicine)  CHIEF COMPLAINTS/PURPOSE OF CONSULTATION:  Newly diagnosed breast cancer  HISTORY OF PRESENTING ILLNESS:  Kathryn Ortiz 75 y.o. female is here because of recent diagnosis of right breast cancer. Patient's husband felt a lump in the right breast and instructed her to get tested. Because she had recent knee replacement surgery, she elected to wait on this for a few months until she saw her primary care physician. She then underwent a mammogram that revealed a spiculated mass in the right breast 2:00 position measuring 1.7 cm. She underwent a biopsy on 07/28/2015 which revealed invasive ductal carcinoma with DCIS that was positive for perineural invasion, grade 1, ER 100%, PR 90%, HER-2 negative, Ki-67 10%. She was seen by Dr. Marlou Starks who reviewed her mammogram and felt that the tumor was very close to the pectoral muscle. She was sent to me for discussion regarding neoadjuvant antiestrogen treatment options.  I reviewed her records extensively and collaborated the history with the patient.  SUMMARY OF ONCOLOGIC HISTORY:   Breast cancer of upper-inner quadrant of right female breast ([place])   07/14/2015 Mammogram Right breast posterior spiculated mass 2:00 position: 1.7 cm, multiple normal-sized right axillary lymph nodes with borderline diffuse cortical thickening, T1 cN0 stage IA   07/28/2015 Initial Diagnosis Right breast biopsy 2:00: Invasive ductal carcinoma with DCIS, positive for perineural invasion, grade 1, ER 100%, PR 90%, HER-2 negative ratio 1.29, Ki-67 10%    MEDICAL HISTORY:  Past Medical History  Diagnosis Date  . Insomnia     takes Trazodone nightly  . Weakness     tingling in hands and feet  . Arthritis   . Joint pain   . Joint swelling   . Back pain     from knee pain  . Urinary frequency   . Sciatica   . Macular degeneration     dry   . Osteoarthritis of right  knee, primary localized 04/14/2015  . GERD (gastroesophageal reflux disease)     takes Omeprazole daily  . Hyperlipidemia     takes Lovastatin 2 times a week  . Hypertension     was on meds but has been off x 2 yr  . Anxiety     takes Clonazepam daily as needed  . Depression     takes Celexa daily  . S/P total knee arthroplasty     R    SURGICAL HISTORY: Past Surgical History  Procedure Laterality Date  . Shoulder surgery Bilateral   . Colonoscopy    . Epidural injections    . Cataract surgery Bilateral   . Breast surgery      breast biopsy  . Total knee arthroplasty Right 01/12/2015  . Total knee arthroplasty Right 04/14/2015    Procedure: TOTAL KNEE ARTHROPLASTY;  Surgeon: Marchia Bond, MD;  Location: [facility];  Service: Orthopedics;  Laterality: Right;    SOCIAL HISTORY: Social History   Social History  . Marital Status: Married    Spouse Name: N/A  . Number of Children: N/A  . Years of Education: N/A   Occupational History  . Not on file.   Social History Main Topics  . Smoking status: Never Smoker   . Smokeless tobacco: Current User    Types: Chew  . Alcohol Use: No  . Drug Use: No  . Sexual Activity: Yes    Birth Control/ Protection: Post-menopausal   Other Topics Concern  .  Not on file   Social History Narrative    FAMILY HISTORY: Family History  Problem Relation Age of Onset  . Hypertension Other   . Cancer Other     breast    ALLERGIES:  is allergic to penicillins.  MEDICATIONS:  Current Outpatient Prescriptions  Medication Sig Dispense Refill  . baclofen (LIORESAL) 10 MG tablet Take 1 tablet (10 mg total) by mouth 3 (three) times daily. As needed for muscle spasm 50 tablet 0  . Cholecalciferol (VITAMIN D PO) Take 1 tablet by mouth daily.    . citalopram (CELEXA) 10 MG tablet Take 10 mg by mouth every other day.    . clonazePAM (KLONOPIN) 1 MG tablet Take 0.5 mg by mouth daily as needed for anxiety.    . Cyanocobalamin (VITAMIN B12 PO)  Take 1 tablet by mouth daily.    . folic acid (FOLVITE) 1 MG tablet Take 1 mg by mouth daily.    Marland Kitchen gabapentin (NEURONTIN) 300 MG capsule Take 300 mg by mouth at bedtime.    . lovastatin (MEVACOR) 10 MG tablet Take 10 mg by mouth 2 (two) times a week.    Marland Kitchen omeprazole (PRILOSEC) 20 MG capsule Take 20 mg by mouth daily.    . ondansetron (ZOFRAN) 4 MG tablet Take 1 tablet (4 mg total) by mouth every 8 (eight) hours as needed for nausea or vomiting. 30 tablet 0  . oxycodone (OXY-IR) 5 MG capsule Take 5 mg by mouth every 4 (four) hours as needed. #20, 0RF    . rivaroxaban (XARELTO) 10 MG TABS tablet Take 1 tablet (10 mg total) by mouth daily. 21 tablet 0  . sennosides-docusate sodium (SENOKOT-S) 8.6-50 MG tablet Take 2 tablets by mouth daily. 30 tablet 1  . traZODone (DESYREL) 100 MG tablet Take 250 mg by mouth at bedtime.     No current facility-administered medications for this visit.    REVIEW OF SYSTEMS:   Constitutional: Denies fevers, chills or abnormal night sweats Eyes: Denies blurriness of vision, double vision or watery eyes Ears, nose, mouth, throat, and face: Denies mucositis or sore throat Respiratory: Denies cough, dyspnea or wheezes Cardiovascular: Denies palpitation, chest discomfort or lower extremity swelling Gastrointestinal:  Denies nausea, heartburn or change in bowel habits Skin: Denies abnormal skin rashes Lymphatics: Denies new lymphadenopathy or easy bruising Neurological:Denies numbness, tingling or new weaknesses Behavioral/Psych: Mood is stable, no new changes  Breast: lump in the right breast as well as redness involving the chest wall causing itching All other systems were reviewed with the patient and are negative.  PHYSICAL EXAMINATION: ECOG PERFORMANCE STATUS: 1 - Symptomatic but completely ambulatory  Filed Vitals:   08/10/15 1540  BP: 154/62  Pulse: 91  Temp: 98.1 F (36.7 C)  Resp: 19   Filed Weights   08/10/15 1540  Weight: 148 lb 8 oz (67.359  kg)    GENERAL:alert, no distress and comfortable SKIN: skin color, texture, turgor are normal, no rashes or significant lesions EYES: normal, conjunctiva are pink and non-injected, sclera clear OROPHARYNX:no exudate, no erythema and lips, buccal mucosa, and tongue normal  NECK: supple, thyroid normal size, non-tender, without nodularity LYMPH:  no palpable lymphadenopathy in the cervical, axillary or inguinal LUNGS: clear to auscultation and percussion with normal breathing effort HEART: regular rate & rhythm and no murmurs and no lower extremity edema ABDOMEN:abdomen soft, non-tender and normal bowel sounds Musculoskeletal:no cyanosis of digits and no clubbing  PSYCH: alert & oriented x 3 with fluent speech NEURO: no  focal motor/sensory deficits BREAST:palpable right breast mass superior aspect of the breast with redness extending both sides of the chest wall and underneath the breast it is macular in nature.. No palpable axillary or supraclavicular lymphadenopathy (exam performed in the presence of a chaperone)   LABORATORY DATA:  I have reviewed the data as listed Lab Results  Component Value Date   WBC 9.6 04/17/2015   HGB 9.1* 04/17/2015   HCT 27.8* 04/17/2015   MCV 91.4 04/17/2015   PLT 159 04/17/2015   Lab Results  Component Value Date   NA 135 04/15/2015   K 4.7 04/15/2015   CL 102 04/15/2015   CO2 28 04/15/2015    RADIOGRAPHIC STUDIES: I have personally reviewed the radiological reports and agreed with the findings in the report.  ASSESSMENT AND PLAN:  Breast cancer of upper-inner quadrant of right female breast ([place]) Right breast biopsy 2:00 on 07/28/2015: Invasive ductal carcinoma with DCIS, positive for perineural invasion, grade 1, ER 100%, PR 90%, HER-2 negative ratio 1.29, Ki-67 10% Right breast posterior spiculated mass 2:00 position 07/14/2015: 1.7 cm, multiple normal-sized right axillary lymph nodes with borderline diffuse cortical thickening, probable  pectoral muscle invasion T1 cN0 stage IA  Pathology and radiology counseling: Discussed with the patient, the details of pathology including the type of breast cancer,the clinical staging, the significance of ER, PR and HER-2/neu receptors and the implications for treatment. After reviewing the pathology in detail, we proceeded to discuss the different treatment options between surgery, radiation, chemotherapy, antiestrogen therapies.  Recommendation: 1. Genetic counseling 2. Breast MRI 3. Neoadjuvant hormonal therapy for 6 months 4. Followed by surgery  If the breast MRI does not show pectoral muscle involvement, she may undergo lumpectomy upfront with the neoadjuvant antiestrogen therapy. However if the MRI does show pectoral muscle involvement then she will continue the neoadjuvant antiestrogen therapy with anastrozole.  Anastrozole counseling: We discussed the risks and benefits of anti-estrogen therapy with aromatase inhibitors. These include but not limited to insomnia, hot flashes, mood changes, vaginal dryness, bone density loss, and weight gain. We strongly believe that the benefits far outweigh the risks. Patient understands these risks and consented to starting treatment. Planned treatment duration is 5 years.  All questions were answered. The patient knows to call the clinic with any problems, questions or concerns.    Rulon Eisenmenger, MD 08/10/2015

## 2015-08-11 NOTE — Progress Notes (Signed)
Note created by Dr. Gudena during office visit, copy to patient,original to scan. 

## 2015-08-12 ENCOUNTER — Telehealth: Payer: Self-pay | Admitting: Hematology and Oncology

## 2015-08-12 DIAGNOSIS — H11153 Pinguecula, bilateral: Secondary | ICD-10-CM | POA: Diagnosis not present

## 2015-08-12 DIAGNOSIS — H353131 Nonexudative age-related macular degeneration, bilateral, early dry stage: Secondary | ICD-10-CM | POA: Diagnosis not present

## 2015-08-12 NOTE — Telephone Encounter (Signed)
Left message for patient re 1/31 f/u - 2/9 bone density at the [facility] (1st available) and calling into [facility] for mri appointment - patient must call to answer screening questions and appointments will be scheduled with patient. Schedule mailed.

## 2015-08-17 DIAGNOSIS — H02831 Dermatochalasis of right upper eyelid: Secondary | ICD-10-CM | POA: Diagnosis not present

## 2015-08-17 DIAGNOSIS — H35341 Macular cyst, hole, or pseudohole, right eye: Secondary | ICD-10-CM | POA: Diagnosis not present

## 2015-08-17 DIAGNOSIS — H26492 Other secondary cataract, left eye: Secondary | ICD-10-CM | POA: Diagnosis not present

## 2015-08-17 DIAGNOSIS — Z961 Presence of intraocular lens: Secondary | ICD-10-CM | POA: Diagnosis not present

## 2015-08-17 DIAGNOSIS — H02834 Dermatochalasis of left upper eyelid: Secondary | ICD-10-CM | POA: Diagnosis not present

## 2015-08-21 ENCOUNTER — Ambulatory Visit
Admission: RE | Admit: 2015-08-21 | Discharge: 2015-08-21 | Disposition: A | Discharge status: Home / Self Care | Payer: Medicare Other | Source: Ambulatory Visit | Attending: Hematology and Oncology | Admitting: Hematology and Oncology

## 2015-08-21 DIAGNOSIS — C50911 Malignant neoplasm of unspecified site of right female breast: Secondary | ICD-10-CM | POA: Diagnosis not present

## 2015-08-21 DIAGNOSIS — C50211 Malignant neoplasm of upper-inner quadrant of right female breast: Secondary | ICD-10-CM

## 2015-08-21 MED ORDER — GADOBENATE DIMEGLUMINE 529 MG/ML IV SOLN
7.0000 mL | Freq: Once | INTRAVENOUS | Status: AC | PRN
  Administered 2015-08-21: 7 mL via INTRAVENOUS

## 2015-08-25 ENCOUNTER — Encounter: Payer: Self-pay | Admitting: Hematology and Oncology

## 2015-08-25 ENCOUNTER — Ambulatory Visit (HOSPITAL_BASED_OUTPATIENT_CLINIC_OR_DEPARTMENT_OTHER): Payer: Medicare Other | Admitting: Hematology and Oncology

## 2015-08-25 VITALS — BP 138/70 | HR 80 | Temp 98.1°F | Resp 18 | Ht 59.0 in | Wt 140.6 lb

## 2015-08-25 DIAGNOSIS — C50211 Malignant neoplasm of upper-inner quadrant of right female breast: Secondary | ICD-10-CM

## 2015-08-25 NOTE — Assessment & Plan Note (Signed)
Right breast biopsy 2:00 on 07/28/2015: Invasive ductal carcinoma with DCIS, positive for perineural invasion, grade 1,  ER 100%, PR 90%, HER-2 negative ratio 1.29, Ki-67 10% Right breast posterior spiculated mass 2:00 position 07/14/2015: 1.7 cm, multiple normal-sized right axillary lymph nodes with borderline diffuse cortical thickening, probable pectoral muscle invasion T1 cN0 stage IA Breast MRI 08/24/2015: Right breast mass 2.4 x 1.8 x 1.6 cm, the enhancement does not appear to abut or invade the adjacent pectoralis muscle  Clinical stage: T2 N0 stage II a  Recommendation: 1. Based on the MRI breast results, there is no concern for pectoralis invasion. So the patient may undergo BCS upfront. 2. RTC after surgery to discuss the final path report 3. Patient will need adjuvant XRT 4. Foll by Anti-estrogen therapy   We might consider sending for Oncotype DX testing to determine benefit from chemotherapy

## 2015-08-25 NOTE — Progress Notes (Signed)
Unable to get in to exam room prior to MD.  No assessment performed.  

## 2015-08-25 NOTE — Progress Notes (Signed)
Patient Care Team: Jani Gravel, MD as PCP - General (Internal Medicine)  DIAGNOSIS: No matching staging information was found for the patient.  SUMMARY OF ONCOLOGIC HISTORY:   Breast cancer of upper-inner quadrant of right female breast ([employer])   07/14/2015 Mammogram Right breast posterior spiculated mass 2:00 position: 1.7 cm, multiple normal-sized right axillary lymph nodes with borderline diffuse cortical thickening, T1 cN0 stage IA   07/28/2015 Initial Diagnosis Right breast biopsy 2:00: Invasive ductal carcinoma with DCIS, positive for perineural invasion, grade 1, ER 100%, PR 90%, HER-2 negative ratio 1.29, Ki-67 10%   08/24/2015 Breast MRI Right breast mass 2.4 x 1.8 x 1.6 cm, the enhancement does not appear to abut or invade the adjacent pectoralis muscle    CHIEF COMPLIANT: follow-up to discuss breast MRI result  INTERVAL HISTORY: Kathryn Ortiz is a 74 year old with above-mentioned history right breast cancer who underwent a breast MRI and is here today to discuss the results. It appears that the tumor is not invading or abutting the pectoralis muscle. It appeared to be slightly bigger than what was detected through the mammogram. She is accompanied by her husband and is interested to know what the decisions would be regarding her treatment plan. There are 2 things that the patient does not want to do. She does not want chemotherapy and she does not want mastectomy.  REVIEW OF SYSTEMS:   Constitutional: Denies fevers, chills or abnormal weight loss Eyes: Denies blurriness of vision Ears, nose, mouth, throat, and face: Denies mucositis or sore throat Respiratory: Denies cough, dyspnea or wheezes Cardiovascular: Denies palpitation, chest discomfort Gastrointestinal:  Denies nausea, heartburn or change in bowel habits Skin: Denies abnormal skin rashes Lymphatics: Denies new lymphadenopathy or easy bruising Neurological:Denies numbness, tingling or new weaknesses Behavioral/Psych: Mood  is stable, no new changes  Extremities: No lower extremity edema Breast:  denies any pain or lumps or nodules in either breasts All other systems were reviewed with the patient and are negative.  I have reviewed the past medical history, past surgical history, social history and family history with the patient and they are unchanged from previous note.  ALLERGIES:  is allergic to penicillins.  MEDICATIONS:  Current Outpatient Prescriptions  Medication Sig Dispense Refill  . anastrozole (ARIMIDEX) 1 MG tablet Take 1 tablet (1 mg total) by mouth daily. 90 tablet 3  . Cholecalciferol (VITAMIN D PO) Take 1 tablet by mouth daily.    . citalopram (CELEXA) 10 MG tablet Take 10 mg by mouth every other day.    . clonazePAM (KLONOPIN) 1 MG tablet Take 0.5 mg by mouth daily as needed for anxiety.    . Cyanocobalamin (VITAMIN B12 PO) Take 1 tablet by mouth daily.    Marland Kitchen gabapentin (NEURONTIN) 300 MG capsule Take 300 mg by mouth at bedtime.    . lovastatin (MEVACOR) 10 MG tablet Take 10 mg by mouth 2 (two) times a week.    Marland Kitchen omeprazole (PRILOSEC) 20 MG capsule Take 20 mg by mouth daily.    Marland Kitchen oxycodone (OXY-IR) 5 MG capsule Take 5 mg by mouth every 4 (four) hours as needed. #20, 0RF    . traZODone (DESYREL) 100 MG tablet Take 250 mg by mouth at bedtime.     No current facility-administered medications for this visit.    PHYSICAL EXAMINATION: ECOG PERFORMANCE STATUS: 1 - Symptomatic but completely ambulatory  Filed Vitals:   08/25/15 1457  BP: 138/70  Pulse: 80  Temp: 98.1 F (36.7 C)  Resp: 18  Filed Weights   08/25/15 1457  Weight: 140 lb 9.6 oz (63.776 kg)    GENERAL:alert, no distress and comfortable SKIN: skin color, texture, turgor are normal, no rashes or significant lesions EYES: normal, Conjunctiva are pink and non-injected, sclera clear OROPHARYNX:no exudate, no erythema and lips, buccal mucosa, and tongue normal  NECK: supple, thyroid normal size, non-tender, without  nodularity LYMPH:  no palpable lymphadenopathy in the cervical, axillary or inguinal LUNGS: clear to auscultation and percussion with normal breathing effort HEART: regular rate & rhythm and no murmurs and no lower extremity edema ABDOMEN:abdomen soft, non-tender and normal bowel sounds MUSCULOSKELETAL:no cyanosis of digits and no clubbing  NEURO: alert & oriented x 3 with fluent speech, no focal motor/sensory deficits EXTREMITIES: No lower extremity edema  LABORATORY DATA:  I have reviewed the data as listed   Chemistry      Component Value Date/Time   NA 135 04/15/2015 0525   K 4.7 04/15/2015 0525   CL 102 04/15/2015 0525   CO2 28 04/15/2015 0525   BUN 8 04/15/2015 0525   CREATININE 1.26* 04/15/2015 0525      Component Value Date/Time   CALCIUM 8.3* 04/15/2015 0525   ALKPHOS 73 09/21/2007 2155   AST 26 09/21/2007 2155   ALT 23 09/21/2007 2155   BILITOT 0.5 09/21/2007 2155       Lab Results  Component Value Date   WBC 9.6 04/17/2015   HGB 9.1* 04/17/2015   HCT 27.8* 04/17/2015   MCV 91.4 04/17/2015   PLT 159 04/17/2015   NEUTROABS 4.5 05/30/2008   ASSESSMENT & PLAN:  Breast cancer of upper-inner quadrant of right female breast ([place]) Right breast biopsy 2:00 on 07/28/2015: Invasive ductal carcinoma with DCIS, positive for perineural invasion, grade 1,  ER 100%, PR 90%, HER-2 negative ratio 1.29, Ki-67 10% Right breast posterior spiculated mass 2:00 position 07/14/2015: 1.7 cm, multiple normal-sized right axillary lymph nodes with borderline diffuse cortical thickening, probable pectoral muscle invasion T1 cN0 stage IA Breast MRI 08/24/2015: Right breast mass 2.4 x 1.8 x 1.6 cm, the enhancement does not appear to abut or invade the adjacent pectoralis muscle  Clinical stage: T2 N0 stage II a  Recommendation: 1. Based on the MRI breast results, there is no concern for pectoralis invasion. So the patient may undergo BCS upfront. 2. RTC after surgery to discuss the final  path report 3. Patient will need adjuvant XRT 4. Foll by Anti-estrogen therapy   Patient does not have any interest in adjuvant chemotherapy and hence I did not see any point in sending for Oncotype DX testing.  No orders of the defined types were placed in this encounter.   The patient has a good understanding of the overall plan. she agrees with it. she will call with any problems that may develop before the next visit here.   Rulon Eisenmenger, MD 08/25/2015

## 2015-08-28 DIAGNOSIS — F419 Anxiety disorder, unspecified: Secondary | ICD-10-CM | POA: Diagnosis not present

## 2015-08-28 DIAGNOSIS — M170 Bilateral primary osteoarthritis of knee: Secondary | ICD-10-CM | POA: Diagnosis not present

## 2015-09-01 ENCOUNTER — Other Ambulatory Visit: Payer: Self-pay | Admitting: General Surgery

## 2015-09-01 DIAGNOSIS — C50211 Malignant neoplasm of upper-inner quadrant of right female breast: Secondary | ICD-10-CM | POA: Diagnosis not present

## 2015-09-03 ENCOUNTER — Inpatient Hospital Stay: Admission: RE | Admit: 2015-09-03 | Payer: Medicare Other | Source: Ambulatory Visit

## 2015-09-10 ENCOUNTER — Other Ambulatory Visit: Payer: Self-pay | Admitting: *Deleted

## 2015-09-11 ENCOUNTER — Other Ambulatory Visit: Payer: Self-pay | Admitting: General Surgery

## 2015-09-11 ENCOUNTER — Telehealth: Payer: Self-pay | Admitting: Hematology and Oncology

## 2015-09-11 DIAGNOSIS — C50211 Malignant neoplasm of upper-inner quadrant of right female breast: Secondary | ICD-10-CM

## 2015-09-11 NOTE — Telephone Encounter (Signed)
Spoke with pt to confirm 3/13 appt per pof

## 2015-09-22 ENCOUNTER — Encounter (HOSPITAL_BASED_OUTPATIENT_CLINIC_OR_DEPARTMENT_OTHER): Payer: Self-pay | Admitting: *Deleted

## 2015-09-23 ENCOUNTER — Ambulatory Visit
Admission: RE | Admit: 2015-09-23 | Discharge: 2015-09-23 | Disposition: A | Discharge status: Home / Self Care | Payer: Medicare Other | Source: Ambulatory Visit | Attending: General Surgery | Admitting: General Surgery

## 2015-09-23 ENCOUNTER — Other Ambulatory Visit: Payer: Self-pay | Admitting: General Surgery

## 2015-09-23 DIAGNOSIS — C50211 Malignant neoplasm of upper-inner quadrant of right female breast: Secondary | ICD-10-CM

## 2015-09-23 DIAGNOSIS — N63 Unspecified lump in breast: Secondary | ICD-10-CM | POA: Diagnosis not present

## 2015-09-28 ENCOUNTER — Ambulatory Visit (HOSPITAL_BASED_OUTPATIENT_CLINIC_OR_DEPARTMENT_OTHER): Payer: Medicare Other | Admitting: Certified Registered"

## 2015-09-28 ENCOUNTER — Ambulatory Visit
Admission: RE | Admit: 2015-09-28 | Discharge: 2015-09-28 | Disposition: A | Discharge status: Home / Self Care | Payer: Medicare Other | Source: Ambulatory Visit | Attending: General Surgery | Admitting: General Surgery

## 2015-09-28 ENCOUNTER — Ambulatory Visit (HOSPITAL_BASED_OUTPATIENT_CLINIC_OR_DEPARTMENT_OTHER)
Admission: RE | Admit: 2015-09-28 | Discharge: 2015-09-28 | Disposition: A | Discharge status: Home / Self Care | Payer: Medicare Other | Source: Ambulatory Visit | Attending: General Surgery | Admitting: General Surgery

## 2015-09-28 ENCOUNTER — Encounter (HOSPITAL_BASED_OUTPATIENT_CLINIC_OR_DEPARTMENT_OTHER): Payer: Self-pay | Admitting: Certified Registered"

## 2015-09-28 ENCOUNTER — Encounter (HOSPITAL_BASED_OUTPATIENT_CLINIC_OR_DEPARTMENT_OTHER)
Admission: RE | Disposition: A | Discharge status: Home / Self Care | Payer: Self-pay | Source: Ambulatory Visit | Attending: General Surgery

## 2015-09-28 ENCOUNTER — Encounter (HOSPITAL_COMMUNITY)
Admission: RE | Admit: 2015-09-28 | Discharge: 2015-09-28 | Disposition: A | Discharge status: Home / Self Care | Payer: Medicare Other | Source: Ambulatory Visit | Attending: General Surgery | Admitting: General Surgery

## 2015-09-28 DIAGNOSIS — Z170 Estrogen receptor positive status [ER+]: Secondary | ICD-10-CM | POA: Diagnosis not present

## 2015-09-28 DIAGNOSIS — G8918 Other acute postprocedural pain: Secondary | ICD-10-CM | POA: Diagnosis not present

## 2015-09-28 DIAGNOSIS — C50911 Malignant neoplasm of unspecified site of right female breast: Secondary | ICD-10-CM | POA: Diagnosis not present

## 2015-09-28 DIAGNOSIS — I10 Essential (primary) hypertension: Secondary | ICD-10-CM | POA: Insufficient documentation

## 2015-09-28 DIAGNOSIS — R079 Chest pain, unspecified: Secondary | ICD-10-CM | POA: Diagnosis not present

## 2015-09-28 DIAGNOSIS — K219 Gastro-esophageal reflux disease without esophagitis: Secondary | ICD-10-CM | POA: Diagnosis not present

## 2015-09-28 DIAGNOSIS — C50219 Malignant neoplasm of upper-inner quadrant of unspecified female breast: Secondary | ICD-10-CM | POA: Diagnosis not present

## 2015-09-28 DIAGNOSIS — Z79899 Other long term (current) drug therapy: Secondary | ICD-10-CM | POA: Diagnosis not present

## 2015-09-28 DIAGNOSIS — C50211 Malignant neoplasm of upper-inner quadrant of right female breast: Secondary | ICD-10-CM

## 2015-09-28 DIAGNOSIS — R928 Other abnormal and inconclusive findings on diagnostic imaging of breast: Secondary | ICD-10-CM | POA: Diagnosis not present

## 2015-09-28 HISTORY — PX: BREAST LUMPECTOMY WITH RADIOACTIVE SEED AND SENTINEL LYMPH NODE BIOPSY: SHX6550

## 2015-09-28 SURGERY — BREAST LUMPECTOMY WITH RADIOACTIVE SEED AND SENTINEL LYMPH NODE BIOPSY
Anesthesia: General | Site: Breast | Laterality: Right

## 2015-09-28 MED ORDER — CHLORHEXIDINE GLUCONATE 4 % EX LIQD: 1.0000 "application " | Freq: Once | CUTANEOUS | Status: DC

## 2015-09-28 MED ORDER — TECHNETIUM TC 99M SULFUR COLLOID FILTERED
1.0000 | Freq: Once | INTRAVENOUS | Status: AC | PRN
  Administered 2015-09-28: 1 via INTRADERMAL

## 2015-09-28 MED ORDER — ONDANSETRON HCL 4 MG/2ML IJ SOLN
INTRAMUSCULAR | Status: DC | PRN
  Administered 2015-09-28: 4 mg via INTRAVENOUS

## 2015-09-28 MED ORDER — HYDROMORPHONE HCL 1 MG/ML IJ SOLN
0.2500 mg | INTRAMUSCULAR | Status: DC | PRN
  Administered 2015-09-28 (×2): 0.5 mg via INTRAVENOUS

## 2015-09-28 MED ORDER — SODIUM CHLORIDE 0.9 % IJ SOLN
INTRAVENOUS | Status: DC | PRN
  Administered 2015-09-28: 5 mL

## 2015-09-28 MED ORDER — DEXAMETHASONE SODIUM PHOSPHATE 10 MG/ML IJ SOLN
INTRAMUSCULAR | Status: AC
  Filled 2015-09-28: qty 1

## 2015-09-28 MED ORDER — LIDOCAINE HCL (CARDIAC) 20 MG/ML IV SOLN
INTRAVENOUS | Status: DC | PRN
  Administered 2015-09-28: 30 mg via INTRAVENOUS

## 2015-09-28 MED ORDER — DEXAMETHASONE SODIUM PHOSPHATE 4 MG/ML IJ SOLN
INTRAMUSCULAR | Status: DC | PRN
  Administered 2015-09-28: 10 mg via INTRAVENOUS

## 2015-09-28 MED ORDER — BUPIVACAINE-EPINEPHRINE (PF) 0.5% -1:200000 IJ SOLN
INTRAMUSCULAR | Status: DC | PRN
  Administered 2015-09-28: 25 mL via PERINEURAL

## 2015-09-28 MED ORDER — FENTANYL CITRATE (PF) 100 MCG/2ML IJ SOLN
INTRAMUSCULAR | Status: AC
  Filled 2015-09-28: qty 2

## 2015-09-28 MED ORDER — PROPOFOL 10 MG/ML IV BOLUS
INTRAVENOUS | Status: DC | PRN
  Administered 2015-09-28: 150 mg via INTRAVENOUS

## 2015-09-28 MED ORDER — ONDANSETRON HCL 4 MG/2ML IJ SOLN
INTRAMUSCULAR | Status: AC
  Filled 2015-09-28: qty 2

## 2015-09-28 MED ORDER — FENTANYL CITRATE (PF) 100 MCG/2ML IJ SOLN
50.0000 ug | INTRAMUSCULAR | Status: AC | PRN
  Administered 2015-09-28: 100 ug via INTRAVENOUS
  Administered 2015-09-28 (×2): 25 ug via INTRAVENOUS
  Administered 2015-09-28: 50 ug via INTRAVENOUS

## 2015-09-28 MED ORDER — EPHEDRINE SULFATE 50 MG/ML IJ SOLN
INTRAMUSCULAR | Status: AC
  Filled 2015-09-28: qty 1

## 2015-09-28 MED ORDER — EPHEDRINE SULFATE 50 MG/ML IJ SOLN
INTRAMUSCULAR | Status: DC | PRN
  Administered 2015-09-28 (×3): 10 mg via INTRAVENOUS

## 2015-09-28 MED ORDER — GLYCOPYRROLATE 0.2 MG/ML IJ SOLN: 0.2000 mg | Freq: Once | INTRAMUSCULAR | Status: DC | PRN

## 2015-09-28 MED ORDER — SCOPOLAMINE 1 MG/3DAYS TD PT72: 1.0000 | MEDICATED_PATCH | Freq: Once | TRANSDERMAL | Status: DC | PRN

## 2015-09-28 MED ORDER — VANCOMYCIN HCL IN DEXTROSE 1-5 GM/200ML-% IV SOLN
1000.0000 mg | INTRAVENOUS | Status: AC
  Administered 2015-09-28: 1000 mg via INTRAVENOUS

## 2015-09-28 MED ORDER — LIDOCAINE HCL (CARDIAC) 20 MG/ML IV SOLN
INTRAVENOUS | Status: AC
  Filled 2015-09-28: qty 5

## 2015-09-28 MED ORDER — BUPIVACAINE HCL (PF) 0.25 % IJ SOLN
INTRAMUSCULAR | Status: DC | PRN
  Administered 2015-09-28: 20 mL

## 2015-09-28 MED ORDER — OXYCODONE HCL 5 MG PO TABS
ORAL_TABLET | ORAL | Status: AC
  Filled 2015-09-28: qty 1

## 2015-09-28 MED ORDER — MIDAZOLAM HCL 2 MG/2ML IJ SOLN
1.0000 mg | INTRAMUSCULAR | Status: DC | PRN
  Administered 2015-09-28: 1 mg via INTRAVENOUS

## 2015-09-28 MED ORDER — SUCCINYLCHOLINE CHLORIDE 20 MG/ML IJ SOLN
INTRAMUSCULAR | Status: AC
  Filled 2015-09-28: qty 1

## 2015-09-28 MED ORDER — VANCOMYCIN HCL IN DEXTROSE 1-5 GM/200ML-% IV SOLN
INTRAVENOUS | Status: AC
  Filled 2015-09-28: qty 200

## 2015-09-28 MED ORDER — OXYCODONE HCL 5 MG/5ML PO SOLN: 5.0000 mg | Freq: Once | ORAL | Status: AC | PRN

## 2015-09-28 MED ORDER — HYDROMORPHONE HCL 1 MG/ML IJ SOLN
INTRAMUSCULAR | Status: AC
  Filled 2015-09-28: qty 1

## 2015-09-28 MED ORDER — OXYCODONE-ACETAMINOPHEN 5-325 MG PO TABS: 1.0000 | ORAL_TABLET | ORAL | Status: DC | PRN

## 2015-09-28 MED ORDER — OXYCODONE HCL 5 MG PO TABS
5.0000 mg | ORAL_TABLET | Freq: Once | ORAL | Status: AC | PRN
  Administered 2015-09-28: 5 mg via ORAL

## 2015-09-28 MED ORDER — PROPOFOL 10 MG/ML IV BOLUS
INTRAVENOUS | Status: AC
  Filled 2015-09-28: qty 20

## 2015-09-28 MED ORDER — LACTATED RINGERS IV SOLN
INTRAVENOUS | Status: DC
  Administered 2015-09-28: 10:00:00 via INTRAVENOUS

## 2015-09-28 MED ORDER — MEPERIDINE HCL 25 MG/ML IJ SOLN: 6.2500 mg | INTRAMUSCULAR | Status: DC | PRN

## 2015-09-28 MED ORDER — MIDAZOLAM HCL 2 MG/2ML IJ SOLN
INTRAMUSCULAR | Status: AC
Start: 2015-09-28 — End: 2015-09-28
  Filled 2015-09-28: qty 2

## 2015-09-28 SURGICAL SUPPLY — 43 items
APPLIER CLIP 9.375 MED OPEN (MISCELLANEOUS) ×2
APR CLP MED 9.3 20 MLT OPN (MISCELLANEOUS) ×1
BLADE SURG 15 STRL LF DISP TIS (BLADE) ×1 IMPLANT
BLADE SURG 15 STRL SS (BLADE) ×2
CANISTER SUC SOCK COL 7IN (MISCELLANEOUS) IMPLANT
CANISTER SUCT 1200ML W/VALVE (MISCELLANEOUS) IMPLANT
CHLORAPREP W/TINT 26ML (MISCELLANEOUS) ×2 IMPLANT
CLIP APPLIE 9.375 MED OPEN (MISCELLANEOUS) ×1 IMPLANT
COVER BACK TABLE 60X90IN (DRAPES) ×2 IMPLANT
COVER MAYO STAND STRL (DRAPES) ×2 IMPLANT
COVER PROBE W GEL 5X96 (DRAPES) ×2 IMPLANT
DECANTER SPIKE VIAL GLASS SM (MISCELLANEOUS) IMPLANT
DEVICE DUBIN W/COMP PLATE 8390 (MISCELLANEOUS) ×2 IMPLANT
DRAPE LAPAROSCOPIC ABDOMINAL (DRAPES) ×2 IMPLANT
DRAPE UTILITY XL STRL (DRAPES) ×2 IMPLANT
ELECT COATED BLADE 2.86 ST (ELECTRODE) ×2 IMPLANT
ELECT REM PT RETURN 9FT ADLT (ELECTROSURGICAL) ×2
ELECTRODE REM PT RTRN 9FT ADLT (ELECTROSURGICAL) ×1 IMPLANT
GLOVE BIO SURGEON STRL SZ7.5 (GLOVE) ×2 IMPLANT
GLOVE BIOGEL PI IND STRL 8 (GLOVE) IMPLANT
GLOVE BIOGEL PI INDICATOR 8 (GLOVE) ×1
GLOVE SURG SS PI 7.5 STRL IVOR (GLOVE) ×1 IMPLANT
GOWN STRL REUS W/ TWL LRG LVL3 (GOWN DISPOSABLE) ×2 IMPLANT
GOWN STRL REUS W/TWL LRG LVL3 (GOWN DISPOSABLE) ×4
KIT MARKER MARGIN INK (KITS) ×2 IMPLANT
LIQUID BAND (GAUZE/BANDAGES/DRESSINGS) ×3 IMPLANT
NDL HYPO 25X1 1.5 SAFETY (NEEDLE) ×1 IMPLANT
NDL SAFETY ECLIPSE 18X1.5 (NEEDLE) IMPLANT
NEEDLE HYPO 18GX1.5 SHARP (NEEDLE) ×2
NEEDLE HYPO 25X1 1.5 SAFETY (NEEDLE) ×2 IMPLANT
NS IRRIG 1000ML POUR BTL (IV SOLUTION) ×1 IMPLANT
PACK BASIN DAY SURGERY FS (CUSTOM PROCEDURE TRAY) ×2 IMPLANT
PENCIL BUTTON HOLSTER BLD 10FT (ELECTRODE) ×2 IMPLANT
SLEEVE SCD COMPRESS KNEE MED (MISCELLANEOUS) ×2 IMPLANT
SPONGE LAP 18X18 X RAY DECT (DISPOSABLE) ×2 IMPLANT
SUT MON AB 4-0 PC3 18 (SUTURE) ×4 IMPLANT
SUT SILK 2 0 SH (SUTURE) IMPLANT
SUT VICRYL 3-0 CR8 SH (SUTURE) ×2 IMPLANT
SYR CONTROL 10ML LL (SYRINGE) ×2 IMPLANT
TOWEL OR 17X24 6PK STRL BLUE (TOWEL DISPOSABLE) ×2 IMPLANT
TOWEL OR NON WOVEN STRL DISP B (DISPOSABLE) ×2 IMPLANT
TUBE CONNECTING 20X1/4 (TUBING) ×1 IMPLANT
YANKAUER SUCT BULB TIP NO VENT (SUCTIONS) ×1 IMPLANT

## 2015-09-28 NOTE — Discharge Instructions (Signed)

## 2015-09-28 NOTE — Transfer of Care (Signed)
Immediate Anesthesia Transfer of Care Note  Patient: Kathryn Ortiz  Procedure(s) Performed: Procedure(s): BREAST LUMPECTOMY WITH RADIOACTIVE SEED AND SENTINEL LYMPH NODE BIOPSY (Right)  Patient Location: PACU  Anesthesia Type:GA combined with regional for post-op pain  Level of Consciousness: awake, alert , oriented and patient cooperative  Airway & Oxygen Therapy: Patient Spontanous Breathing and Patient connected to face mask oxygen  Post-op Assessment: Report given to RN and Post -op Vital signs reviewed and stable  Post vital signs: Reviewed and stable  Last Vitals:  Filed Vitals:   09/28/15 1010 09/28/15 1218  BP:    Pulse: 87 111  Temp:    Resp: 13 16    Complications: No apparent anesthesia complications

## 2015-09-28 NOTE — H&P (Signed)
Kathryn Ortiz. Kathryn Ortiz  Location: [facility] Patient #: [id number] DOB: [birth year] Married / Language: English / Race: White Female   History of Present Illness  Patient words: reck.  The patient is a 75 year old female who presents for a follow-up for Breast cancer. The patient is a 75 year old white female who was initially found to have a 1.7 cm cancer in the upper inner right breast. It was thought to be invading the pectoralis muscle but her recent MRI did not show this invasion. It measured 2.4 cm by MRI. It was ER and PR positive and HER-2 negative with a Ki-67 of 10%.   Allergies PenicillAMINE *ASSORTED CLASSES*  Medication History  Citalopram Hydrobromide ('10MG'$  Tablet, Oral) Active. Omeprazole ('20MG'$  Tablet DR, Oral) Active. Gabapentin ('300MG'$  Capsule, Oral) Active. TraZODone HCl ('100MG'$  Tablet, Oral) Active. Hydrocodone-Acetaminophen (7.5-'325MG'$  Tablet, Oral) Active. KlonoPIN (0.'5MG'$  Tablet, Oral) Active. Medications Reconciled    Review of Systems  General Present- Appetite Loss, Fatigue and Weight Loss. Not Present- Chills, Fever, Night Sweats and Weight Gain. Skin Not Present- Change in Wart/Mole, Dryness, Hives, Jaundice, New Lesions, Non-Healing Wounds, Rash and Ulcer. HEENT Present- Hoarseness, Ringing in the Ears, Visual Disturbances and Wears glasses/contact lenses. Not Present- Earache, Hearing Loss, Nose Bleed, Oral Ulcers, Seasonal Allergies, Sinus Pain, Sore Throat and Yellow Eyes. Respiratory Present- Chronic Cough and Snoring. Not Present- Bloody sputum, Difficulty Breathing and Wheezing. Breast Present- Breast Mass. Not Present- Breast Pain, Nipple Discharge and Skin Changes. Cardiovascular Present- Palpitations and Swelling of Extremities. Not Present- Chest Pain, Difficulty Breathing Lying Down, Leg Cramps, Rapid Heart Rate and Shortness of Breath. Gastrointestinal Present- Constipation, Excessive gas and Gets full quickly at meals. Not Present-  Abdominal Pain, Bloating, Bloody Stool, Change in Bowel Habits, Chronic diarrhea, Difficulty Swallowing, Hemorrhoids, Indigestion, Nausea, Rectal Pain and Vomiting. Female Genitourinary Present- Frequency and Nocturia. Not Present- Painful Urination, Pelvic Pain and Urgency. Musculoskeletal Present- Joint Stiffness and Muscle Pain. Not Present- Back Pain, Joint Pain, Muscle Weakness and Swelling of Extremities. Neurological Present- Decreased Memory. Not Present- Fainting, Headaches, Numbness, Seizures, Tingling, Tremor, Trouble walking and Weakness. Psychiatric Present- Anxiety and Depression. Not Present- Bipolar, Change in Sleep Pattern, Fearful and Frequent crying. Endocrine Present- Cold Intolerance. Not Present- Excessive Hunger, Hair Changes, Heat Intolerance, Hot flashes and New Diabetes. Hematology Present- Easy Bruising. Not Present- Excessive bleeding, Gland problems, HIV and Persistent Infections.  Vitals  Weight: 146 lb Height: 60in Body Surface Area: 1.63 m Body Mass Index: 28.51 kg/m  Temp.: 75F(Temporal)  Pulse: 76 (Regular)  BP: 122/80 (Sitting, Left Arm, Standard)       Physical Exam General Mental Status-Alert. General Appearance-Consistent with stated age. Hydration-Well hydrated. Voice-Normal.  Head and Neck Head-normocephalic, atraumatic with no lesions or palpable masses. Trachea-midline. Thyroid Gland Characteristics - normal size and consistency.  Eye Eyeball - Bilateral-Extraocular movements intact. Sclera/Conjunctiva - Bilateral-No scleral icterus.  Chest and Lung Exam Chest and lung exam reveals -quiet, even and easy respiratory effort with no use of accessory muscles and on auscultation, normal breath sounds, no adventitious sounds and normal vocal resonance. Inspection Chest Wall - Normal. Back - normal.  Breast Note: There is a roughly 2 cm palpable mass in the upper inner quadrant of the right breast. There  is no other palpable mass in either breast. There is a small mobile palpable lymph node in the right axilla. There is no supraclavicular or cervical palpable lymphadenopathy. It feels less discrete than it did the first time we saw her.   Cardiovascular Cardiovascular  examination reveals -normal heart sounds, regular rate and rhythm with no murmurs and normal pedal pulses bilaterally.  Abdomen Inspection Inspection of the abdomen reveals - No Hernias. Skin - Scar - no surgical scars. Palpation/Percussion Palpation and Percussion of the abdomen reveal - Soft, Non Tender, No Rebound tenderness, No Rigidity (guarding) and No hepatosplenomegaly. Auscultation Auscultation of the abdomen reveals - Bowel sounds normal.  Neurologic Neurologic evaluation reveals -alert and oriented x 3 with no impairment of recent or remote memory. Mental Status-Normal.  Musculoskeletal Normal Exam - Left-Upper Extremity Strength Normal and Lower Extremity Strength Normal. Normal Exam - Right-Upper Extremity Strength Normal and Lower Extremity Strength Normal.  Lymphatic Head & Neck  General Head & Neck Lymphatics: Bilateral - Description - Normal. Axillary  General Axillary Region: Bilateral - Description - Normal. Tenderness - Non Tender. Femoral & Inguinal  Generalized Femoral & Inguinal Lymphatics: Bilateral - Description - Normal. Tenderness - Non Tender.    Assessment & Plan  BREAST CANCER OF UPPER-INNER QUADRANT OF RIGHT FEMALE BREAST (C50.211) Impression: The patient has a 2.4 cm cancer in the upper inner right breast. It does not appear to invade the chest wall muscle on recent MRI. I have talked her in detail about the options for treatment and at this point she favors breast conservation. I will plan for a right breast radioactive seed localized lumpectomy and sentinel node mapping. I have discussed with her in detail the risks and benefits of the operation as well as some of the  technical aspects and she understands and wishes to proceed    Signed by Luella Cook, MD

## 2015-09-28 NOTE — Anesthesia Procedure Notes (Addendum)
Anesthesia Regional Block:  Pectoralis block  Pre-Anesthetic Checklist: ,, timeout performed, Correct Patient, Correct Site, Correct Laterality, Correct Procedure, Correct Position, site marked, Risks and benefits discussed,  Surgical consent,  Pre-op evaluation,  At surgeon's request and post-op pain management  Laterality: Right and Upper  Prep: chloraprep       Needles:  Injection technique: Single-shot  Needle Type: Echogenic Needle     Needle Length: 9cm 9 cm Needle Gauge: 21 and 21 G    Additional Needles:  Procedures: ultrasound guided (picture in chart) Pectoralis block Narrative:  Start time: 09/28/2015 9:36 AM End time: 09/28/2015 9:41 AM Injection made incrementally with aspirations every 5 mL.  Performed by: Personally  Anesthesiologist: CREWS, DAVID   Procedure Name: LMA Insertion Date/Time: 09/28/2015 10:43 AM Performed by: Harl Wiechmann D Pre-anesthesia Checklist: Patient identified, Emergency Drugs available, Suction available and Patient being monitored Patient Re-evaluated:Patient Re-evaluated prior to inductionOxygen Delivery Method: Circle System Utilized Preoxygenation: Pre-oxygenation with 100% oxygen Intubation Type: IV induction Ventilation: Mask ventilation without difficulty LMA: LMA inserted LMA Size: 4.0 Number of attempts: 1 Airway Equipment and Method: Bite block Placement Confirmation: positive ETCO2 Tube secured with: Tape Dental Injury: Teeth and Oropharynx as per pre-operative assessment       Right PEC block image

## 2015-09-28 NOTE — Progress Notes (Signed)
Assisted Dr. Al Corpus with right, ultrasound guided, pectoralis block and nuc med inj with med tech [facility]. Side rails up, monitors on throughout procedure. See vital signs in flow sheet. Tolerated Procedure well.

## 2015-09-28 NOTE — Anesthesia Postprocedure Evaluation (Signed)
Anesthesia Post Note  Patient: Kathryn Ortiz  Procedure(s) Performed: Procedure(s) (LRB): BREAST LUMPECTOMY WITH RADIOACTIVE SEED AND SENTINEL LYMPH NODE BIOPSY (Right)  Patient location during evaluation: PACU Anesthesia Type: General Level of consciousness: awake and alert Pain management: pain level controlled Vital Signs Assessment: post-procedure vital signs reviewed and stable Respiratory status: spontaneous breathing, nonlabored ventilation and respiratory function stable Cardiovascular status: blood pressure returned to baseline and stable Postop Assessment: no signs of nausea or vomiting Anesthetic complications: no    Last Vitals:  Filed Vitals:   09/28/15 1315 09/28/15 1330  BP: 136/58 130/55  Pulse: 78 79  Temp:    Resp: 9 9    Last Pain:  Filed Vitals:   09/28/15 1343  PainSc: 5                  Alvey Brockel A

## 2015-09-28 NOTE — Anesthesia Preprocedure Evaluation (Signed)
Anesthesia Evaluation  Patient identified by MRN, date of birth, ID band Patient awake    Reviewed: Allergy & Precautions, NPO status , Patient's Chart, lab work & pertinent test results  Airway Mallampati: II  TM Distance: >3 FB Neck ROM: Full    Dental  (+) Upper Dentures, Lower Dentures   Pulmonary    breath sounds clear to auscultation       Cardiovascular hypertension,  Rhythm:Regular Rate:Normal     Neuro/Psych    GI/Hepatic GERD  Medicated,  Endo/Other    Renal/GU      Musculoskeletal   Abdominal   Peds  Hematology   Anesthesia Other Findings   Reproductive/Obstetrics                             Anesthesia Physical Anesthesia Plan  ASA: II  Anesthesia Plan: General   Post-op Pain Management: MAC Combined w/ Regional for Post-op pain   Induction: Intravenous  Airway Management Planned: LMA  Additional Equipment:   Intra-op Plan:   Post-operative Plan: Extubation in OR  Informed Consent: I have reviewed the patients History and Physical, chart, labs and discussed the procedure including the risks, benefits and alternatives for the proposed anesthesia with the patient or authorized representative who has indicated his/her understanding and acceptance.   Dental advisory given  Plan Discussed with: CRNA, Anesthesiologist and Surgeon  Anesthesia Plan Comments:         Anesthesia Quick Evaluation

## 2015-09-28 NOTE — Op Note (Signed)
09/28/2015  12:06 PM  PATIENT:  Kathryn Ortiz  75 y.o. female  PRE-OPERATIVE DIAGNOSIS:   RIGHT BREAST CANCER  POST-OPERATIVE DIAGNOSIS:  RIGHT BREAST CANCER  PROCEDURE:  Procedure(s): BREAST LUMPECTOMY WITH RADIOACTIVE SEED AND SENTINEL LYMPH NODE BIOPSY (Right) with injection blue dye  SURGEON:  Surgeon(s) and Role:    * Jovita Kussmaul, MD - Primary  PHYSICIAN ASSISTANT:   ASSISTANTS: none   ANESTHESIA:   general  EBL:  Total I/O In: 1500 [I.V.:1500] Out: -   BLOOD ADMINISTERED:none  DRAINS: none   LOCAL MEDICATIONS USED:  MARCAINE     SPECIMEN:  Source of Specimen:  right breast tissue and sentinel nodes  DISPOSITION OF SPECIMEN:  PATHOLOGY  COUNTS:  YES  TOURNIQUET:  * No tourniquets in log *  DICTATION: .Dragon Dictation   After informed consent was obtained the patient was brought to the operating room and placed in the supine position on the operating room table. After adequate induction of general anesthesia the patient's right chest, breast, and axillary area were prepped with ChloraPrep, allowed to dry, and draped in usual sterile manner. An appropriate timeout was performed. Earlier in the day the patient underwent injection of 1 mCi of technetium sulfur colloid in the subareolar position on the right. Previously an I-125 seed was placed in the upper inner quadrant of the right breast to mark an area of invasive breast cancer. The neoprobe was set to technetium in the right axilla was examined. There was no evidence of radioactive uptake in the axilla. At this point, 2 mL and left lingula and 3 mL of injectable saline were also injected in the subareolar position. Attention was first turned to the breast. The neoprobe was set to I-125. The area of radioactivity was readily identified in the upper inner quadrant of the right breast. An elliptical incision was made in the skin overlying the area of radioactivity with a 15 blade knife. The incision was carried through  the skin and subcutaneous tissue sharply with the electrocautery. While checking the area of radioactivity frequently a circular portion of breast tissue was excised sharply around the radioactive seed. This dissection was carried all the way to the chest wall. Once the specimen was removed it was oriented with the appropriate paint colors. A specimen radiograph was obtained that showed the clip in seed being in the center of the specimen. The specimen was then sent to pathology for further evaluation. The wound was irrigated with saline and infiltrated with quarter percent Marcaine. The edges of the cavity were marked with clips. The deep layer of the wound was then closed in layers of interrupted 3-0 Vicryl stitches. The skin was then closed with interrupted 4-0 Monocryl subcuticular stitches. Attention was then turned to the right axilla. Still there was no radioactive uptake in the axilla. A small transversely oriented incision was then made along the lower portion of the axilla with a 15 blade knife. The incision was carried through the skin and subcutaneous tissue sharply with the electrocautery until the axilla was entered. Blunt hemostat dissection was carried out and in doing so I was able to identify 2 lymph nodes with blue dye. These were excised sharply with the electrocautery and the lymphatics were controlled with clips. These were sent as sentinel nodes #1 and 2. There was no radioactive uptake in either of these nodes. There were no other hot, blue, or palpable lymph nodes in the right axilla. The wound was irrigated with saline and infiltrated  with quarter percent Marcaine. The deep layer of the wound was closed with interrupted 3-0 Vicryl stitches. The skin was then closed with a running 4-0 Monocryl subcuticular stitch. Dermabond dressings were applied. The patient tolerated the procedure well. At the end of the case all needle sponge counts were correct. The patient was then awakened and taken to  recovery in stable condition.  PLAN OF CARE: Discharge to home after PACU  PATIENT DISPOSITION:  PACU - hemodynamically stable.   Delay start of Pharmacological VTE agent (>24hrs) due to surgical blood loss or risk of bleeding: not applicable

## 2015-09-28 NOTE — Interval H&P Note (Signed)
History and Physical Interval Note:  09/28/2015 10:23 AM  Kathryn Ortiz  has presented today for surgery, with the diagnosis of  RIGHT BREAST CANCER  The various methods of treatment have been discussed with the patient and family. After consideration of risks, benefits and other options for treatment, the patient has consented to  Procedure(s): BREAST LUMPECTOMY WITH RADIOACTIVE SEED AND SENTINEL LYMPH NODE BIOPSY (Right) as a surgical intervention .  The patient's history has been reviewed, patient examined, no change in status, stable for surgery.  I have reviewed the patient's chart and labs.  Questions were answered to the patient's satisfaction.     TOTH III,Chisum Habenicht S

## 2015-09-29 ENCOUNTER — Encounter (HOSPITAL_BASED_OUTPATIENT_CLINIC_OR_DEPARTMENT_OTHER): Payer: Self-pay | Admitting: General Surgery

## 2015-09-29 NOTE — Anesthesia Postprocedure Evaluation (Signed)
Anesthesia Post Note  Patient: Kathryn Ortiz  Procedure(s) Performed: Procedure(s) (LRB): BREAST LUMPECTOMY WITH RADIOACTIVE SEED AND SENTINEL LYMPH NODE BIOPSY (Right)  Patient location during evaluation: PACU Anesthesia Type: General Level of consciousness: awake and alert Pain management: pain level controlled Vital Signs Assessment: post-procedure vital signs reviewed and stable Respiratory status: spontaneous breathing, nonlabored ventilation and respiratory function stable Cardiovascular status: blood pressure returned to baseline and stable Postop Assessment: no signs of nausea or vomiting Anesthetic complications: no    Last Vitals:  Filed Vitals:   09/28/15 1330 09/28/15 1428  BP: 130/55 156/67  Pulse: 79 96  Temp:  36.4 C  Resp: 9 18    Last Pain:  Filed Vitals:   09/28/15 1429  PainSc: 4                  Micco Bourbeau A

## 2015-10-03 NOTE — Assessment & Plan Note (Signed)
Rt Lumpectomy 09/28/15: IDC grade 1, 2.1 cm, LG DCIS, LVI and PNI, DCIS focally less than 1 mm, 0/3 LN, T2N0 (Stage 2A),  Pathology counseling: I discussed the final pathology report of the patient provided  a copy of this report. I discussed the margins as well as lymph node surgeries. We also discussed the final staging along with previously performed ER/PR and HER-2/neu testing.  Patient does not have any interest in adjuvant chemotherapy and hence I did not see any point in sending for Oncotype DX testing.  Recommendation: 1. adjuvant XRT 2. Foll by Anti-estrogen therapy  She may be considered for PALLAS clinical trial

## 2015-10-05 ENCOUNTER — Ambulatory Visit (HOSPITAL_BASED_OUTPATIENT_CLINIC_OR_DEPARTMENT_OTHER): Payer: Medicare Other | Admitting: Hematology and Oncology

## 2015-10-05 ENCOUNTER — Encounter: Payer: Self-pay | Admitting: Hematology and Oncology

## 2015-10-05 ENCOUNTER — Telehealth: Payer: Self-pay | Admitting: Hematology and Oncology

## 2015-10-05 VITALS — BP 141/60 | HR 89 | Temp 97.9°F | Resp 18 | Ht 59.0 in | Wt 143.5 lb

## 2015-10-05 DIAGNOSIS — C50211 Malignant neoplasm of upper-inner quadrant of right female breast: Secondary | ICD-10-CM | POA: Diagnosis not present

## 2015-10-05 NOTE — Telephone Encounter (Signed)
appt made and avs printed °

## 2015-10-05 NOTE — Progress Notes (Signed)
Patient Care Team: Jani Gravel, MD as PCP - General (Internal Medicine)  SUMMARY OF ONCOLOGIC HISTORY:   Breast cancer of upper-inner quadrant of right female breast ([place])   07/14/2015 Mammogram Right breast posterior spiculated mass 2:00 position: 1.7 cm, multiple normal-sized right axillary lymph nodes with borderline diffuse cortical thickening, T1 cN0 stage IA   07/28/2015 Initial Diagnosis Right breast biopsy 2:00: Invasive ductal carcinoma with DCIS, positive for perineural invasion, grade 1, ER 100%, PR 90%, HER-2 negative ratio 1.29, Ki-67 10%   08/24/2015 Breast MRI Right breast mass 2.4 x 1.8 x 1.6 cm, the enhancement does not appear to abut or invade the adjacent pectoralis muscle   09/25/2015 Surgery Rt Lumpectomy: IDC grade 1, 2.1 cm, LG DCIS, LVI and PNI, DCIS focally less than 1 mm, 0/3 LN, T2N0 (Stage 2A)    CHIEF COMPLIANT: Status post right lumpectomy to discuss pathology report  INTERVAL HISTORY: Kathryn Ortiz is a 75 year old with above-mentioned is a right breast cancer underwent lumpectomy and is here to discuss pathology report. She is healing very well from the surgery with some mild discomfort under the arm. She denies any other complaints or concerns. Her husband has not been feeling well either. He has had abdominal discomfort and has not been eating well. He does not have much energy either.  REVIEW OF SYSTEMS:   Constitutional: Denies fevers, chills or abnormal weight loss Eyes: Denies blurriness of vision Ears, nose, mouth, throat, and face: Denies mucositis or sore throat Respiratory: Denies cough, dyspnea or wheezes Cardiovascular: Denies palpitation, chest discomfort Gastrointestinal:  Denies nausea, heartburn or change in bowel habits Skin: Denies abnormal skin rashes Lymphatics: Denies new lymphadenopathy or easy bruising Neurological:Denies numbness, tingling or new weaknesses Behavioral/Psych: Mood is stable, no new changes  Extremities: No lower extremity  edema Breast: Recent right lumpectomy and sentinel lymph node biopsy All other systems were reviewed with the patient and are negative.  I have reviewed the past medical history, past surgical history, social history and family history with the patient and they are unchanged from previous note.  ALLERGIES:  is allergic to penicillins.  MEDICATIONS:  Current Outpatient Prescriptions  Medication Sig Dispense Refill  . anastrozole (ARIMIDEX) 1 MG tablet Take 1 tablet (1 mg total) by mouth daily. 90 tablet 3  . Cholecalciferol (VITAMIN D PO) Take 1 tablet by mouth daily.    . clonazePAM (KLONOPIN) 1 MG tablet Take 0.5 mg by mouth daily as needed for anxiety.    . Cyanocobalamin (VITAMIN B12 PO) Take 1 tablet by mouth daily.    Marland Kitchen gabapentin (NEURONTIN) 300 MG capsule Take 300 mg by mouth at bedtime.    . lovastatin (MEVACOR) 10 MG tablet Take 10 mg by mouth 2 (two) times a week.    Marland Kitchen omeprazole (PRILOSEC) 20 MG capsule Take 20 mg by mouth daily.    Marland Kitchen oxycodone (OXY-IR) 5 MG capsule Take 5 mg by mouth every 4 (four) hours as needed. #20, 0RF    . oxyCODONE-acetaminophen (ROXICET) 5-325 MG tablet Take 1-2 tablets by mouth every 4 (four) hours as needed. 50 tablet 0  . traZODone (DESYREL) 100 MG tablet Take 250 mg by mouth at bedtime.     No current facility-administered medications for this visit.    PHYSICAL EXAMINATION: ECOG PERFORMANCE STATUS: 1 - Symptomatic but completely ambulatory  Filed Vitals:   10/05/15 0945  BP: 141/60  Pulse: 89  Temp: 97.9 F (36.6 C)  Resp: 18   Filed Weights  10/05/15 0945  Weight: 143 lb 8 oz (65.091 kg)    GENERAL:alert, no distress and comfortable SKIN: skin color, texture, turgor are normal, no rashes or significant lesions EYES: normal, Conjunctiva are pink and non-injected, sclera clear OROPHARYNX:no exudate, no erythema and lips, buccal mucosa, and tongue normal  NECK: supple, thyroid normal size, non-tender, without nodularity LYMPH:  no  palpable lymphadenopathy in the cervical, axillary or inguinal LUNGS: clear to auscultation and percussion with normal breathing effort HEART: regular rate & rhythm and no murmurs and no lower extremity edema ABDOMEN:abdomen soft, non-tender and normal bowel sounds MUSCULOSKELETAL:no cyanosis of digits and no clubbing  NEURO: alert & oriented x 3 with fluent speech, no focal motor/sensory deficits EXTREMITIES: No lower extremity edema  LABORATORY DATA:  I have reviewed the data as listed   Chemistry      Component Value Date/Time   NA 135 04/15/2015 0525   K 4.7 04/15/2015 0525   CL 102 04/15/2015 0525   CO2 28 04/15/2015 0525   BUN 8 04/15/2015 0525   CREATININE 1.26* 04/15/2015 0525      Component Value Date/Time   CALCIUM 8.3* 04/15/2015 0525   ALKPHOS 73 09/21/2007 2155   AST 26 09/21/2007 2155   ALT 23 09/21/2007 2155   BILITOT 0.5 09/21/2007 2155       Lab Results  Component Value Date   WBC 9.6 04/17/2015   HGB 9.1* 04/17/2015   HCT 27.8* 04/17/2015   MCV 91.4 04/17/2015   PLT 159 04/17/2015   NEUTROABS 4.5 05/30/2008   ASSESSMENT & PLAN:  Breast cancer of upper-inner quadrant of right female breast ([place]) Rt Lumpectomy 09/28/15: IDC grade 1, 2.1 cm, LG DCIS, LVI and PNI, DCIS focally less than 1 mm, 0/3 LN, T2N0 (Stage 2A),  Pathology counseling: I discussed the final pathology report of the patient provided  a copy of this report. I discussed the margins as well as lymph node surgeries. We also discussed the final staging along with previously performed ER/PR and HER-2/neu testing.  Patient does not have any interest in adjuvant chemotherapy and hence I did not see any point in sending for Oncotype DX testing. Previous normocytic anemia related to anemia of chronic kidney disease Recommendation: 1. Presentation at tumor board regarding close margins for DCIS. I discussed with her options may be additional surgery. 2. adjuvant XRT 3. Foll by Anti-estrogen  therapy   Return to clinic in 3 months to begin antiestrogen therapy.  No orders of the defined types were placed in this encounter.   The patient has a good understanding of the overall plan. she agrees with it. she will call with any problems that may develop before the next visit here.   Rulon Eisenmenger, MD 10/05/2015

## 2015-10-19 NOTE — Progress Notes (Signed)
Location of Breast Cancer:Breast Cancer Right upper-inner quadrant female breast  Histology per Pathology Report: Diagnosis 09-28-15 1. Breast, lumpectomy, Right - INVASIVE DUCTAL CARCINOMA, GRADE I/III, SPANNING 2.1 CM. - DUCTAL CARCINOMA IN SITU, LOW GRADE. - LYMPHOVASCULAR INVASION IS IDENTIFIED. - PERINEURAL INVASION IS IDENTIFIED. - INVASIVE DUCTAL CARCINOMA IS BROADLY 0.1 CM TO THE SUPERIOR MARGIN. - DUCTAL CARCINOMA IN SITU IS FOCALLY LESS THAN 0.1 CM TO THE INFERIOR MARGIN AND FOCALLY 0.1 CM TO THE SUPERIOR MARGIN. - SEE ONCOLOGY TABLE BELOW. 2. Lymph node, sentinel, biopsy, Right - THERE IS NO EVIDENCE OF CARCINOMA IN 1 OF 1 LYMPH NODE (0/1). 1 of 3 Diagnosis(continued) 3. Lymph node, sentinel, biopsy, Right - THERE IS NO EVIDENCE OF CARCINOMA IN 1 OF 1 LYMPH NODE (0/1). 4. Lymph node, sentinel, biopsy, Right - THERE IS NO EVIDENCE OF CARCINOMA IN 1 OF 1 LYMPH NODE (0/1). Microscopic Comment 1. BREAST, INVASIVE TUMOR, WITH LYMPH NODES PRESENT Receptor Status: ER(100%), PR (90%), Her2-neu (-), Ki-(10%)  Did patient present with symptoms (if so, please note symptoms) or was this found on screening mammography?: Kathryn Ortiz husband felt a lump in the right breast and instructed her to get tested.  Past/Anticipated interventions by surgeon, if any:Dr. Marlou Starks 09-25-15 Right lumpectomy SLN  Past/Anticipated interventions by medical oncology, if XBM:[id number] does not have any interest in adjuvant chemotherapy and hence I did not see any point in sending for Oncotype DX testing.  Recommendation: 1. adjuvant XRT 2. Anti-estrogen therapy Anastrozole training done She may be considered for PALLAS clinical trial   Lymphedema issues, if any:  Yes  Pain issues, if any: No Arm movement: Able to raise right arm without discomfort.  SAFETY ISSUES:  Prior radiation: No  Pacemaker/ICD? : No  Possible current pregnancy?: No  Is the patient on methotrexate?:No  Current Complaints /  other details: Here to discuss her radiation plan  Menarche age 60, G24,P3, BC  IUD  20 years , HRTNo BP 137/69 mmHg  Pulse 71  Temp(Src) 97.7 F (36.5 C) (Oral)  Resp 16  Ht 4' 11" (1.499 m)  Wt 144 lb 11.2 oz (65.635 kg)  BMI 29.21 kg/m2  SpO2 100%  Kathryn Spurling, RN 10/19/2015,3:59 PM

## 2015-10-21 DIAGNOSIS — R55 Syncope and collapse: Secondary | ICD-10-CM | POA: Diagnosis not present

## 2015-10-21 DIAGNOSIS — R002 Palpitations: Secondary | ICD-10-CM | POA: Diagnosis not present

## 2015-10-21 DIAGNOSIS — D329 Benign neoplasm of meninges, unspecified: Secondary | ICD-10-CM | POA: Diagnosis not present

## 2015-10-22 ENCOUNTER — Encounter: Payer: Self-pay | Admitting: Radiation Oncology

## 2015-10-22 ENCOUNTER — Ambulatory Visit
Admission: RE | Admit: 2015-10-22 | Discharge: 2015-10-22 | Disposition: A | Discharge status: Home / Self Care | Payer: Medicare Other | Source: Ambulatory Visit | Attending: Radiation Oncology | Admitting: Radiation Oncology

## 2015-10-22 ENCOUNTER — Other Ambulatory Visit: Payer: Self-pay | Admitting: Internal Medicine

## 2015-10-22 VITALS — BP 137/69 | HR 71 | Temp 97.7°F | Resp 16 | Ht 59.0 in | Wt 144.7 lb

## 2015-10-22 DIAGNOSIS — C50211 Malignant neoplasm of upper-inner quadrant of right female breast: Secondary | ICD-10-CM

## 2015-10-22 DIAGNOSIS — Z170 Estrogen receptor positive status [ER+]: Secondary | ICD-10-CM | POA: Insufficient documentation

## 2015-10-22 DIAGNOSIS — C50911 Malignant neoplasm of unspecified site of right female breast: Secondary | ICD-10-CM | POA: Insufficient documentation

## 2015-10-22 DIAGNOSIS — R55 Syncope and collapse: Secondary | ICD-10-CM

## 2015-10-22 NOTE — Progress Notes (Addendum)
Radiation Oncology         [phone]) [phone] ________________________________  Initial Outpatient Consultation - Date: 10/22/2015   Name: Kathryn Ortiz MRN: [id number]   DOB: 08-14-1940  REFERRING PHYSICIAN: Jani Gravel, MD  DIAGNOSIS AND STAGE:  T2 N0 invasive ductal carcinoma of the right breast  HISTORY OF PRESENT ILLNESS::Kathryn Ortiz is a 75 y.o. female. Her husband palpated a right breast mass. She delayed work up due to a recent knee replacement. Her mammogram was performed on 07/13/2016 which showed a 1.7 cm mass in the right breast with suspicion of pectoralis muscle invasion. Lymph nodes with cortical thickening were noted. Biopsy on 07/28/2015 was positive for invasive ductal carcinoma. This was ER/PR positive and Her2 negative. She had a breast MRI which showed the lesion to be slightly larger at 2.4 cm. The tumor did not involve the muscle. She elected for breast conservation and had a lumpectomy on 09/28/15. This showed a 2.1 cm invasive ductal carcinoma with lymphovascular invasion and perineural invasion. Her superior margin was broadly close at 1 mm and she had two focally close margins of DCIS. Three sentinel lymph nodes were negative for malignancy. She met with Dr. Lindi Adie who did not feel she required chemotherapy. She has healed up well from her surgery and presents today for my opinion regarding radiation in the management of her disease.   Today, she reports experiencing lymphedema. However, today she is able to raise her right arm without discomfort. She denies any pain. Her husband reports that since having her surgical procedure done, she has been drained, experiencing dizziness, and syncope. She is awaiting multiple test results to determine the source of these issues. She complains of fatigue and inability to stay awake for more than an hour. She reports taking trazadone, hydrocodone and clonazepam.   PREVIOUS RADIATION THERAPY: No  Past medical, social and family history  were reviewed in the electronic chart. Review of symptoms was reviewed in the electronic chart. Medications were reviewed in the electronic chart.   PHYSICAL EXAM:  Filed Vitals:   10/22/15 0848  BP: 137/69  Pulse: 71  Temp: 97.7 F (36.5 C)  Resp: 16    This is a well appearing female in no acute distress. She is oriented but somnolent.  . Her incision is healing really well. She has a small seroma underneath her right arm.   IMPRESSION: Kathryn Ortiz is a 75 y.o female with T2 N0 invasive ductal carcinoma of the right breast.  PLAN: I spoke to the patient today regarding her diagnosis and options for treatment. We discussed the equivalence in terms of survival and local failure between mastectomy and breast conservation. We discussed the role of radiation in decreasing local failures in patients who undergo lumpectomy. We discussed the process of simulation and the placement tattoos. We discussed 4-6 weeks of treatment as an outpatient. We discussed the possibility of asymptomatic lung damage. We discussed the low likelihood of secondary malignancies. We discussed the possible side effects including but not limited to skin redness, fatigue, permanent skin darkening, and breast swelling.   We will plan to follow up in 1 month after she and her PCP discover the cause of her drowsiness and syncope spells.  I spent 25 minutes  face to face with the patient and more than 50% of that time was spent in counseling and/or coordination of care.   ------------------------------------------------  Thea Silversmith, MD  This document serves as a record of services personally performed by Thea Silversmith,  MD. It was created on her behalf by Jenell Milliner, a trained medical scribe. The creation of this record is based on the scribe's personal observations and the provider's statements to them. This document has been checked and approved by the attending provider.

## 2015-10-23 ENCOUNTER — Other Ambulatory Visit (HOSPITAL_COMMUNITY): Payer: Self-pay | Admitting: Internal Medicine

## 2015-10-23 DIAGNOSIS — R55 Syncope and collapse: Secondary | ICD-10-CM

## 2015-10-23 NOTE — Addendum Note (Signed)
Encounter addended by: Malena Edman, RN on: 10/23/2015  8:50 AM<BR>     Documentation filed: Charges VN

## 2015-10-26 ENCOUNTER — Other Ambulatory Visit: Payer: Self-pay | Admitting: Internal Medicine

## 2015-10-26 ENCOUNTER — Ambulatory Visit (HOSPITAL_COMMUNITY): Payer: Medicare Other

## 2015-10-26 ENCOUNTER — Ambulatory Visit (INDEPENDENT_AMBULATORY_CARE_PROVIDER_SITE_OTHER): Payer: Medicare Other | Admitting: Neurology

## 2015-10-26 ENCOUNTER — Other Ambulatory Visit (HOSPITAL_COMMUNITY): Payer: Self-pay | Admitting: Internal Medicine

## 2015-10-26 ENCOUNTER — Encounter: Payer: Self-pay | Admitting: Neurology

## 2015-10-26 VITALS — BP 144/73 | HR 105 | Ht 60.0 in | Wt 146.4 lb

## 2015-10-26 DIAGNOSIS — R400 Somnolence: Secondary | ICD-10-CM

## 2015-10-26 DIAGNOSIS — R55 Syncope and collapse: Secondary | ICD-10-CM

## 2015-10-26 DIAGNOSIS — R7989 Other specified abnormal findings of blood chemistry: Secondary | ICD-10-CM

## 2015-10-26 DIAGNOSIS — R0683 Snoring: Secondary | ICD-10-CM | POA: Diagnosis not present

## 2015-10-26 DIAGNOSIS — R404 Transient alteration of awareness: Secondary | ICD-10-CM

## 2015-10-26 NOTE — Progress Notes (Signed)
[facility]    Provider:  [name] Referring Provider: Jani Gravel, MD Primary Care Physician:  Jani Gravel, MD  CC:  Syncope  HPI:  Kathryn Ortiz is a 75 y.o. female here as a referral from Dr. Maudie Mercury for dizziness and fainting. She has a past medical history of syncope, hypertension, hyperlipidemia, anxiety, depression, breast cancer. Patient was diagnosed with breast cancer of the upper quadrant of the right breast, she had breast lumpectomy with radioactive seeds and sentinel lymph node biopsy on 09/28/2015. Pathology showed invasive ductal carcinoma in situ stage II a She hasn't been feeling good since her surgery on March 6th. Since then she feels very weak and dizzy. And she feels like she is going to pass out, gets lightheaded, SOB, sweating, if she sits down she feels better. She did have one syncopal episode, felt like she was going to pass out and then had a syncopal episode. When she passed out, she went down onto the floor of the kitchen, she went limp, eyes closed for 2-5 minutes, no abnormal movements, she unrinated, no abnormal movements or eyes rolled back,  minutes her eyes closed. She is here with husband who provides information. They did not check blood sugar or the blood pressure. No confusion when her eyes opened and in 20 minutes felt better with coke and sugar. This has happened before in the setting of hypoglycemia. She has decreased appetite, she has lost a lot of weight, she is not hungry, doesn't want to eat. She has nausea. She had not eaten that day of the syncopal event and it was 3pm. They did not call 911 or go to the emergency room. The day she fell, she hadn't eaten and she had also taken the clonazepam, hydrocodone, gabapentin without eating all day. She snores excessively, she is excessively tired during the day, she will wake her husband up. She wakes up with dry mouth. Nap frequently and then sleep at night for 10 hours sometimes. Here with her husband  of over 63 years who also provides information. No other focal neurologic deficits or complaints. No family or personal history of seizures. She does have a previous history of meningioma.  Reviewed notes, labs and imaging from outside physicians, which showed:  The patient is a 75 year old white female who was initially found to have a 1.7 cm cancer in the upper inner right breast. It was thought to be invading the pectoralis muscle but her recent MRI did not show this invasion. It measured 2.4 cm by MRI. It was ER and PR positive and HER-2 negative with a Ki-67 of 10%.BREAST LUMPECTOMY WITH RADIOACTIVE SEED AND SENTINEL LYMPH NODE BIOPSY (Right) as a surgical intervention. Reviewed operative notes dated 09/28/2015. Patient did not have any interest in adjuvant chemotherapy. Adjuvant XRT, anti-estrogen therapy were recommended. Pathology results: IDC grade 1, 2.1 cm, LG DCIS, LVI and PNI, DCIS focally less than 1 mm, 0/3 LN, T2N0 (Stage 2A),  Repeat MRI of the brain November 2009: 8.3 mm meningioma at the left aspect of the posterior falx without surrounding vasogenic edema. Personally reviewed images and agree.  Review of Systems: Patient complains of symptoms per HPI as well as the following symptoms: Weight loss, fatigue, blurred vision, easy bruising, cough, wheezing, increased thirst, incontinence, joint pain, itching, incontinence, runny nose, headache, weakness, dizziness, passing out, sleepiness, restless legs, anxiety, depression, change in appetite. Pertinent negatives per HPI. All others negative.   Social History   Social History  . Marital Status:  Married    Spouse Name: [name]  . Number of Children: 3  . Years of Education: 12 +   Occupational History  .      retired   Social History Main Topics  . Smoking status: Never Smoker   . Smokeless tobacco: Current User    Types: Chew, Snuff     Comment: 10/26/15 snuff 1 tsp daily  . Alcohol Use: No  . Drug Use: No  . Sexual  Activity: Yes    Birth Control/ Protection: Post-menopausal   Other Topics Concern  . Not on file   Social History Narrative   Lives at home with husband   Caffeine - coffee 2 cups daily    Family History  Problem Relation Age of Onset  . Hypertension Other   . Cancer Sister     breast  . Seizures Neg Hx     Past Medical History  Diagnosis Date  . Insomnia     takes Trazodone nightly  . Weakness     tingling in hands and feet  . Arthritis   . Joint pain   . Joint swelling   . Back pain     from knee pain  . Urinary frequency   . Sciatica   . Macular degeneration     dry   . Osteoarthritis of right knee, primary localized 04/14/2015  . GERD (gastroesophageal reflux disease)     takes Omeprazole daily  . Hyperlipidemia     takes Lovastatin 2 times a week  . Hypertension     was on meds but has been off x 2 yr  . Anxiety     takes Clonazepam daily as needed  . Depression     takes Celexa daily  . S/P total knee arthroplasty     R    Past Surgical History  Procedure Laterality Date  . Shoulder surgery Bilateral   . Colonoscopy    . Epidural injections    . Cataract surgery Bilateral   . Breast surgery      breast biopsy  . Total knee arthroplasty Right 01/12/2015  . Total knee arthroplasty Right 04/14/2015    Procedure: TOTAL KNEE ARTHROPLASTY;  Surgeon: Marchia Bond, MD;  Location: [facility];  Service: Orthopedics;  Laterality: Right;  . Breast lumpectomy with radioactive seed and sentinel lymph node biopsy Right 09/28/2015    Procedure: BREAST LUMPECTOMY WITH RADIOACTIVE SEED AND SENTINEL LYMPH NODE BIOPSY;  Surgeon: Autumn Messing III, MD;  Location: [facility];  Service: General;  Laterality: Right;    Current Outpatient Prescriptions  Medication Sig Dispense Refill  . anastrozole (ARIMIDEX) 1 MG tablet Take 1 tablet (1 mg total) by mouth daily. 90 tablet 3  . Cholecalciferol (VITAMIN D PO) Take 1 tablet by mouth daily.    . clonazePAM (KLONOPIN)  1 MG tablet Take 0.5 mg by mouth daily as needed for anxiety.    . Cyanocobalamin (VITAMIN B12 PO) Take 1 tablet by mouth daily. Reported on 10/22/2015    . gabapentin (NEURONTIN) 300 MG capsule Take 300 mg by mouth at bedtime.    Marland Kitchen HYDROcodone-acetaminophen (NORCO) 7.5-325 MG tablet Take 1 tablet by mouth 2 (two) times daily.  0  . lovastatin (MEVACOR) 10 MG tablet Take 10 mg by mouth 2 (two) times a week.    Marland Kitchen omeprazole (PRILOSEC) 20 MG capsule Take 20 mg by mouth daily.    . traZODone (DESYREL) 100 MG tablet Take 250 mg by mouth at bedtime.  No current facility-administered medications for this visit.    Allergies as of 10/26/2015 - Review Complete 10/26/2015  Allergen Reaction Noted  . Penicillins Hives, Itching, and Other (See Comments) 04/02/2015    Vitals: BP 144/73 mmHg  Pulse 105  Ht 5' (1.524 m)  Wt 146 lb 6.4 oz (66.407 kg)  BMI 28.59 kg/m2 Last Weight:  Wt Readings from Last 1 Encounters:  10/26/15 146 lb 6.4 oz (66.407 kg)   Last Height:   Ht Readings from Last 1 Encounters:  10/26/15 5' (1.524 m)    Physical exam: Exam: Gen: NAD, conversant                     CV: tachycardic, no MRG. No Carotid Bruits. No peripheral edema, warm, nontender Eyes: Conjunctivae clear without exudates or hemorrhage  Neuro: Detailed Neurologic Exam  Speech:    Speech is normal; fluent and spontaneous with normal comprehension.  Cognition:    The patient is oriented to person, place, and time;     recent and remote memory intact;     language fluent;     normal attention, concentration,     fund of knowledge Cranial Nerves:    The pupils are equal, round, and reactive to light. Coud not visualize fundi, attempted, due to small pupils. Visual fields are full to finger confrontation. Extraocular movements are intact. Trigeminal sensation is intact and the muscles of mastication are normal. The face is symmetric. The palate elevates in the midline. Hearing intact. Voice is  normal. Shoulder shrug is normal. The tongue has normal motion without fasciculations.   Coordination:    Normal finger to nose and heel to shin.   Gait:    Slightly wide based  Motor Observation:    No asymmetry, no atrophy, and no involuntary movements noted. Tone:    Normal muscle tone.    Posture:    Posture is normal. normal erect    Strength:Mild prox weakness due to recent lumpectomy on the right and previous surgery on the left (rotator cuff) and pain, otherwise strength is V/V in the upper and lower limbs.      Sensation: intact to LT     Reflex Exam:  DTR's: deferred right knee due to recent replacement otherwise deep tendon reflexes in the upper and lower extremities are brisk bilaterally.   Toes:    The toes are downgoing bilaterally.   Clonus:    Clonus is absent.      Assessment/Plan:   75 y.o. female here as a referral from Dr. Maudie Mercury for dizziness and fainting. She has a past medical history of syncope, hypertension, hyperlipidemia, anxiety, depression, breast cancer. Patient was diagnosed with breast cancer of the upper quadrant of the right breast, she had breast lumpectomy with radioactive seeds and sentinel lymph node biopsy on 09/28/2015. Pathology showed invasive ductal carcinoma in situ stage II a She hasn't been feeling good since her surgery on March 6th. Since then she feels very weak and dizzy. And she feels like she is going to pass out, gets lightheaded, SOB, sweating, if she sits down she feels better.   Syncopal episode in the setting of not eating all day long (was 3pm) and felt better after eating. May be hypoglycemia or dehydration and hypotension. Less likely seizure. We'll order EEG. Primary doctor has scheduled carotid dopplers and MRI of the brain Advise pcp to assess for cardiovascular causes Snoring excessively and weight gain up husband, sleeping excessively, has been going on  for years. Also reports memory changes. Epworth Sleepiness Scale  16. We will refer for sleep study. Patient is unable to drive, operate heavy machinery, perform activities at heights or participate in water activities until 6 months seizure free Follow-up in 3 months.  Sarina Ill, MD  [facility] [address] [place],  [address]  Phone [phone] Fax [phone]

## 2015-10-26 NOTE — Patient Instructions (Addendum)
Remember to drink plenty of fluid, eat healthy meals and do not skip any meals. Try to eat protein with a every meal and eat a healthy snack such as fruit or nuts in between meals. Try to keep a regular sleep-wake schedule and try to exercise daily, particularly in the form of walking, 20-30 minutes a day, if you can.   As far as diagnostic testing: EEG, labs, sleep evaluation  I would like to see you back in 3 months, sooner if we need to. Please call us with any interim questions, concerns, problems, updates or refill requests.   Our phone number is [phone]. We also have an after hours call service for urgent matters and there is a physician on-call for urgent questions. For any emergencies you know to call 911 or go to the nearest emergency room

## 2015-10-27 ENCOUNTER — Ambulatory Visit (HOSPITAL_COMMUNITY)
Admission: RE | Admit: 2015-10-27 | Discharge: 2015-10-27 | Disposition: A | Discharge status: Home / Self Care | Payer: Medicare Other | Source: Ambulatory Visit | Attending: Internal Medicine | Admitting: Internal Medicine

## 2015-10-27 ENCOUNTER — Telehealth: Payer: Self-pay | Admitting: *Deleted

## 2015-10-27 DIAGNOSIS — R55 Syncope and collapse: Secondary | ICD-10-CM | POA: Insufficient documentation

## 2015-10-27 DIAGNOSIS — I6529 Occlusion and stenosis of unspecified carotid artery: Secondary | ICD-10-CM

## 2015-10-27 HISTORY — DX: Occlusion and stenosis of unspecified carotid artery: I65.29

## 2015-10-27 LAB — BASIC METABOLIC PANEL
BUN/Creatinine Ratio: 13 (ref 12–28)
BUN: 15 mg/dL (ref 8–27)
CALCIUM: 9.8 mg/dL (ref 8.7–10.3)
CO2: 24 mmol/L (ref 18–29)
CREATININE: 1.2 mg/dL — AB (ref 0.57–1.00)
Chloride: 100 mmol/L (ref 96–106)
GFR calc Af Amer: 51 mL/min/{1.73_m2} — ABNORMAL LOW (ref >59)
GFR, EST NON AFRICAN AMERICAN: 44 mL/min/{1.73_m2} — AB (ref >59)
Glucose: 138 mg/dL — ABNORMAL HIGH (ref 65–99)
POTASSIUM: 4.4 mmol/L (ref 3.5–5.2)
Sodium: 141 mmol/L (ref 134–144)

## 2015-10-27 LAB — CBC
HEMATOCRIT: 39.5 % (ref 34.0–46.6)
HEMOGLOBIN: 13.2 g/dL (ref 11.1–15.9)
MCH: 29.4 pg (ref 26.6–33.0)
MCHC: 33.4 g/dL (ref 31.5–35.7)
MCV: 88 fL (ref 79–97)
Platelets: 246 10*3/uL (ref 150–379)
RBC: 4.49 x10E6/uL (ref 3.77–5.28)
RDW: 14.3 % (ref 12.3–15.4)
WBC: 5.1 10*3/uL (ref 3.4–10.8)

## 2015-10-27 NOTE — Telephone Encounter (Signed)
Called and spoke to pt about labs per Dr ahern note. Pt verbalized understanding.  

## 2015-10-27 NOTE — Progress Notes (Signed)
VASCULAR LAB PRELIMINARY  PRELIMINARY  PRELIMINARY  PRELIMINARY  Carotid duplex  completed.    Preliminary report:  Bilateral:  1-39% ICA stenosis.  Vertebral artery flow is antegrade.      Wahneta Derocher, RVT 10/27/2015, 4:11 PM

## 2015-10-27 NOTE — Telephone Encounter (Signed)
-----   Message from Melvenia Beam, MD sent at 10/27/2015  8:01 AM EDT ----- She has anemia which is stable as compared to 6 months ago. She also has elevated creatinine that is a reflection of kidney function but that is stable as well. Otherwise looks ok. Nothing acute.  thanks

## 2015-10-28 ENCOUNTER — Encounter: Payer: Self-pay | Admitting: Neurology

## 2015-10-28 DIAGNOSIS — Z1389 Encounter for screening for other disorder: Secondary | ICD-10-CM | POA: Diagnosis not present

## 2015-10-28 DIAGNOSIS — I10 Essential (primary) hypertension: Secondary | ICD-10-CM | POA: Diagnosis not present

## 2015-10-28 DIAGNOSIS — E785 Hyperlipidemia, unspecified: Secondary | ICD-10-CM | POA: Diagnosis not present

## 2015-10-28 DIAGNOSIS — M25562 Pain in left knee: Secondary | ICD-10-CM | POA: Diagnosis not present

## 2015-10-28 DIAGNOSIS — W19XXXD Unspecified fall, subsequent encounter: Secondary | ICD-10-CM | POA: Diagnosis not present

## 2015-10-29 ENCOUNTER — Ambulatory Visit (HOSPITAL_COMMUNITY)
Admission: RE | Admit: 2015-10-29 | Discharge: 2015-10-29 | Disposition: A | Discharge status: Home / Self Care | Payer: Medicare Other | Source: Ambulatory Visit | Attending: Internal Medicine | Admitting: Internal Medicine

## 2015-10-29 ENCOUNTER — Encounter (HOSPITAL_COMMUNITY)
Admission: RE | Admit: 2015-10-29 | Discharge: 2015-10-29 | Disposition: A | Discharge status: Home / Self Care | Payer: Medicare Other | Source: Ambulatory Visit | Attending: Internal Medicine | Admitting: Internal Medicine

## 2015-10-29 ENCOUNTER — Institutional Professional Consult (permissible substitution): Payer: Medicare Other | Admitting: Neurology

## 2015-10-29 DIAGNOSIS — R0602 Shortness of breath: Secondary | ICD-10-CM | POA: Diagnosis not present

## 2015-10-29 DIAGNOSIS — R791 Abnormal coagulation profile: Secondary | ICD-10-CM | POA: Insufficient documentation

## 2015-10-29 DIAGNOSIS — R0600 Dyspnea, unspecified: Secondary | ICD-10-CM | POA: Diagnosis not present

## 2015-10-29 DIAGNOSIS — R7989 Other specified abnormal findings of blood chemistry: Secondary | ICD-10-CM

## 2015-10-29 DIAGNOSIS — R55 Syncope and collapse: Secondary | ICD-10-CM

## 2015-10-29 MED ORDER — TECHNETIUM TO 99M ALBUMIN AGGREGATED
4.4000 | Freq: Once | INTRAVENOUS | Status: AC | PRN
  Administered 2015-10-29: 4.4 via INTRAVENOUS

## 2015-10-29 MED ORDER — TECHNETIUM TC 99M DIETHYLENETRIAME-PENTAACETIC ACID: 32.1000 | Freq: Once | INTRAVENOUS | Status: DC | PRN

## 2015-10-30 ENCOUNTER — Encounter: Payer: Self-pay | Admitting: *Deleted

## 2015-11-02 ENCOUNTER — Ambulatory Visit (INDEPENDENT_AMBULATORY_CARE_PROVIDER_SITE_OTHER): Payer: Medicare Other | Admitting: Neurology

## 2015-11-02 ENCOUNTER — Encounter: Payer: Self-pay | Admitting: Neurology

## 2015-11-02 VITALS — BP 130/80 | HR 66 | Resp 14 | Ht 60.0 in | Wt 144.5 lb

## 2015-11-02 DIAGNOSIS — C50011 Malignant neoplasm of nipple and areola, right female breast: Secondary | ICD-10-CM | POA: Diagnosis not present

## 2015-11-02 DIAGNOSIS — G4719 Other hypersomnia: Secondary | ICD-10-CM

## 2015-11-02 NOTE — Progress Notes (Signed)
SLEEP MEDICINE CLINIC   Provider:  Melvyn Novas, M D  Referring Provider: [name], MD Primary Care Physician:  [name], MD  Chief Complaint  Patient presents with  . New Evaluation    New sleep consult. w/ husband. Has not had a previous sleep study.     HPI:  Kathryn Ortiz is a 75 y.o. female of  [place] , seen here as a referral  from Dr. [name] for a sleep consultation.  The patient is established with Dr. [name] home she sought just on 10/28/2015 last week. I called here the patient was seen for a possible syncope she had decreased appetite and had lost weight. Kathryn Ortiz husband had reported to Dr. [name] that the patient is also a loud snorer but she also takes several medications but will contribute to an higher degree of sleepiness, muscle relaxation and fatigue including clonazepam, hydrocodone, gabapentin. The patient had also reported that she wakes up with a dry mouth. She naps frequently during the day and sometimes in addition to that will stay in bed and sleep between 8 and 10 hours.  Chief complaint according to patient : I am sleepy, fatigued "    Sleep habits are as follows: She frequently naps already on the couch, 2-3 hours in early evening, before transferring to the bedroom. This can be any time between 8 Pm and 1 AM. But the patient usually aims for about 10 PM. I less she takes trazodone she will not fall asleep easily and some nights she will not sleep. Her husband has a quite different perception and state that she sleeps that she just feels that she can't sleep. He has witnessed her to snore and fall into a deep sleep in the bedroom almost every night.   Her husband has not found her to snore louder when she is supine.  She is a side sleeper by habit, the patient's bedroom is described as cool, quiet and dark. She sleeps on one pillow, she uses a therapeutic pillow with memory foam. She is that she can stay asleep as long as she is left alone  usually she does not wake up from palpitations, shortness of breath, diaphoresis, vivid dreams, she reports she dreams plenty. She does not report headaches when she wakes, but a dry mouth. Most mornings the patient will sleep in by her husband goes to work. Between 10 AM and 11 AM is her rise time, averaging 12 hours or more of sleep according to her husband's report. Her husband made coffee each mornings, and places some food for her, but she takes her medications on an empty stomach. She is spending the day alone at home. Cannot drive since January 6th.   Sleep medical history and family sleep history: father was a snorer, has one son , who is alcoholic and has OSA.  Social history: married, 3 children, now grown. She reports 2 kittens in the house, her husband doesn't like.  Worked with [employer] [place] [employer].   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 [name] for dizziness and fainting. She has a past medical history of syncope, hypertension, hyperlipidemia, anxiety, depression, breast cancer. Patient was diagnosed with breast cancer of the upper quadrant of the right breast, she had breast lumpectomy with radioactive seeds and sentinel lymph node biopsy on 09/28/2015. 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 Since then she feels very weak and dizzy. And she feels like she is going to pass out, gets lightheaded, SOB, sweating, if she sits down she feels better. She did have one syncopal episode, felt like she was going to pass out and then had a syncopal episode. When she passed out, she went down onto the floor of the kitchen, she went limp, eyes closed for 2-5 minutes, no abnormal movements, she unrinated, no abnormal movements or eyes rolled back,  minutes her eyes closed. She is here with husband who provides information. They did not check blood  sugar or the blood pressure. No confusion when her eyes opened and in 20 minutes felt better with coke and sugar. This has happened before in the setting of hypoglycemia. She has decreased appetite, she has lost a lot of weight, she is not hungry, doesn't want to eat. She has nausea. She had not eaten that day of the syncopal event and it was 3pm. They did not call 911 or go to the emergency room. The day she fell, she hadn't eaten and she had also taken the clonazepam, hydrocodone, gabapentin without eating all day. She snores excessively, she is excessively tired during the day, she will wake her husband up. She wakes up with dry mouth. Nap frequently and then sleep at night for 10 hours sometimes. Here with her husband of over 51 years who also provides information. No other focal neurologic deficits or complaints. No family or personal history of seizures. She does have a previous history of meningioma.  Reviewed notes, labs and imaging from outside physicians, which showed:  The patient is a 75 year old white female who was initially found to have a 1.7 cm cancer in the upper inner right breast. It was thought to be invading the pectoralis muscle but her recent MRI did not show this invasion. It measured 2.4 cm by MRI. It was ER and PR positive and HER-2 negative with a Ki-67 of 10%.BREAST LUMPECTOMY WITH RADIOACTIVE SEED AND SENTINEL LYMPH NODE BIOPSY (Right) as a surgical intervention. Reviewed operative notes dated 09/28/2015. Patient did not have any interest in adjuvant chemotherapy. Adjuvant XRT, anti-estrogen therapy were recommended. Pathology results: IDC grade 1, 2.1 cm, LG DCIS, LVI and PNI, DCIS focally less than 1 mm, 0/3 LN, T2N0 (Stage 2A), Repeat MRI of the brain November 2009: 8.3 mm meningioma at the left aspect of the posterior falx without surrounding vasogenic edema. Personally reviewed images and agree.[name] scheduled her for MRI and EEG here at [facility].  Review of Systems: Out  of a complete 14 system review, the patient complains of only the following symptoms, and all other reviewed systems are negative.   Epworth score 16 , Fatigue severity score 59  , geriatric depression score 10   Social History   Social History  . Marital Status: Married    Spouse Name: [name]  . Number of Children: 3  . Years of Education: 12 +   Occupational History  .      retired   Social History Main Topics  . Smoking status: Never Smoker   . Smokeless tobacco: Current User    Types: Chew, Snuff     Comment: 10/26/15 snuff 1 tsp daily  . Alcohol Use: No  . Drug Use: No  . Sexual Activity: Yes    Birth Control/ Protection: Post-menopausal   Other Topics Concern  . Not on file   Social History  Narrative   Lives at home with husband   Caffeine - coffee 2 cups daily    Family History  Problem Relation Age of Onset  . Hypertension Other   . Breast cancer Sister     breast  . Seizures Neg Hx   . Suicidality Father   . Heart failure Mother   . Drug abuse Daughter   . Heart Problems Brother 35    CABG  . Osteoarthritis Sister     3 shoulder surgeries, had staph infection once  . Heart Problems Brother     Past Medical History  Diagnosis Date  . Insomnia     takes Trazodone nightly  . Weakness     tingling in hands and feet  . Arthritis   . Joint pain   . Joint swelling   . Back pain     from knee pain  . Urinary frequency   . Sciatica   . Macular degeneration     dry   . Osteoarthritis of right knee, primary localized 04/14/2015  . GERD (gastroesophageal reflux disease)     takes Omeprazole daily  . Hyperlipidemia     takes Lovastatin 2 times a week  . Hypertension     was on meds but has been off x 2 yr  . Anxiety     takes Clonazepam daily as needed  . Depression     takes Celexa daily  . S/P total knee arthroplasty     R    Past Surgical History  Procedure Laterality Date  . Shoulder surgery Bilateral   . Colonoscopy    . Epidural  injections    . Cataract surgery Bilateral   . Breast surgery      breast biopsy  . Total knee arthroplasty Right 01/12/2015  . Total knee arthroplasty Right 04/14/2015    Procedure: TOTAL KNEE ARTHROPLASTY;  Surgeon: Marchia Bond, MD;  Location: [place];  Service: Orthopedics;  Laterality: Right;  . Breast lumpectomy with radioactive seed and sentinel lymph node biopsy Right 09/28/2015    Procedure: BREAST LUMPECTOMY WITH RADIOACTIVE SEED AND SENTINEL LYMPH NODE BIOPSY;  Surgeon: Autumn Messing III, MD;  Location: [place];  Service: General;  Laterality: Right;    Current Outpatient Prescriptions  Medication Sig Dispense Refill  . anastrozole (ARIMIDEX) 1 MG tablet Take 1 tablet (1 mg total) by mouth daily. 90 tablet 3  . Cholecalciferol (VITAMIN D PO) Take 1 tablet by mouth daily.    . clonazePAM (KLONOPIN) 1 MG tablet Take 0.5 mg by mouth daily as needed for anxiety.    . Cyanocobalamin (VITAMIN B12 PO) Take 1 tablet by mouth daily. Reported on 10/22/2015    . gabapentin (NEURONTIN) 300 MG capsule Take 300 mg by mouth at bedtime.    Marland Kitchen HYDROcodone-acetaminophen (NORCO) 7.5-325 MG tablet Take 1 tablet by mouth 2 (two) times daily.  0  . lovastatin (MEVACOR) 10 MG tablet Take 10 mg by mouth 2 (two) times a week.    Marland Kitchen omeprazole (PRILOSEC) 20 MG capsule Take 20 mg by mouth daily.    . traZODone (DESYREL) 100 MG tablet Take 250 mg by mouth at bedtime.     No current facility-administered medications for this visit.   Facility-Administered Medications Ordered in Other Visits  Medication Dose Route Frequency Provider Last Rate Last Dose  . technetium TC 82M diethylenetriame-pentaacetic acid (DTPA) injection 32.1 milli Curie  32.1 milli Curie Intravenous Once PRN Jani Gravel, MD  Allergies as of 11/02/2015 - Review Complete 11/02/2015  Allergen Reaction Noted  . Penicillins Hives, Itching, and Other (See Comments) 04/02/2015  . Celexa [citalopram] Other (See Comments)      Vitals: BP 130/80 mmHg  Pulse 66  Resp 14  Ht 5' (1.524 m)  Wt 144 lb 8 oz (65.545 kg)  BMI 28.22 kg/m2  SpO2 96% Last Weight:  Wt Readings from Last 1 Encounters:  11/02/15 144 lb 8 oz (65.545 kg)   YWV:PXTG mass index is 28.22 kg/(m^2).     Last Height:   Ht Readings from Last 1 Encounters:  11/02/15 5' (1.524 m)    Physical exam:  General: The patient is awake, alert and appears not in acute distress. The patient is well groomed. Head: Normocephalic, atraumatic. Neck is supple. Mallampati 4  neck circumference: 15. Nasal airflow patent, TMJ is not evident . Retrognathia is not present. Full dentures.  Cardiovascular:  Regular rate and rhythm , without  murmurs or carotid bruit, and without distended neck veins. Respiratory: Lungs are clear to auscultation. Skin:  Without evidence of edema, or rash Trunk: BMI is. The patient's posture is hunched .  Neurologic exam :The patient is awake and alert, oriented to place and time.   Memory subjective  described as intact.  Attention span & concentration ability appears normal.  Speech ; dysarthria, dysphonia , possible  aphasia.  Mood and affect are appropriate.  Cranial nerves: Pupils are equal and briskly reactive to light. Funduscopic exam without  evidence of pallor or edema. Extraocular movements  in vertical and horizontal planes intact and without nystagmus. Visual fields by finger perimetry are intact.Hearing to finger rub intact. Facial sensation intact to fine touch. Facial motor strength is symmetric and tongue and uvula move midline. Shoulder shrug was symmetrical.  Motor exam:   Normal tone, muscle bulk and symmetric strength in all extremities.    Dear Dr Maudie Mercury,  The patient was advised of the nature of the diagnosed sleep disorder , the treatment options and risks for general a health and wellness arising from not treating the condition.  I spent more than 19mnutes of face to face time with the patient. Greater  than 50% of time was spent in counseling and coordination of care. We have discussed the diagnosis and differential and I answered the patient's questions.    Assessment:  After physical and neurologic examination, review of laboratory studies,  Personal review of imaging studies, reports of other /same  Imaging studies ,  Results of polysomnography/ neurophysiology testing and pre-existing records as far as provided in visit., my assessment is   1) given that Kathryn Ortiz  Is right now at  a difficult point of her health history, I  think that part of her high fatigue and daytime sleepiness is contributed to the underlying cancer condition and the associated treatment. She is status post surgery.  She had been also on medications that could cause drowsiness. An EEG and brain MRI are ordered to rule out other causes of CNS dysfunction and sleepiness.  2) Mrs. HAnastosalso endorses a high index of depression on her geriatric depression score high index of fatigue and excessive daytime sleepiness.   3)She does have a higher grade Mallampati pale oral mucosa and still has a slight elevated BMI. She reportedly snores but her husband has not noted her to stop breathing. I would like for her to be screened for apnea.  4) sleep study is needed to rule out apnea as  a contributing cause to EDS. She has restless legs, takes gabapentin.     Plan:  Treatment plan and additional workup :  SPLIT night polysomnography, trazodone to be brought.  Non smoker but exposed to second hand smoke.  Hypoxemia suspected, will measure 02 and Co2.   RV after sleep study. EEG and MRI brain.    Asencion Partridge Matt Delpizzo MD  11/02/2015   CC: Jani Gravel, Md [address] [place], [place] [address]

## 2015-11-03 ENCOUNTER — Encounter: Payer: Self-pay | Admitting: *Deleted

## 2015-11-03 NOTE — Progress Notes (Signed)
[facility] Psychosocial Distress Screening Clinical Social Work  Clinical Social Work was referred by distress screening protocol.  The patient scored a 8 on the Psychosocial Distress Thermometer which indicates severe distress. Clinical Social Worker phoned pt to assess for distress and other psychosocial needs as her next appt at [facility] is not until 11/18/15. CSW left HIPPA compliant message for pt encouraging her to return CSW call. CSW will also attempt to follow up at future appointments once pt begins treatment.    ONCBCN DISTRESS SCREENING 10/22/2015  Screening Type Initial Screening  Distress experienced in past week (1-10) 8  Practical problem type Food  Family Problem type Children  Emotional problem type Adjusting to illness  Spiritual/Religous concerns type Relating to God  Information Concerns Type Lack of info about maintaining fitness  Physical Problem type Loss of appetitie  Physician notified of physical symptoms Yes    Clinical Social Worker follow up needed: Yes.    If yes, follow up plan:  Loren Racer, [place]  [facility] Phone: [phone] Fax: [phone]

## 2015-11-04 ENCOUNTER — Ambulatory Visit
Admission: RE | Admit: 2015-11-04 | Discharge: 2015-11-04 | Disposition: A | Discharge status: Home / Self Care | Payer: Medicare Other | Source: Ambulatory Visit | Attending: Internal Medicine | Admitting: Internal Medicine

## 2015-11-04 ENCOUNTER — Ambulatory Visit: Payer: Medicare Other | Admitting: Cardiology

## 2015-11-04 DIAGNOSIS — D329 Benign neoplasm of meninges, unspecified: Secondary | ICD-10-CM | POA: Diagnosis not present

## 2015-11-04 DIAGNOSIS — R55 Syncope and collapse: Secondary | ICD-10-CM

## 2015-11-07 ENCOUNTER — Other Ambulatory Visit: Payer: Medicare Other

## 2015-11-12 ENCOUNTER — Other Ambulatory Visit: Payer: Medicare Other

## 2015-11-13 ENCOUNTER — Ambulatory Visit (INDEPENDENT_AMBULATORY_CARE_PROVIDER_SITE_OTHER): Payer: Medicare Other | Admitting: Cardiology

## 2015-11-13 ENCOUNTER — Encounter: Payer: Self-pay | Admitting: Cardiology

## 2015-11-13 ENCOUNTER — Encounter: Payer: Self-pay | Admitting: Neurology

## 2015-11-13 VITALS — BP 130/72 | HR 65 | Ht 59.0 in | Wt 147.0 lb

## 2015-11-13 DIAGNOSIS — R55 Syncope and collapse: Secondary | ICD-10-CM

## 2015-11-13 DIAGNOSIS — R0602 Shortness of breath: Secondary | ICD-10-CM | POA: Diagnosis not present

## 2015-11-13 DIAGNOSIS — R002 Palpitations: Secondary | ICD-10-CM | POA: Diagnosis not present

## 2015-11-13 HISTORY — DX: Syncope and collapse: R55

## 2015-11-13 NOTE — Patient Instructions (Addendum)
Medication Instructions:  Your physician recommends that you continue on your current medications as directed. Please refer to the Current Medication list given to you today.  Labwork: None  ordered  Testing/Procedures: Your physician has requested that you have an echocardiogram. Echocardiography is a painless test that uses sound waves to create images of your heart. It provides your doctor with information about the size and shape of your heart and how well your heart's chambers and valves are working. This procedure takes approximately one hour. There are no restrictions for this procedure.  Your physician has recommended that you wear an event monitor. Event monitors are medical devices that record the heart's electrical activity. Doctors most often [place] these monitors to diagnose arrhythmias. Arrhythmias are problems with the speed or rhythm of the heartbeat. The monitor is a small, portable device. You can wear one while you do your normal daily activities. This is usually used to diagnose what is causing palpitations/syncope (passing out).  Follow-Up: Follow up to be determined after echocardiogram and event monitor have been reviewed by physician.  We will call you with the results.  If you need a refill on your cardiac medications before your next appointment, please call your pharmacy.  Thank you for choosing [facility]!!   Trinidad Curet, RN [phone]   Any Other Special Instructions Will Be Listed Below (If Applicable).  Echocardiogram An echocardiogram, or echocardiography, uses sound waves (ultrasound) to produce an image of your heart. The echocardiogram is simple, painless, obtained within a short period of time, and offers valuable information to your health care provider. The images from an echocardiogram can provide information such as:  Evidence of coronary artery disease (CAD).  Heart size.  Heart muscle function.  Heart valve function.  Aneurysm  detection.  Evidence of a past heart attack.  Fluid buildup around the heart.  Heart muscle thickening.  Assess heart valve function. LET [facility] CARE PROVIDER KNOW ABOUT:  Any allergies you have.  All medicines you are taking, including vitamins, herbs, eye drops, creams, and over-the-counter medicines.  Previous problems you or members of your family have had with the use of anesthetics.  Any blood disorders you have.  Previous surgeries you have had.  Medical conditions you have.  Possibility of pregnancy, if this applies. BEFORE THE PROCEDURE  No special preparation is needed. Eat and drink normally.  PROCEDURE   In order to produce an image of your heart, gel will be applied to your chest and a wand-like tool (transducer) will be moved over your chest. The gel will help transmit the sound waves from the transducer. The sound waves will harmlessly bounce off your heart to allow the heart images to be captured in real-time motion. These images will then be recorded.  You may need an IV to receive a medicine that improves the quality of the pictures. AFTER THE PROCEDURE You may return to your normal schedule including diet, activities, and medicines, unless your health care provider tells you otherwise.   This information is not intended to replace advice given to you by your health care provider. Make sure you discuss any questions you have with your health care provider.   Document Released: 07/08/2000 Document Revised: 08/01/2014 Document Reviewed: 03/18/2013 Elsevier Interactive Patient Education 2016 [employer].  Cardiac Event Monitoring A cardiac event monitor is a small recording device used to help detect abnormal heart rhythms (arrhythmias). The monitor is used to record heart rhythm when noticeable symptoms such as the following  occur:  Fast heartbeats (palpitations), such as heart racing or fluttering.  Dizziness.  Fainting or  light-headedness.  Unexplained weakness. The monitor is wired to two electrodes placed on your chest. Electrodes are flat, sticky disks that attach to your skin. The monitor can be worn for up to 30 days. You will wear the monitor at all times, except when bathing.  HOW TO USE YOUR CARDIAC EVENT MONITOR A technician will prepare your chest for the electrode placement. The technician will show you how to place the electrodes, how to work the monitor, and how to replace the batteries. Take time to practice using the monitor before you leave the office. Make sure you understand how to send the information from the monitor to your health care provider. This requires a telephone with a landline, not a cell phone. You need to:  Wear your monitor at all times, except when you are in water:  Do not get the monitor wet.  Take the monitor off when bathing. Do not swim or use a hot tub with it on.  Keep your skin clean. Do not put body lotion or moisturizer on your chest.  Change the electrodes daily or any time they stop sticking to your skin. You might need to use tape to keep them on.  It is possible that your skin under the electrodes could become irritated. To keep this from happening, try to put the electrodes in slightly different places on your chest. However, they must remain in the area under your left breast and in the upper right section of your chest.  Make sure the monitor is safely clipped to your clothing or in a location close to your body that your health care provider recommends.  Press the button to record when you feel symptoms of heart trouble, such as dizziness, weakness, light-headedness, palpitations, thumping, shortness of breath, unexplained weakness, or a fluttering or racing heart. The monitor is always on and records what happened slightly before you pressed the button, so do not worry about being too late to get good information.  Keep a diary of your activities, such as  walking, doing chores, and taking medicine. It is especially important to note what you were doing when you pushed the button to record your symptoms. This will help your health care provider determine what might be contributing to your symptoms. The information stored in your monitor will be reviewed by your health care provider alongside your diary entries.  Send the recorded information as recommended by your health care provider. It is important to understand that it will take some time for your health care provider to process the results.  Change the batteries as recommended by your health care provider. SEEK IMMEDIATE MEDICAL CARE IF:   You have chest pain.  You have extreme difficulty breathing or shortness of breath.  You develop a very fast heartbeat that persists.  You develop dizziness that does not go away.  You faint or constantly feel you are about to faint.   This information is not intended to replace advice given to you by your health care provider. Make sure you discuss any questions you have with your health care provider.   Document Released: 04/19/2008 Document Revised: 08/01/2014 Document Reviewed: 01/07/2013 Elsevier Interactive Patient Education [employer].

## 2015-11-13 NOTE — Progress Notes (Signed)
Electrophysiology Office Note   Date:  11/13/2015   ID:  Samerah, Warshawsky [birth year], MRN [id number]  PCP:  Jani Gravel, MD  Electrophysiologist:  Constance Haw, MD    Chief Complaint  Patient presents with  . New Patient (Initial Visit)  . Palpitations  . Loss of Consciousness     History of Present Illness: Kathryn Ortiz is a 75 y.o. female who presents today for electrophysiology evaluation.   She has a history of hypertension and hyperlipidemia. In March, she had an episode of syncope. She had palpitations prior to her episode of syncope. She passed in her kitchen and thought she was out for a few seconds to a couple minutes.  She says that she was making a cobbler, and felt like her right arm was not working correctly and passed out. She had no chest pain or shortness of breath, but she did have palpitations prior to her episode of syncope. She has had another near syncopal episodes in the past, but his sat down and not had frank syncope. She does not have chest pain, but she does get short of breath. She can walk on flat ground without issue, but does get short of breath climbing stairs, and at times has to sleep on multiple pillows.   Today, she denies symptoms of , chest pain, orthopnea, PND, lower extremity edema, claudication, dizziness, bleeding, or neurologic sequela. The patient is tolerating medications without difficulties and is otherwise without complaint today.    Past Medical History  Diagnosis Date  . Insomnia     takes Trazodone nightly  . Weakness     tingling in hands and feet  . Arthritis   . Joint pain   . Joint swelling   . Back pain     from knee pain  . Urinary frequency   . Sciatica   . Macular degeneration     dry   . Osteoarthritis of right knee, primary localized 04/14/2015  . GERD (gastroesophageal reflux disease)     takes Omeprazole daily  . Hyperlipidemia     takes Lovastatin 2 times a week  . Hypertension     was on meds but has  been off x 2 yr  . Anxiety     takes Clonazepam daily as needed  . Depression     takes Celexa daily  . S/P total knee arthroplasty     R   Past Surgical History  Procedure Laterality Date  . Shoulder surgery Bilateral   . Colonoscopy    . Epidural injections    . Cataract surgery Bilateral   . Breast surgery      breast biopsy  . Total knee arthroplasty Right 01/12/2015  . Total knee arthroplasty Right 04/14/2015    Procedure: TOTAL KNEE ARTHROPLASTY;  Surgeon: Marchia Bond, MD;  Location: [place];  Service: Orthopedics;  Laterality: Right;  . Breast lumpectomy with radioactive seed and sentinel lymph node biopsy Right 09/28/2015    Procedure: BREAST LUMPECTOMY WITH RADIOACTIVE SEED AND SENTINEL LYMPH NODE BIOPSY;  Surgeon: Autumn Messing III, MD;  Location: [facility];  Service: General;  Laterality: Right;     Current Outpatient Prescriptions  Medication Sig Dispense Refill  . anastrozole (ARIMIDEX) 1 MG tablet Take 1 tablet (1 mg total) by mouth daily. 90 tablet 3  . Cholecalciferol (VITAMIN D PO) Take 1 tablet by mouth daily.    . clonazePAM (KLONOPIN) 1 MG tablet Take 0.5 mg by mouth daily  as needed for anxiety.    . Cyanocobalamin (VITAMIN B12 PO) Take 1 tablet by mouth daily. Reported on 10/22/2015    . gabapentin (NEURONTIN) 300 MG capsule Take 300 mg by mouth at bedtime.    Marland Kitchen HYDROcodone-acetaminophen (NORCO) 7.5-325 MG tablet Take 1 tablet by mouth 2 (two) times daily.  0  . lovastatin (MEVACOR) 10 MG tablet Take 10 mg by mouth 2 (two) times a week.    Marland Kitchen omeprazole (PRILOSEC) 20 MG capsule Take 20 mg by mouth daily.    . traZODone (DESYREL) 100 MG tablet Take 250 mg by mouth at bedtime.     No current facility-administered medications for this visit.    Allergies:   Penicillins and Celexa   Social History:  The patient  reports that she has never smoked. Her smokeless tobacco use includes Chew and Snuff. She reports that she does not drink alcohol or use  illicit drugs.   Family History:  The patient's family history includes Breast cancer in her sister; Drug abuse in her daughter; Heart Problems in her brother; Heart Problems (age of onset: 75) in her brother; Heart failure in her mother; Hypertension in her other; Osteoarthritis in her sister; Suicidality in her father. There is no history of Seizures.    ROS:  Please see the history of present illness.   Otherwise, review of systems is positive for fatigue, orthopnea, cough, snoring, wheezing, constipation, depression, anxiety, balance issues, muscle pain, dizziness.   All other systems are reviewed and negative.    PHYSICAL EXAM: VS:  BP 130/72 mmHg  Pulse 65  Ht 4\' 11"  (1.499 m)  Wt 147 lb (66.679 kg)  BMI 29.67 kg/m2 , BMI Body mass index is 29.67 kg/(m^2). GEN: Well nourished, well developed, in no acute distress HEENT: normal Neck: no JVD, carotid bruits, or masses Cardiac: RRR; no murmurs, rubs, or gallops,no edema  Respiratory:  clear to auscultation bilaterally, normal work of breathing GI: soft, nontender, nondistended, + BS MS: no deformity or atrophy Skin: warm and dry Neuro:  Strength and sensation are intact Psych: euthymic mood, full affect  EKG:  EKG is ordered today. The ekg ordered today shows sinus rhythm, nonspecific T wave abnormalities, rate 65   Recent Labs: 04/17/2015: Hemoglobin 9.1* 10/26/2015: BUN 15; Creatinine, Ser 1.20*; Platelets 246; Potassium 4.4; Sodium 141    Lipid Panel     Component Value Date/Time   CHOL  06/01/2008 0407    172        ATP III CLASSIFICATION:  <200     mg/dL   Desirable  200-239  mg/dL   Borderline High  >=240    mg/dL   High   TRIG 92 06/01/2008 0407   HDL 49 06/01/2008 0407   CHOLHDL 3.5 06/01/2008 0407   VLDL 18 06/01/2008 0407   LDLCALC * 06/01/2008 0407    105        Total Cholesterol/HDL:CHD Risk Coronary Heart Disease Risk Table                     Men   Women  1/2 Average Risk   3.4   3.3     Wt  Readings from Last 3 Encounters:  11/13/15 147 lb (66.679 kg)  11/02/15 144 lb 8 oz (65.545 kg)  10/26/15 146 lb 6.4 oz (66.407 kg)      Other studies Reviewed: Additional studies/ records that were reviewed today include: PCP notes   ASSESSMENT AND PLAN:  1.  Syncope: Currently she is feeling well, but she did have an episode of syncope last month. She had palpitations prior to her episode of syncope, and does have episodic palpitations a few times a month. She has had similar episodes of near syncope in the past which were improved by sitting down. Due to her constellation of symptoms, we'll fit her with a 30 day monitor to further evaluate any arrhythmic causes of syncope.I have told her that if she is not to drive by [facility] law for for 6 months from the date that she had syncope.  2. Shortness of breath: We'll get an echo to further evaluate her shortness of breath. It is possible that her EF is decreased. Do not hear any murmurs that would point towards valvular heart disease.    Current medicines are reviewed at length with the patient today.   The patient does not have concerns regarding her medicines.  The following changes were made today:  none  Labs/ tests ordered today include:  Orders Placed This Encounter  Procedures  . Cardiac event monitor  . EKG 12-Lead  . ECHOCARDIOGRAM COMPLETE     Disposition:   FU with Will Camntz pending monitor and TTE  Signed, Will Meredith Leeds, MD  11/13/2015 3:28 PM     [facility] [address] [place] [address] [phone] (office) [phone] (fax)

## 2015-11-16 ENCOUNTER — Encounter: Payer: Self-pay | Admitting: Radiation Oncology

## 2015-11-16 DIAGNOSIS — M25562 Pain in left knee: Secondary | ICD-10-CM | POA: Diagnosis not present

## 2015-11-16 DIAGNOSIS — G8929 Other chronic pain: Secondary | ICD-10-CM | POA: Diagnosis not present

## 2015-11-16 NOTE — Progress Notes (Signed)
Kathryn Ortiz is here for a  follow up  new visit for breast cancer upper-inner quadrant of right female breast and follow up on the cause of her drowsiness and syncope spells.  Skin status:Normal color still having some itching at times.   Have you seen your surgeon for follow up? First week of April called for office notes. Have you seen your medical oncologist? Date If not ,when is appointment?Dr. Loleta Dicker Gudena-11-05-15 Arm movement:Able to move right arm without discomfort. Appetite:Good Pain:4/10 taking Hydrocodone Energy level:Having fatigue most of the day. BP 103/78 mmHg  Pulse 68  Temp(Src) 98.1 F (36.7 C) (Oral)  Resp 16  Ht 4\' 11"  (1.499 m)  Wt 147 lb (66.679 kg)  BMI 29.67 kg/m2  SpO2 100%

## 2015-11-17 ENCOUNTER — Ambulatory Visit: Payer: Medicare Other | Admitting: Cardiology

## 2015-11-18 ENCOUNTER — Ambulatory Visit: Payer: Medicare Other

## 2015-11-18 ENCOUNTER — Ambulatory Visit
Admission: RE | Admit: 2015-11-18 | Discharge: 2015-11-18 | Disposition: A | Discharge status: Home / Self Care | Payer: Medicare Other | Source: Ambulatory Visit | Attending: Radiation Oncology | Admitting: Radiation Oncology

## 2015-11-18 ENCOUNTER — Ambulatory Visit (INDEPENDENT_AMBULATORY_CARE_PROVIDER_SITE_OTHER): Payer: Medicare Other

## 2015-11-18 DIAGNOSIS — R55 Syncope and collapse: Secondary | ICD-10-CM

## 2015-11-18 DIAGNOSIS — R002 Palpitations: Secondary | ICD-10-CM | POA: Diagnosis not present

## 2015-11-19 ENCOUNTER — Ambulatory Visit
Admission: RE | Admit: 2015-11-19 | Discharge: 2015-11-19 | Disposition: A | Discharge status: Home / Self Care | Payer: Medicare Other | Source: Ambulatory Visit | Attending: Radiation Oncology | Admitting: Radiation Oncology

## 2015-11-19 ENCOUNTER — Encounter: Payer: Self-pay | Admitting: Radiation Oncology

## 2015-11-19 VITALS — BP 103/78 | HR 68 | Temp 98.1°F | Resp 16 | Ht 59.0 in | Wt 147.0 lb

## 2015-11-19 DIAGNOSIS — C50911 Malignant neoplasm of unspecified site of right female breast: Secondary | ICD-10-CM | POA: Diagnosis not present

## 2015-11-19 DIAGNOSIS — Z170 Estrogen receptor positive status [ER+]: Secondary | ICD-10-CM | POA: Diagnosis not present

## 2015-11-19 DIAGNOSIS — C50211 Malignant neoplasm of upper-inner quadrant of right female breast: Secondary | ICD-10-CM

## 2015-11-19 HISTORY — DX: Occlusion and stenosis of unspecified carotid artery: I65.29

## 2015-11-19 HISTORY — DX: Malignant neoplasm of unspecified site of unspecified female breast: C50.919

## 2015-11-19 HISTORY — DX: Syncope and collapse: R55

## 2015-11-19 NOTE — Progress Notes (Signed)
Department of Radiation Oncology  Phone:  [phone] Fax:        [phone]   Name: Kathryn Ortiz MRN: [id number]  DOB: [birth year]  Date: 11/19/2015  Follow Up Visit Note  DIAGNOSIS AND STAGE: T2 N0 invasive ductal carcinoma of the right breast  Interval History: Kathryn Ortiz is a 75 y.o female with T2 N0 invasive ductal carcinoma of the right breast who presents today for routine followup. I met with the patient last on 10/22/15 and agreed to follow up in 1 month after she investigated the cause of her drowsiness and syncope spells. In the interim she met with Dr.Dohmeier at the sleep medicine clinic. It was determined that her high index of depression and medications were likely contributors to her fatigue and daytime sleepiness. She will be screened for sleep apnea. A brain MRI and EEG were subsequently ordered.    A brain MRI was performed on 11/04/15 which revealed a 19 mm posterior left parafalcine meningioma but otherwise no other areas of concern. She will continue to be followed by electrophysiology (Dr. Curt Bears) for further workup of possible arrhythmic syncope.  On today's visit, the patient was sleeping when entering the room. Her husband states she slept upwards of 10 hrs yesterday. The patient states she takes hydrocodone once a day. She states hydrocodone does not make her tired but energizes her. She also takes trazadone to help her sleep and neurontin at night.     Breast cancer of upper-inner quadrant of right female breast ([place])   07/14/2015 Mammogram Right breast posterior spiculated mass 2:00 position: 1.7 cm, multiple normal-sized right axillary lymph nodes with borderline diffuse cortical thickening, T1 cN0 stage IA   07/28/2015 Initial Diagnosis Right breast biopsy 2:00: Invasive ductal carcinoma with DCIS, positive for perineural invasion, grade 1, ER 100%, PR 90%, HER-2 negative ratio 1.29, Ki-67 10%   08/24/2015 Breast MRI Right breast mass 2.4 x 1.8 x 1.6 cm, the  enhancement does not appear to abut or invade the adjacent pectoralis muscle   09/25/2015 Surgery Rt Lumpectomy: IDC grade 1, 2.1 cm, LG DCIS, LVI and PNI, DCIS focally less than 1 mm, 0/3 LN, T2N0 (Stage 2A)    Physical Exam:  Filed Vitals:   11/19/15 1040  BP: 103/78  Pulse: 68  Temp: 98.1 F (36.7 C)  TempSrc: Oral  Resp: 16  Height: '4\' 11"'$  (1.499 m)  Weight: 147 lb (66.679 kg)  SpO2: 100%   Continues to be somnolent on today's visit.   IMPRESSION: Kathryn Ortiz is a 75 y.o. female diagnosed with T2 N0 invasive ductal carcinoma of the right breast who experiencing significant drowsiness and bouts of syncope.   PLAN: .The patient is still experiencing significant daytime fatigue. We discussed proceeding with just anti-estrogen therapy considering radiation treatments would exacerbate her fatigue. I went over current medications with her and showed her medications that could be contributing to her fatigue. We agreed that she should taper/discontinue medications that cause fatigue. After 1 month we will determine if this improves her fatigue. If this does not improve, we will proceed with anti-estrogen treatment alone.  Follow up in 1 month.   ------------------------------------------------  Thea Silversmith, MD  This document serves as a record of services personally performed by Thea Silversmith, MD. It was created on her behalf by Derek Mound, a trained medical scribe. The creation of this record is based on the scribe's personal observations and the provider's statements to them. This document has been checked and approved by the  attending provider.

## 2015-11-20 NOTE — Addendum Note (Signed)
Encounter addended by: Malena Edman, RN on: 11/20/2015 11:19 AM<BR>     Documentation filed: Charges VN

## 2015-12-02 ENCOUNTER — Other Ambulatory Visit: Payer: Self-pay

## 2015-12-02 ENCOUNTER — Ambulatory Visit (HOSPITAL_COMMUNITY): Payer: Medicare Other | Attending: Internal Medicine

## 2015-12-02 DIAGNOSIS — E785 Hyperlipidemia, unspecified: Secondary | ICD-10-CM | POA: Insufficient documentation

## 2015-12-02 DIAGNOSIS — R0602 Shortness of breath: Secondary | ICD-10-CM | POA: Diagnosis not present

## 2015-12-02 DIAGNOSIS — I071 Rheumatic tricuspid insufficiency: Secondary | ICD-10-CM | POA: Insufficient documentation

## 2015-12-02 DIAGNOSIS — I119 Hypertensive heart disease without heart failure: Secondary | ICD-10-CM | POA: Insufficient documentation

## 2015-12-02 DIAGNOSIS — I340 Nonrheumatic mitral (valve) insufficiency: Secondary | ICD-10-CM | POA: Insufficient documentation

## 2015-12-02 DIAGNOSIS — R55 Syncope and collapse: Secondary | ICD-10-CM | POA: Insufficient documentation

## 2015-12-08 DIAGNOSIS — H18413 Arcus senilis, bilateral: Secondary | ICD-10-CM | POA: Diagnosis not present

## 2015-12-08 DIAGNOSIS — H04123 Dry eye syndrome of bilateral lacrimal glands: Secondary | ICD-10-CM | POA: Diagnosis not present

## 2015-12-08 DIAGNOSIS — H401131 Primary open-angle glaucoma, bilateral, mild stage: Secondary | ICD-10-CM | POA: Diagnosis not present

## 2015-12-08 DIAGNOSIS — H11423 Conjunctival edema, bilateral: Secondary | ICD-10-CM | POA: Diagnosis not present

## 2015-12-08 DIAGNOSIS — H21262 Iris atrophy (essential) (progressive), left eye: Secondary | ICD-10-CM | POA: Diagnosis not present

## 2015-12-11 NOTE — Progress Notes (Signed)
Mrs. Spath is a follow up new for breast cancer upper-inner quadrant of right female breast.  She has being experiencing significant daytime fatigue. She was to taper herself of of her medications that could cause fatigue and come back in one month to determine if her fatigue has improved.  Arrived at 3:26 p.m.   Skin status:Right breast with normal color. Lotion being used: Lotion with vitamin E at times. Have you seen your medical oncologist? Date If not ,when is appointment 01-05-16 ER+,have started AI or Tamoxifen? If not, why? 08-10-15 Anastrozole Discuss survivorship appointment: [facility]  Offer referral reading material for Survivorship, [employer] and [facility] given   Appetite: Fair Pain:None Arm mobility:Able to raise right arm without difficulty. Fatigue:Having fatigue during the day.  Says she is not going to sleep but during the day usually two days out of the week takes a nap during the day.  Still taking Neurontin 300 mg at hs,Klonopin 0.5 mg daily as needed for anxiety, Trazodone at hs for sleep states she does not take Klonopin if she takes the Trazodone and Hydrocodone 7.5-325 mg twice a day for pain.  Feels that she is more alert. BP 157/69 mmHg  Pulse 79  Temp(Src) 98.6 F (37 C) (Oral)  Resp 16  Ht 4\' 11"  (1.499 m)  Wt 148 lb 9.6 oz (67.405 kg)  BMI 30.00 kg/m2  SpO2 98%

## 2015-12-16 ENCOUNTER — Encounter: Payer: Self-pay | Admitting: Radiation Oncology

## 2015-12-16 ENCOUNTER — Ambulatory Visit
Admission: RE | Admit: 2015-12-16 | Discharge: 2015-12-16 | Disposition: A | Discharge status: Home / Self Care | Payer: Medicare Other | Source: Ambulatory Visit | Attending: Radiation Oncology | Admitting: Radiation Oncology

## 2015-12-16 VITALS — BP 157/69 | HR 79 | Temp 98.6°F | Resp 16 | Ht 59.0 in | Wt 148.6 lb

## 2015-12-16 DIAGNOSIS — C50211 Malignant neoplasm of upper-inner quadrant of right female breast: Secondary | ICD-10-CM

## 2015-12-16 DIAGNOSIS — Z170 Estrogen receptor positive status [ER+]: Secondary | ICD-10-CM | POA: Diagnosis not present

## 2015-12-16 DIAGNOSIS — C50911 Malignant neoplasm of unspecified site of right female breast: Secondary | ICD-10-CM | POA: Diagnosis not present

## 2015-12-16 NOTE — Progress Notes (Signed)
Department of Radiation Oncology  Phone:  [phone] Fax:        [phone]   Name: Kathryn Ortiz MRN: [id number]  DOB: [birth year]  Date: 12/16/2015  Follow Up Visit Note  DIAGNOSIS AND STAGE: T2 N0 invasive ductal carcinoma of the right breast  Interval History: Kathryn Ortiz is a 75 y.o female with T2 N0 invasive ductal carcinoma of the right breast who presents today for routine followup. She has been experiencing significant fatigue since I met her initially in March 2017. She reduced some of the medication she was taking and is doing better and not sleeping as much during the day. She mentions that she can go a full day without taking a nap. Her husband thinks that she can drive by herself. She mentions that her appetite is fair. She denies pain. She is able to raise her arm without difficulty. She mentions that she is having fatigue during the day. She said she is not going to sleep but during the day usually two days out of the week she takes a nap during the day. She is still taking Neurontin 300 mg at night, Klonopin 0.5 mg daily as needed for anxiety, Trazodone at night for sleep and states she does not take Klonopin if she takes the Trazodone and Hydrocodone 7.5-325 twice daily for pain. She feels more alert.    Breast cancer of upper-inner quadrant of right female breast ([place])   07/14/2015 Mammogram Right breast posterior spiculated mass 2:00 position: 1.7 cm, multiple normal-sized right axillary lymph nodes with borderline diffuse cortical thickening, T1 cN0 stage IA   07/28/2015 Initial Diagnosis Right breast biopsy 2:00: Invasive ductal carcinoma with DCIS, positive for perineural invasion, grade 1, ER 100%, PR 90%, HER-2 negative ratio 1.29, Ki-67 10%   08/24/2015 Breast MRI Right breast mass 2.4 x 1.8 x 1.6 cm, the enhancement does not appear to abut or invade the adjacent pectoralis muscle   09/25/2015 Surgery Rt Lumpectomy: IDC grade 1, 2.1 cm, LG DCIS, LVI and PNI, DCIS focally  less than 1 mm, 0/3 LN, T2N0 (Stage 2A)    Physical Exam:  Filed Vitals:   12/16/15 1545  BP: 157/69  Pulse: 79  Temp: 98.6 F (37 C)  TempSrc: Oral  Resp: 16  Height: '4\' 11"'$  (1.499 m)  Weight: 148 lb 9.6 oz (67.405 kg)  SpO2: 98%  Alert and oriented.   IMPRESSION: Kathryn Ortiz is a 75 y.o. female diagnosed with T2 N0 invasive ductal carcinoma of the right breast who was experiencing significant drowsiness and bouts of syncope.  PLAN: We discussed continuing anti-estrogen therapy. I don't want to cause her further fatigue as this has been a struggle for her. Given her age, I think it is fine to continue on anti-estrogen therapy alone. She does have a slightly higher recurrence risk given her focally close in situ margins but her tumor was 100% ER positive and she is tolerating her anti-estrogen medication well. I will see her back as needed. She knows she can call me if she has any questions. We discussed the need for annual mammograms.  She has an appointment with Dr. Lindi Adie 01/05/2016.  She will also require imaging follow up of her meningioma which is asymptomatic at this point.   ------------------------------------------------  Thea Silversmith, MD    This document serves as a record of services personally performed by Thea Silversmith, MD. It was created on her behalf by  Lendon Collar, a trained medical scribe. The creation of this record  is based on the scribe's personal observations and the provider's statements to them. This document has been checked and approved by the attending provider.

## 2015-12-17 NOTE — Addendum Note (Signed)
Encounter addended by: Malena Edman, RN on: 12/17/2015  9:12 AM<BR>     Documentation filed: Charges VN

## 2015-12-22 DIAGNOSIS — Z961 Presence of intraocular lens: Secondary | ICD-10-CM | POA: Diagnosis not present

## 2015-12-22 DIAGNOSIS — H401131 Primary open-angle glaucoma, bilateral, mild stage: Secondary | ICD-10-CM | POA: Diagnosis not present

## 2015-12-23 ENCOUNTER — Encounter: Payer: Self-pay | Admitting: *Deleted

## 2015-12-23 NOTE — Progress Notes (Signed)
Mrs. Wilkowski called Thursday,12-17-15 said she forgot because of  excitement when she was told she did not need to have radiation.   She was told to take the anti-estrogen pill .  She  Stated "I forgot to ask if I was cancer free".   Has an appointment with Dr. Lindi Adie 01-05-16.  He will monitor her care in the future.

## 2015-12-24 ENCOUNTER — Encounter: Payer: Self-pay | Admitting: *Deleted

## 2015-12-24 NOTE — Addendum Note (Signed)
Encounter addended by: Malena Edman, RN on: 12/24/2015 11:46 AM<BR>     Documentation filed: Charges VN

## 2015-12-24 NOTE — Progress Notes (Signed)
Tonie - please tell her there is no evidence of cancer in her body right now and we will monitor her closely for her cancer coming back.  She has done everything she can to get rid of her cancer and taking the pills will decrease the likelihood of cancer coming back.  There is a very low chance of cancer coming back after 5 years and that is when we, as cancer physicians, say that patients are "cured."

## 2015-12-24 NOTE — Progress Notes (Signed)
Called Kathryn Ortiz no answer left a message for her to call me back.   Asked her to ask for [facility] nurse for Dr. Pablo Ledger.  I told her that  Dr. Pablo Ledger gave me an answer  and good new for her in reference to her question from a few days ago.

## 2015-12-25 ENCOUNTER — Telehealth: Payer: Self-pay | Admitting: *Deleted

## 2015-12-25 ENCOUNTER — Encounter: Payer: Self-pay | Admitting: *Deleted

## 2015-12-25 NOTE — Telephone Encounter (Signed)
Kathryn Ortiz returned my call and I read the information from Dr. Pablo Ledger to her over the telephone she was very happy an thankful to get the report from  Dr. Pablo Ledger.  She understood what was the information without further questions.

## 2015-12-30 ENCOUNTER — Other Ambulatory Visit: Payer: Self-pay

## 2015-12-30 DIAGNOSIS — C50211 Malignant neoplasm of upper-inner quadrant of right female breast: Secondary | ICD-10-CM

## 2015-12-30 MED ORDER — ANASTROZOLE 1 MG PO TABS: 1.0000 mg | ORAL_TABLET | Freq: Every day | ORAL | Status: DC

## 2015-12-31 DIAGNOSIS — Z23 Encounter for immunization: Secondary | ICD-10-CM | POA: Diagnosis not present

## 2015-12-31 DIAGNOSIS — M25562 Pain in left knee: Secondary | ICD-10-CM | POA: Diagnosis not present

## 2015-12-31 DIAGNOSIS — R739 Hyperglycemia, unspecified: Secondary | ICD-10-CM | POA: Diagnosis not present

## 2015-12-31 DIAGNOSIS — I10 Essential (primary) hypertension: Secondary | ICD-10-CM | POA: Diagnosis not present

## 2016-01-04 ENCOUNTER — Encounter: Payer: Self-pay | Admitting: Neurology

## 2016-01-04 ENCOUNTER — Telehealth: Payer: Self-pay | Admitting: Neurology

## 2016-01-05 ENCOUNTER — Ambulatory Visit: Payer: Medicare Other | Admitting: Hematology and Oncology

## 2016-01-05 NOTE — Assessment & Plan Note (Signed)
Rt Lumpectomy 09/28/15: IDC grade 1, 2.1 cm, LG DCIS, LVI and PNI, DCIS focally less than 1 mm, 0/3 LN, T2N0 (Stage 2A),  Pathology counseling: I discussed the final pathology report of the patient provided a copy of this report. I discussed the margins as well as lymph node surgeries. We also discussed the final staging along with previously performed ER/PR and HER-2/neu testing. Patient does not have any interest in adjuvant chemotherapy and hence I did not see any point in sending for Oncotype DX testing. Previous normocytic anemia related to anemia of chronic kidney disease  Treatment plan: 1. Given her elderly age and comorbidities, adjuvant XRT was not recommended 3. Tamoxifen 20 mg daily started 01/05/2016  Return to clinic in 3 months for toxicity check and follow-up

## 2016-01-07 ENCOUNTER — Encounter: Payer: Self-pay | Admitting: *Deleted

## 2016-01-07 ENCOUNTER — Telehealth: Payer: Self-pay | Admitting: Hematology and Oncology

## 2016-01-07 NOTE — Telephone Encounter (Signed)
spoke w/  pt confirmed 6/19 apt

## 2016-01-08 ENCOUNTER — Encounter: Payer: Self-pay | Admitting: Neurology

## 2016-01-11 ENCOUNTER — Other Ambulatory Visit: Payer: Self-pay | Admitting: *Deleted

## 2016-01-11 ENCOUNTER — Encounter: Payer: Self-pay | Admitting: Hematology and Oncology

## 2016-01-11 ENCOUNTER — Ambulatory Visit (HOSPITAL_BASED_OUTPATIENT_CLINIC_OR_DEPARTMENT_OTHER): Payer: Medicare Other | Admitting: Hematology and Oncology

## 2016-01-11 ENCOUNTER — Telehealth: Payer: Self-pay | Admitting: Hematology and Oncology

## 2016-01-11 VITALS — BP 107/63 | HR 69 | Temp 99.1°F | Resp 18 | Wt 150.3 lb

## 2016-01-11 DIAGNOSIS — N951 Menopausal and female climacteric states: Secondary | ICD-10-CM

## 2016-01-11 DIAGNOSIS — F329 Major depressive disorder, single episode, unspecified: Secondary | ICD-10-CM | POA: Diagnosis not present

## 2016-01-11 DIAGNOSIS — R454 Irritability and anger: Secondary | ICD-10-CM

## 2016-01-11 DIAGNOSIS — F3489 Other specified persistent mood disorders: Secondary | ICD-10-CM | POA: Diagnosis not present

## 2016-01-11 DIAGNOSIS — C50211 Malignant neoplasm of upper-inner quadrant of right female breast: Secondary | ICD-10-CM

## 2016-01-11 MED ORDER — TAMOXIFEN CITRATE 20 MG PO TABS: 20.0000 mg | ORAL_TABLET | Freq: Every day | ORAL | Status: DC

## 2016-01-11 NOTE — Assessment & Plan Note (Addendum)
Rt Lumpectomy 09/28/15: IDC grade 1, 2.1 cm, LG DCIS, LVI and PNI, DCIS focally less than 1 mm, 0/3 LN, T2N0 (Stage 2A) Patient did not undergo radiation given her age  Treatment plan: Antiestrogen therapy with anastrozole 1 mg daily started March 2017 stopped 01/11/2016 Starting tamoxifen 20 mg daily 01/11/2016  Anastrozole toxicities: 1. Profound mood swings going from one extreme of depression and sadness to another extreme of anger and resentment   2. hot flashes  Because of the above side effects, she cannot continue with anastrozole. I recommended discontinuation of anastrozole and starting her on tamoxifen.   I will see her back again in 3 months for follow-up. Instructed her to call me if she develops any side effects. We may consider reducing the dosage to 10 mg daily and if she cannot even tolerate that then we may have to discontinue antiestrogen therapy.   Return to clinic in 3 months with toxicity check and follow-up

## 2016-01-11 NOTE — Telephone Encounter (Signed)
appt made and avs printed °

## 2016-01-11 NOTE — Progress Notes (Signed)
Patient Care Team: Jani Gravel, MD as PCP - General (Internal Medicine)  SUMMARY OF ONCOLOGIC HISTORY:   Breast cancer of upper-inner quadrant of right female breast ([place])   07/14/2015 Mammogram Right breast posterior spiculated mass 2:00 position: 1.7 cm, multiple normal-sized right axillary lymph nodes with borderline diffuse cortical thickening, T1 cN0 stage IA   07/28/2015 Initial Diagnosis Right breast biopsy 2:00: Invasive ductal carcinoma with DCIS, positive for perineural invasion, grade 1, ER 100%, PR 90%, HER-2 negative ratio 1.29, Ki-67 10%   08/24/2015 Breast MRI Right breast mass 2.4 x 1.8 x 1.6 cm, the enhancement does not appear to abut or invade the adjacent pectoralis muscle   09/25/2015 Surgery Rt Lumpectomy: IDC grade 1, 2.1 cm, LG DCIS, LVI and PNI, DCIS focally less than 1 mm, 0/3 LN, T2N0 (Stage 2A)   10/11/2015 -  Anti-estrogen oral therapy Adjuvant antiestrogen therapy with anastrozole 1 mg by mouth daily, could not tolerate it, switched to tamoxifen 20 mg daily 01/10/2069    CHIEF COMPLIANT: Inability to tolerate anastrozole  INTERVAL HISTORY: Kathryn Ortiz is a 75 year old with above-mentioned history of right breast cancer currently on adjuvant antiestrogen therapy with anastrozole. Radiation therapy Dr. Pablo Ledger had this recommended not doing radiation. Unfortunately she could not continue with anastrozole because she is having lots of side effects. She currently has hot flashes as well as severe mood swings to the point that her family is very worried about her emotional state. She could be crying and in tears and one time and then the next minute she could be very angry and upset. She also complains of some myalgias.  REVIEW OF SYSTEMS:   Constitutional: Denies fevers, chills or abnormal weight loss Eyes: Denies blurriness of vision Ears, nose, mouth, throat, and face: Denies mucositis or sore throat Respiratory: Denies cough, dyspnea or wheezes Cardiovascular: Denies  palpitation, chest discomfort Gastrointestinal:  Denies nausea, heartburn or change in bowel habits Skin: Denies abnormal skin rashes Lymphatics: Denies new lymphadenopathy or easy bruising Neurological:Denies numbness, tingling or new weaknesses Behavioral/Psych: Mood swings Extremities: No lower extremity edema Breast:  denies any pain or lumps or nodules in either breasts All other systems were reviewed with the patient and are negative.  I have reviewed the past medical history, past surgical history, social history and family history with the patient and they are unchanged from previous note.  ALLERGIES:  is allergic to penicillins and celexa.  MEDICATIONS:  Current Outpatient Prescriptions  Medication Sig Dispense Refill  . Cholecalciferol (VITAMIN D PO) Take 1 tablet by mouth daily.    . clonazePAM (KLONOPIN) 1 MG tablet Take 0.5 mg by mouth daily as needed for anxiety.    . Cyanocobalamin (VITAMIN B12 PO) Take 1 tablet by mouth daily. Reported on 10/22/2015    . gabapentin (NEURONTIN) 300 MG capsule Take 300 mg by mouth at bedtime.    Marland Kitchen HYDROcodone-acetaminophen (NORCO) 7.5-325 MG tablet Take 1 tablet by mouth 2 (two) times daily.  0  . latanoprost (XALATAN) 0.005 % ophthalmic solution Place 1 drop into both eyes every morning.    . lovastatin (MEVACOR) 10 MG tablet Take 10 mg by mouth 2 (two) times a week. Reported on 12/16/2015    . omeprazole (PRILOSEC) 20 MG capsule Take 20 mg by mouth daily.    . tamoxifen (NOLVADEX) 20 MG tablet Take 1 tablet (20 mg total) by mouth daily. 90 tablet 3  . traZODone (DESYREL) 100 MG tablet Take 250 mg by mouth at bedtime. Reported  on 12/16/2015     No current facility-administered medications for this visit.    PHYSICAL EXAMINATION: ECOG PERFORMANCE STATUS: 1 - Symptomatic but completely ambulatory  Filed Vitals:   01/11/16 1137  BP: 107/63  Pulse: 69  Temp: 99.1 F (37.3 C)  Resp: 18   Filed Weights   01/11/16 1137  Weight: 150 lb  4.8 oz (68.176 kg)    GENERAL:alert, no distress and comfortable SKIN: skin color, texture, turgor are normal, no rashes or significant lesions EYES: normal, Conjunctiva are pink and non-injected, sclera clear OROPHARYNX:no exudate, no erythema and lips, buccal mucosa, and tongue normal  NECK: supple, thyroid normal size, non-tender, without nodularity LYMPH:  no palpable lymphadenopathy in the cervical, axillary or inguinal LUNGS: clear to auscultation and percussion with normal breathing effort HEART: regular rate & rhythm and no murmurs and no lower extremity edema ABDOMEN:abdomen soft, non-tender and normal bowel sounds MUSCULOSKELETAL:no cyanosis of digits and no clubbing  NEURO: alert & oriented x 3 with fluent speech, no focal motor/sensory deficits EXTREMITIES: No lower extremity edema  LABORATORY DATA:  I have reviewed the data as listed   Chemistry      Component Value Date/Time   NA 141 10/26/2015 1340   NA 135 04/15/2015 0525   K 4.4 10/26/2015 1340   CL 100 10/26/2015 1340   CO2 24 10/26/2015 1340   BUN 15 10/26/2015 1340   BUN 8 04/15/2015 0525   CREATININE 1.20* 10/26/2015 1340      Component Value Date/Time   CALCIUM 9.8 10/26/2015 1340   ALKPHOS 73 09/21/2007 2155   AST 26 09/21/2007 2155   ALT 23 09/21/2007 2155   BILITOT 0.5 09/21/2007 2155       Lab Results  Component Value Date   WBC 5.1 10/26/2015   HGB 9.1* 04/17/2015   HCT 39.5 10/26/2015   MCV 88 10/26/2015   PLT 246 10/26/2015   NEUTROABS 4.5 05/30/2008     ASSESSMENT & PLAN:  Breast cancer of upper-inner quadrant of right female breast ([place]) Rt Lumpectomy 09/28/15: IDC grade 1, 2.1 cm, LG DCIS, LVI and PNI, DCIS focally less than 1 mm, 0/3 LN, T2N0 (Stage 2A) Patient did not undergo radiation given her age  Treatment plan: Antiestrogen therapy with anastrozole 1 mg daily started March 2017 stopped 01/11/2016 Starting tamoxifen 20 mg daily 01/11/2016  Anastrozole toxicities: 1.  Profound mood swings going from one extreme of depression and sadness to another extreme of anger and resentment   2. hot flashes  Because of the above side effects, she cannot continue with anastrozole. I recommended discontinuation of anastrozole and starting her on tamoxifen.   I will see her back again in 3 months for follow-up. Instructed her to call me if she develops any side effects. We may consider reducing the dosage to 10 mg daily and if she cannot even tolerate that then we may have to discontinue antiestrogen therapy.   Return to clinic in 3 months with toxicity check and follow-up    No orders of the defined types were placed in this encounter.   The patient has a good understanding of the overall plan. she agrees with it. she will call with any problems that may develop before the next visit here.   Rulon Eisenmenger, MD 01/11/2016

## 2016-01-15 ENCOUNTER — Encounter: Payer: Self-pay | Admitting: Nurse Practitioner

## 2016-01-15 DIAGNOSIS — C50211 Malignant neoplasm of upper-inner quadrant of right female breast: Secondary | ICD-10-CM

## 2016-01-15 NOTE — Progress Notes (Signed)
The Survivorship Care Plan was mailed to Kathryn Ortiz as she reported not being able to come in to the Survivorship Clinic for an in-person visit at this time. A letter was mailed to her outlining the purpose of the content of the care plan, as well as encouraging her to reach out to me with any questions or concerns.  My business card was included in the correspondence to the patient as well.  A copy of the care plan was also routed/faxed/mailed to Kathryn Gravel, MD, the patient's PCP.  I will not be placing any follow-up appointments to the Survivorship Clinic for Kathryn Ortiz, but I am happy to see her at any time in the future for any survivorship concerns that may arise. Thank you for allowing me to participate in her care!  Kenn File, [place] [phone]

## 2016-01-22 ENCOUNTER — Encounter: Payer: Self-pay | Admitting: *Deleted

## 2016-01-25 ENCOUNTER — Ambulatory Visit: Payer: Medicare Other | Admitting: Neurology

## 2016-01-28 DIAGNOSIS — I10 Essential (primary) hypertension: Secondary | ICD-10-CM | POA: Diagnosis not present

## 2016-01-28 DIAGNOSIS — Z131 Encounter for screening for diabetes mellitus: Secondary | ICD-10-CM | POA: Diagnosis not present

## 2016-01-28 DIAGNOSIS — R739 Hyperglycemia, unspecified: Secondary | ICD-10-CM | POA: Diagnosis not present

## 2016-01-28 DIAGNOSIS — E2839 Other primary ovarian failure: Secondary | ICD-10-CM | POA: Diagnosis not present

## 2016-02-03 ENCOUNTER — Other Ambulatory Visit: Payer: Self-pay | Admitting: Internal Medicine

## 2016-02-03 DIAGNOSIS — R609 Edema, unspecified: Secondary | ICD-10-CM

## 2016-02-10 ENCOUNTER — Ambulatory Visit
Admission: RE | Admit: 2016-02-10 | Discharge: 2016-02-10 | Disposition: A | Discharge status: Home / Self Care | Payer: Medicare Other | Source: Ambulatory Visit | Attending: Internal Medicine | Admitting: Internal Medicine

## 2016-02-10 DIAGNOSIS — R609 Edema, unspecified: Secondary | ICD-10-CM

## 2016-02-10 DIAGNOSIS — R600 Localized edema: Secondary | ICD-10-CM | POA: Diagnosis not present

## 2016-02-29 DIAGNOSIS — R739 Hyperglycemia, unspecified: Secondary | ICD-10-CM | POA: Diagnosis not present

## 2016-02-29 DIAGNOSIS — I10 Essential (primary) hypertension: Secondary | ICD-10-CM | POA: Diagnosis not present

## 2016-02-29 DIAGNOSIS — R21 Rash and other nonspecific skin eruption: Secondary | ICD-10-CM | POA: Diagnosis not present

## 2016-03-14 NOTE — Telephone Encounter (Signed)
error 

## 2016-03-16 NOTE — Telephone Encounter (Signed)
Error

## 2016-04-11 ENCOUNTER — Encounter: Payer: Self-pay | Admitting: Hematology and Oncology

## 2016-04-11 ENCOUNTER — Telehealth: Payer: Self-pay | Admitting: Hematology and Oncology

## 2016-04-11 ENCOUNTER — Ambulatory Visit (HOSPITAL_BASED_OUTPATIENT_CLINIC_OR_DEPARTMENT_OTHER): Payer: Medicare Other | Admitting: Hematology and Oncology

## 2016-04-11 ENCOUNTER — Ambulatory Visit: Payer: Medicare Other | Admitting: Hematology and Oncology

## 2016-04-11 DIAGNOSIS — C50211 Malignant neoplasm of upper-inner quadrant of right female breast: Secondary | ICD-10-CM | POA: Diagnosis not present

## 2016-04-11 DIAGNOSIS — Z79810 Long term (current) use of selective estrogen receptor modulators (SERMs): Secondary | ICD-10-CM | POA: Diagnosis not present

## 2016-04-11 NOTE — Progress Notes (Signed)
Patient Care Team: Jani Gravel, MD as PCP - General (Internal Medicine) Nicholas Lose, MD as Consulting Physician (Hematology and Oncology) Autumn Messing III, MD as Consulting Physician (General Surgery) Thea Silversmith, MD as Consulting Physician (Radiation Oncology)  DIAGNOSIS: Breast cancer of upper-inner quadrant of right female breast [facility])   Staging form: Breast, AJCC 7th Edition   - Clinical stage from 08/24/2015: Stage IIA (T2, N0, M0) - Unsigned   - Pathologic stage from 09/25/2015: Stage IIA (T2, N0, cM0) - Unsigned  SUMMARY OF ONCOLOGIC HISTORY:   Breast cancer of upper-inner quadrant of right female breast ([place])   07/14/2015 Mammogram    Right breast posterior spiculated mass 2:00 position: 1.7 cm, multiple normal-sized right axillary lymph nodes with borderline diffuse cortical thickening      07/28/2015 Initial Diagnosis    Right breast biopsy 2:00: Invasive ductal carcinoma with DCIS, positive for perineural invasion, grade 1, ER 100%, PR 90%, HER-2 negative ratio 1.29, Ki-67 10%      08/24/2015 Clinical Stage    Stage IA: T1c N0      08/24/2015 Breast MRI    Right breast mass 2.4 x 1.8 x 1.6 cm, the enhancement does not appear to abut or invade the adjacent pectoralis muscle      09/25/2015 Surgery    Rt Lumpectomy: IDC grade 1, 2.1 cm, LG DCIS, LVI and PNI, DCIS focally less than 1 mm, 0/3 LN      09/25/2015 Pathologic Stage    Stage IIA: T2 N0      10/11/2015 -  Anti-estrogen oral therapy    Adjuvant antiestrogen therapy with anastrozole 1 mg by mouth daily, could not tolerate it, switched to tamoxifen 20 mg daily 01/11/2016       Radiation Therapy    Not recommended due to fatigue      01/15/2016 Survivorship    Survivorship care plan mailed to patient in lieu of in person visit at her request       CHIEF COMPLIANT: Follow-up on tamoxifen  INTERVAL HISTORY: Kathryn Ortiz is a 75 year old with above-mentioned history of right breast cancer currently on adjuvant  tamoxifen. She could not tolerate anastrozole. She reports that tamoxifen has been much better. She denies any hot flashes or myalgias. She feels she can tolerate this and can continue this.  REVIEW OF SYSTEMS:   Constitutional: Denies fevers, chills or abnormal weight loss Eyes: Denies blurriness of vision Ears, nose, mouth, throat, and face: Denies mucositis or sore throat Respiratory: Denies cough, dyspnea or wheezes Cardiovascular: Denies palpitation, chest discomfort Gastrointestinal:  Denies nausea, heartburn or change in bowel habits Skin: Denies abnormal skin rashes Lymphatics: Denies new lymphadenopathy or easy bruising Neurological:Denies numbness, tingling or new weaknesses Behavioral/Psych: Mood is stable, no new changes  Extremities: No lower extremity edema Breast:  denies any pain or lumps or nodules in either breasts All other systems were reviewed with the patient and are negative.  I have reviewed the past medical history, past surgical history, social history and family history with the patient and they are unchanged from previous note.  ALLERGIES:  is allergic to penicillins and celexa [citalopram].  MEDICATIONS:  Current Outpatient Prescriptions  Medication Sig Dispense Refill  . Cholecalciferol (VITAMIN D PO) Take 1 tablet by mouth daily.    . clonazePAM (KLONOPIN) 1 MG tablet Take 0.5 mg by mouth daily as needed for anxiety.    . Cyanocobalamin (VITAMIN B12 PO) Take 1 tablet by mouth daily. Reported on 10/22/2015    .  gabapentin (NEURONTIN) 300 MG capsule Take 300 mg by mouth at bedtime.    Marland Kitchen HYDROcodone-acetaminophen (NORCO) 7.5-325 MG tablet Take 1 tablet by mouth 2 (two) times daily.  0  . latanoprost (XALATAN) 0.005 % ophthalmic solution Place 1 drop into both eyes every morning.    . lovastatin (MEVACOR) 10 MG tablet Take 10 mg by mouth 2 (two) times a week. Reported on 12/16/2015    . omeprazole (PRILOSEC) 20 MG capsule Take 20 mg by mouth daily.    .  tamoxifen (NOLVADEX) 20 MG tablet Take 1 tablet (20 mg total) by mouth daily. 90 tablet 3  . traZODone (DESYREL) 100 MG tablet Take 250 mg by mouth at bedtime. Reported on 12/16/2015     No current facility-administered medications for this visit.     PHYSICAL EXAMINATION: ECOG PERFORMANCE STATUS: 1 - Symptomatic but completely ambulatory  Vitals:   04/11/16 1419  BP: (!) 152/47  Pulse: 94  Resp: 18  Temp: 98.3 F (36.8 C)   Filed Weights   04/11/16 1419  Weight: 152 lb 8 oz (69.2 kg)    GENERAL:alert, no distress and comfortable SKIN: skin color, texture, turgor are normal, no rashes or significant lesions EYES: normal, Conjunctiva are pink and non-injected, sclera clear OROPHARYNX:no exudate, no erythema and lips, buccal mucosa, and tongue normal  NECK: supple, thyroid normal size, non-tender, without nodularity LYMPH:  no palpable lymphadenopathy in the cervical, axillary or inguinal LUNGS: clear to auscultation and percussion with normal breathing effort HEART: regular rate & rhythm and no murmurs and no lower extremity edema ABDOMEN:abdomen soft, non-tender and normal bowel sounds MUSCULOSKELETAL:no cyanosis of digits and no clubbing  NEURO: alert & oriented x 3 with fluent speech, no focal motor/sensory deficits EXTREMITIES: No lower extremity edema  LABORATORY DATA:  I have reviewed the data as listed   Chemistry      Component Value Date/Time   NA 141 10/26/2015 1340   K 4.4 10/26/2015 1340   CL 100 10/26/2015 1340   CO2 24 10/26/2015 1340   BUN 15 10/26/2015 1340   CREATININE 1.20 (H) 10/26/2015 1340      Component Value Date/Time   CALCIUM 9.8 10/26/2015 1340   ALKPHOS 73 09/21/2007 2155   AST 26 09/21/2007 2155   ALT 23 09/21/2007 2155   BILITOT 0.5 09/21/2007 2155       Lab Results  Component Value Date   WBC 5.1 10/26/2015   HGB 9.1 (L) 04/17/2015   HCT 39.5 10/26/2015   MCV 88 10/26/2015   PLT 246 10/26/2015   NEUTROABS 4.5 05/30/2008      ASSESSMENT & PLAN:  Breast cancer of upper-inner quadrant of right female breast ([place]) Rt Lumpectomy 09/28/15: IDC grade 1, 2.1 cm, LG DCIS, LVI and PNI, DCIS focally less than 1 mm, 0/3 LN, T2N0 (Stage 2A) Patient did not undergo radiation given her age  Treatment plan: Antiestrogen therapy with anastrozole 1 mg daily started March 2017 stopped 01/11/2016 Starting tamoxifen 20 mg daily 01/11/2016  Tamoxifen toxicities: Much better tolerated, denies any of the plafond mood swings or hot flashes.  Return to clinic in 6 months for follow-up   No orders of the defined types were placed in this encounter.  The patient has a good understanding of the overall plan. she agrees with it. she will call with any problems that may develop before the next visit here.   Rulon Eisenmenger, MD 04/11/16

## 2016-04-11 NOTE — Assessment & Plan Note (Deleted)
Rt Lumpectomy 09/28/15: IDC grade 1, 2.1 cm, LG DCIS, LVI and PNI, DCIS focally less than 1 mm, 0/3 LN, T2N0 (Stage 2A) Patient did not undergo radiation given her age  Treatment summary :  1. Antiestrogen therapy with anastrozole 1 mg daily started March 2017 stopped 01/11/2016 (profound mood swings depression to anger and resentment in hot flashes) 2. Starting tamoxifen 20 mg daily 01/11/2016  Tamoxifen toxicities:  Return to clinic in 6 months for follow-up

## 2016-04-11 NOTE — Assessment & Plan Note (Signed)
Rt Lumpectomy 09/28/15: IDC grade 1, 2.1 cm, LG DCIS, LVI and PNI, DCIS focally less than 1 mm, 0/3 LN, T2N0 (Stage 2A) Patient did not undergo radiation given her age  Treatment plan: Antiestrogen therapy with anastrozole 1 mg daily started March 2017 stopped 01/11/2016 Starting tamoxifen 20 mg daily 01/11/2016  Tamoxifen toxicities: Much better tolerated, denies any of the plafond mood swings or hot flashes.  Return to clinic in 6 months for follow-up

## 2016-04-11 NOTE — Telephone Encounter (Signed)
04/11/2016 Appointment time rescheduled per patient request.

## 2016-04-11 NOTE — Telephone Encounter (Signed)
04/11/2016 appointment time changed per patient request. Patient opted to have appointment earlier per availability.

## 2016-06-03 ENCOUNTER — Telehealth: Payer: Self-pay | Admitting: *Deleted

## 2016-06-03 NOTE — Telephone Encounter (Signed)
Received Call @ 1005  Pt. wanted to know if she could get an appt. sooner than 10/09/16. Pt said she not having any problems as of right now she just was hoping to see him sooner than that.

## 2016-06-09 DIAGNOSIS — H11423 Conjunctival edema, bilateral: Secondary | ICD-10-CM | POA: Diagnosis not present

## 2016-06-09 DIAGNOSIS — H11003 Unspecified pterygium of eye, bilateral: Secondary | ICD-10-CM | POA: Diagnosis not present

## 2016-06-09 DIAGNOSIS — H11023 Central pterygium of eye, bilateral: Secondary | ICD-10-CM | POA: Diagnosis not present

## 2016-06-09 DIAGNOSIS — R51 Headache: Secondary | ICD-10-CM | POA: Diagnosis not present

## 2016-06-09 DIAGNOSIS — H401131 Primary open-angle glaucoma, bilateral, mild stage: Secondary | ICD-10-CM | POA: Diagnosis not present

## 2016-06-27 DIAGNOSIS — I10 Essential (primary) hypertension: Secondary | ICD-10-CM | POA: Diagnosis not present

## 2016-06-27 DIAGNOSIS — R739 Hyperglycemia, unspecified: Secondary | ICD-10-CM | POA: Diagnosis not present

## 2016-06-30 DIAGNOSIS — R739 Hyperglycemia, unspecified: Secondary | ICD-10-CM | POA: Diagnosis not present

## 2016-06-30 DIAGNOSIS — M859 Disorder of bone density and structure, unspecified: Secondary | ICD-10-CM | POA: Diagnosis not present

## 2016-06-30 DIAGNOSIS — I10 Essential (primary) hypertension: Secondary | ICD-10-CM | POA: Diagnosis not present

## 2016-10-10 ENCOUNTER — Encounter: Payer: Self-pay | Admitting: Hematology and Oncology

## 2016-10-10 ENCOUNTER — Ambulatory Visit (HOSPITAL_BASED_OUTPATIENT_CLINIC_OR_DEPARTMENT_OTHER): Payer: Medicare Other | Admitting: Hematology and Oncology

## 2016-10-10 DIAGNOSIS — Z79810 Long term (current) use of selective estrogen receptor modulators (SERMs): Secondary | ICD-10-CM | POA: Diagnosis not present

## 2016-10-10 DIAGNOSIS — C50211 Malignant neoplasm of upper-inner quadrant of right female breast: Secondary | ICD-10-CM

## 2016-10-10 DIAGNOSIS — Z170 Estrogen receptor positive status [ER+]: Secondary | ICD-10-CM | POA: Diagnosis not present

## 2016-10-10 NOTE — Progress Notes (Signed)
Patient Care Team: Jani Gravel, MD as PCP - General (Internal Medicine) Nicholas Lose, MD as Consulting Physician (Hematology and Oncology) Autumn Messing III, MD as Consulting Physician (General Surgery) Thea Silversmith, MD (Inactive) as Consulting Physician (Radiation Oncology)  DIAGNOSIS:  Encounter Diagnosis  Name Primary?  . Malignant neoplasm of upper-inner quadrant of right breast in female, estrogen receptor positive ([place])     SUMMARY OF ONCOLOGIC HISTORY:   Breast cancer of upper-inner quadrant of right female breast ([place])   07/14/2015 Mammogram    Right breast posterior spiculated mass 2:00 position: 1.7 cm, multiple normal-sized right axillary lymph nodes with borderline diffuse cortical thickening      07/28/2015 Initial Diagnosis    Right breast biopsy 2:00: Invasive ductal carcinoma with DCIS, positive for perineural invasion, grade 1, ER 100%, PR 90%, HER-2 negative ratio 1.29, Ki-67 10%      08/24/2015 Clinical Stage    Stage IA: T1c N0      08/24/2015 Breast MRI    Right breast mass 2.4 x 1.8 x 1.6 cm, the enhancement does not appear to abut or invade the adjacent pectoralis muscle      09/25/2015 Surgery    Rt Lumpectomy: IDC grade 1, 2.1 cm, LG DCIS, LVI and PNI, DCIS focally less than 1 mm, 0/3 LN      09/25/2015 Pathologic Stage    Stage IIA: T2 N0      10/11/2015 -  Anti-estrogen oral therapy    Adjuvant antiestrogen therapy with anastrozole 1 mg by mouth daily, could not tolerate it, switched to tamoxifen 20 mg daily 01/11/2016       Radiation Therapy    Not recommended due to fatigue      01/15/2016 Survivorship    Survivorship care plan mailed to patient in lieu of in person visit at her request       CHIEF COMPLIANT: Follow-up on tamoxifen therapy.  INTERVAL HISTORY: Kathryn Ortiz is a 76 year old with above-mentioned history of right breast cancer currently on antiestrogen therapy with tamoxifen. She is tolerating tamoxifen much better than  anastrozole. She continues to have episodes of depression. She is also concerned about weight gain issues but she has not been exercising and has not been watching her diet. Her husband got diagnosed with metastatic lung cancer and is seeing Dr. Julien Nordmann and probably will start immunotherapy soon.  REVIEW OF SYSTEMS:   Constitutional: Denies fevers, chills or abnormal weight loss Eyes: Denies blurriness of vision Ears, nose, mouth, throat, and face: Denies mucositis or sore throat Respiratory: Denies cough, dyspnea or wheezes Cardiovascular: Denies palpitation, chest discomfort Gastrointestinal:  Denies nausea, heartburn or change in bowel habits Skin: Denies abnormal skin rashes Lymphatics: Denies new lymphadenopathy or easy bruising Neurological:Denies numbness, tingling or new weaknesses Behavioral/Psych: Mood is stable, no new changes  Extremities: No lower extremity edema  All other systems were reviewed with the patient and are negative.  I have reviewed the past medical history, past surgical history, social history and family history with the patient and they are unchanged from previous note.  ALLERGIES:  is allergic to penicillins and celexa [citalopram].  MEDICATIONS:  Current Outpatient Prescriptions  Medication Sig Dispense Refill  . Cholecalciferol (VITAMIN D PO) Take 1 tablet by mouth daily.    . clonazePAM (KLONOPIN) 1 MG tablet Take 0.5 mg by mouth daily as needed for anxiety.    . Cyanocobalamin (VITAMIN B12 PO) Take 1 tablet by mouth daily. Reported on 10/22/2015    . gabapentin (  NEURONTIN) 300 MG capsule Take 300 mg by mouth at bedtime.    Marland Kitchen HYDROcodone-acetaminophen (NORCO) 7.5-325 MG tablet Take 1 tablet by mouth 2 (two) times daily.  0  . latanoprost (XALATAN) 0.005 % ophthalmic solution Place 1 drop into both eyes every morning.    . lovastatin (MEVACOR) 10 MG tablet Take 10 mg by mouth 2 (two) times a week. Reported on 12/16/2015    . omeprazole (PRILOSEC) 20 MG  capsule Take 20 mg by mouth daily.    . tamoxifen (NOLVADEX) 20 MG tablet Take 1 tablet (20 mg total) by mouth daily. 90 tablet 3  . traZODone (DESYREL) 100 MG tablet Take 250 mg by mouth at bedtime. Reported on 12/16/2015     No current facility-administered medications for this visit.     PHYSICAL EXAMINATION: ECOG PERFORMANCE STATUS: 1 - Symptomatic but completely ambulatory  Vitals:   10/10/16 1529  BP: (!) 145/46  Pulse: 68  Resp: 18  Temp: 98.4 F (36.9 C)   Filed Weights   10/10/16 1529  Weight: 147 lb 9.6 oz (67 kg)    GENERAL:alert, no distress and comfortable SKIN: skin color, texture, turgor are normal, no rashes or significant lesions EYES: normal, Conjunctiva are pink and non-injected, sclera clear OROPHARYNX:no exudate, no erythema and lips, buccal mucosa, and tongue normal  NECK: supple, thyroid normal size, non-tender, without nodularity LYMPH:  no palpable lymphadenopathy in the cervical, axillary or inguinal LUNGS: clear to auscultation and percussion with normal breathing effort HEART: regular rate & rhythm and no murmurs and no lower extremity edema ABDOMEN:abdomen soft, non-tender and normal bowel sounds MUSCULOSKELETAL:no cyanosis of digits and no clubbing  NEURO: alert & oriented x 3 with fluent speech, no focal motor/sensory deficits EXTREMITIES: No lower extremity edema BREAST: No palpable masses or nodules in either right or left breasts. No palpable axillary supraclavicular or infraclavicular adenopathy no breast tenderness or nipple discharge. (exam performed in the presence of a chaperone)  LABORATORY DATA:  I have reviewed the data as listed   Chemistry      Component Value Date/Time   NA 141 10/26/2015 1340   K 4.4 10/26/2015 1340   CL 100 10/26/2015 1340   CO2 24 10/26/2015 1340   BUN 15 10/26/2015 1340   CREATININE 1.20 (H) 10/26/2015 1340      Component Value Date/Time   CALCIUM 9.8 10/26/2015 1340   ALKPHOS 73 09/21/2007 2155   AST  26 09/21/2007 2155   ALT 23 09/21/2007 2155   BILITOT 0.5 09/21/2007 2155       Lab Results  Component Value Date   WBC 5.1 10/26/2015   HGB 9.1 (L) 04/17/2015   HCT 39.5 10/26/2015   MCV 88 10/26/2015   PLT 246 10/26/2015   NEUTROABS 4.5 05/30/2008    ASSESSMENT & PLAN:  Breast cancer of upper-inner quadrant of right female breast ([place]) Rt Lumpectomy 09/28/15: IDC grade 1, 2.1 cm, LG DCIS, LVI and PNI, DCIS focally less than 1 mm, 0/3 LN, T2N0 (Stage 2A) Patient did not undergo radiation given her age  Treatment plan: Antiestrogen therapy with anastrozole 1 mg daily started March 2017 stopped 01/11/2016 Starting tamoxifen 20 mg daily 01/11/2016  Tamoxifen toxicities: Much better tolerated, denies any of the profound mood swings or hot flashes. Patient's husband has been diagnosed with stage IV poorly differentiated lung cancer and is meeting with Dr. Julien Nordmann to discuss starting Pembrolizumab immunotherapy treatment. She is very anxious and worried about his health.  Return to clinic  in 1 year for follow-up   I spent 25 minutes talking to the patient of which more than half was spent in counseling and coordination of care.  No orders of the defined types were placed in this encounter.  The patient has a good understanding of the overall plan. she agrees with it. she will call with any problems that may develop before the next visit here.   Rulon Eisenmenger, MD 10/10/16

## 2016-10-10 NOTE — Assessment & Plan Note (Signed)
Rt Lumpectomy 09/28/15: IDC grade 1, 2.1 cm, LG DCIS, LVI and PNI, DCIS focally less than 1 mm, 0/3 LN, T2N0 (Stage 2A) Patient did not undergo radiation given her age  Treatment plan: Antiestrogen therapy with anastrozole 1 mg daily started March 2017 stopped 01/11/2016 Starting tamoxifen 20 mg daily 01/11/2016  Tamoxifen toxicities: Much better tolerated, denies any of the profound mood swings or hot flashes.  Return to clinic in 1 year for follow-up

## 2016-10-20 ENCOUNTER — Inpatient Hospital Stay (HOSPITAL_COMMUNITY)
Admission: EM | Admit: 2016-10-20 | Discharge: 2016-10-24 | DRG: 194 | Disposition: A | Discharge status: Home / Self Care | Payer: Medicare Other | Attending: Internal Medicine | Admitting: Internal Medicine

## 2016-10-20 ENCOUNTER — Encounter (HOSPITAL_COMMUNITY): Payer: Self-pay | Admitting: Family Medicine

## 2016-10-20 ENCOUNTER — Emergency Department (HOSPITAL_COMMUNITY): Payer: Medicare Other

## 2016-10-20 ENCOUNTER — Ambulatory Visit (HOSPITAL_COMMUNITY)
Admission: EM | Admit: 2016-10-20 | Discharge: 2016-10-20 | Disposition: A | Discharge status: Home / Self Care | Payer: Medicare Other

## 2016-10-20 DIAGNOSIS — H353190 Nonexudative age-related macular degeneration, unspecified eye, stage unspecified: Secondary | ICD-10-CM | POA: Diagnosis not present

## 2016-10-20 DIAGNOSIS — Z888 Allergy status to other drugs, medicaments and biological substances status: Secondary | ICD-10-CM

## 2016-10-20 DIAGNOSIS — Z880 Allergy status to penicillin: Secondary | ICD-10-CM | POA: Diagnosis not present

## 2016-10-20 DIAGNOSIS — Z79899 Other long term (current) drug therapy: Secondary | ICD-10-CM

## 2016-10-20 DIAGNOSIS — M1990 Unspecified osteoarthritis, unspecified site: Secondary | ICD-10-CM | POA: Diagnosis present

## 2016-10-20 DIAGNOSIS — J209 Acute bronchitis, unspecified: Secondary | ICD-10-CM | POA: Diagnosis not present

## 2016-10-20 DIAGNOSIS — G4700 Insomnia, unspecified: Secondary | ICD-10-CM | POA: Diagnosis not present

## 2016-10-20 DIAGNOSIS — K219 Gastro-esophageal reflux disease without esophagitis: Secondary | ICD-10-CM | POA: Diagnosis present

## 2016-10-20 DIAGNOSIS — Z853 Personal history of malignant neoplasm of breast: Secondary | ICD-10-CM | POA: Diagnosis not present

## 2016-10-20 DIAGNOSIS — N183 Chronic kidney disease, stage 3 unspecified: Secondary | ICD-10-CM | POA: Diagnosis present

## 2016-10-20 DIAGNOSIS — Z170 Estrogen receptor positive status [ER+]: Secondary | ICD-10-CM

## 2016-10-20 DIAGNOSIS — F419 Anxiety disorder, unspecified: Secondary | ICD-10-CM | POA: Diagnosis not present

## 2016-10-20 DIAGNOSIS — D72819 Decreased white blood cell count, unspecified: Secondary | ICD-10-CM | POA: Diagnosis not present

## 2016-10-20 DIAGNOSIS — D61818 Other pancytopenia: Secondary | ICD-10-CM | POA: Diagnosis present

## 2016-10-20 DIAGNOSIS — D696 Thrombocytopenia, unspecified: Secondary | ICD-10-CM

## 2016-10-20 DIAGNOSIS — R55 Syncope and collapse: Secondary | ICD-10-CM

## 2016-10-20 DIAGNOSIS — C50211 Malignant neoplasm of upper-inner quadrant of right female breast: Secondary | ICD-10-CM

## 2016-10-20 DIAGNOSIS — J101 Influenza due to other identified influenza virus with other respiratory manifestations: Secondary | ICD-10-CM | POA: Diagnosis not present

## 2016-10-20 DIAGNOSIS — Z96651 Presence of right artificial knee joint: Secondary | ICD-10-CM | POA: Diagnosis present

## 2016-10-20 DIAGNOSIS — J111 Influenza due to unidentified influenza virus with other respiratory manifestations: Secondary | ICD-10-CM | POA: Diagnosis present

## 2016-10-20 DIAGNOSIS — I10 Essential (primary) hypertension: Secondary | ICD-10-CM | POA: Diagnosis not present

## 2016-10-20 DIAGNOSIS — E785 Hyperlipidemia, unspecified: Secondary | ICD-10-CM | POA: Diagnosis not present

## 2016-10-20 DIAGNOSIS — I129 Hypertensive chronic kidney disease with stage 1 through stage 4 chronic kidney disease, or unspecified chronic kidney disease: Secondary | ICD-10-CM | POA: Diagnosis present

## 2016-10-20 DIAGNOSIS — F17290 Nicotine dependence, other tobacco product, uncomplicated: Secondary | ICD-10-CM | POA: Diagnosis present

## 2016-10-20 DIAGNOSIS — R531 Weakness: Secondary | ICD-10-CM | POA: Diagnosis not present

## 2016-10-20 DIAGNOSIS — R69 Illness, unspecified: Secondary | ICD-10-CM

## 2016-10-20 DIAGNOSIS — J069 Acute upper respiratory infection, unspecified: Secondary | ICD-10-CM | POA: Diagnosis not present

## 2016-10-20 DIAGNOSIS — N179 Acute kidney failure, unspecified: Secondary | ICD-10-CM

## 2016-10-20 DIAGNOSIS — C9590 Leukemia, unspecified not having achieved remission: Secondary | ICD-10-CM | POA: Diagnosis not present

## 2016-10-20 DIAGNOSIS — R05 Cough: Secondary | ICD-10-CM | POA: Diagnosis not present

## 2016-10-20 DIAGNOSIS — F32A Depression, unspecified: Secondary | ICD-10-CM | POA: Diagnosis present

## 2016-10-20 DIAGNOSIS — Z8249 Family history of ischemic heart disease and other diseases of the circulatory system: Secondary | ICD-10-CM

## 2016-10-20 DIAGNOSIS — F329 Major depressive disorder, single episode, unspecified: Secondary | ICD-10-CM | POA: Diagnosis not present

## 2016-10-20 LAB — CBC WITH DIFFERENTIAL/PLATELET
BASOS PCT: 1 %
Basophils Absolute: 0 10*3/uL (ref 0.0–0.1)
Eosinophils Absolute: 0 10*3/uL (ref 0.0–0.7)
Eosinophils Relative: 0 %
HEMATOCRIT: 35.9 % — AB (ref 36.0–46.0)
Hemoglobin: 12.1 g/dL (ref 12.0–15.0)
LYMPHS ABS: 0.6 10*3/uL — AB (ref 0.7–4.0)
Lymphocytes Relative: 20 %
MCH: 29.6 pg (ref 26.0–34.0)
MCHC: 33.7 g/dL (ref 30.0–36.0)
MCV: 87.8 fL (ref 78.0–100.0)
MONOS PCT: 13 %
Monocytes Absolute: 0.4 10*3/uL (ref 0.1–1.0)
Neutro Abs: 1.8 10*3/uL (ref 1.7–7.7)
Neutrophils Relative %: 66 %
Platelets: 65 10*3/uL — ABNORMAL LOW (ref 150–400)
RBC: 4.09 MIL/uL (ref 3.87–5.11)
RDW: 13.4 % (ref 11.5–15.5)
WBC: 2.8 10*3/uL — ABNORMAL LOW (ref 4.0–10.5)

## 2016-10-20 LAB — URINALYSIS, ROUTINE W REFLEX MICROSCOPIC
Bilirubin Urine: NEGATIVE
GLUCOSE, UA: NEGATIVE mg/dL
KETONES UR: NEGATIVE mg/dL
LEUKOCYTES UA: NEGATIVE
Nitrite: NEGATIVE
PROTEIN: 30 mg/dL — AB
Specific Gravity, Urine: 1.018 (ref 1.005–1.030)
pH: 5 (ref 5.0–8.0)

## 2016-10-20 LAB — INFLUENZA PANEL BY PCR (TYPE A & B)
INFLBPCR: POSITIVE — AB
Influenza A By PCR: NEGATIVE

## 2016-10-20 LAB — BASIC METABOLIC PANEL
ANION GAP: 11 (ref 5–15)
BUN: 17 mg/dL (ref 6–20)
CHLORIDE: 103 mmol/L (ref 101–111)
CO2: 23 mmol/L (ref 22–32)
Calcium: 8.4 mg/dL — ABNORMAL LOW (ref 8.9–10.3)
Creatinine, Ser: 1.88 mg/dL — ABNORMAL HIGH (ref 0.44–1.00)
GFR calc non Af Amer: 25 mL/min — ABNORMAL LOW (ref >60)
GFR, EST AFRICAN AMERICAN: 29 mL/min — AB (ref >60)
Glucose, Bld: 126 mg/dL — ABNORMAL HIGH (ref 65–99)
Potassium: 4.3 mmol/L (ref 3.5–5.1)
SODIUM: 137 mmol/L (ref 135–145)

## 2016-10-20 LAB — I-STAT CG4 LACTIC ACID, ED
Lactic Acid, Venous: 1.42 mmol/L (ref 0.5–1.9)
Lactic Acid, Venous: 0.7 mmol/L (ref 0.5–1.9)

## 2016-10-20 MED ORDER — TRAZODONE HCL 50 MG PO TABS
250.0000 mg | ORAL_TABLET | Freq: Every day | ORAL | Status: DC
  Administered 2016-10-20 – 2016-10-23 (×4): 250 mg via ORAL
  Filled 2016-10-20 (×4): qty 1

## 2016-10-20 MED ORDER — TAMOXIFEN CITRATE 10 MG PO TABS
20.0000 mg | ORAL_TABLET | Freq: Every day | ORAL | Status: DC
Start: 2016-10-21 — End: 2016-10-24
  Administered 2016-10-21 – 2016-10-24 (×4): 20 mg via ORAL
  Filled 2016-10-20 (×4): qty 2

## 2016-10-20 MED ORDER — CLONAZEPAM 0.5 MG PO TABS
0.5000 mg | ORAL_TABLET | Freq: Every day | ORAL | Status: DC | PRN
  Administered 2016-10-22: 0.5 mg via ORAL
  Filled 2016-10-20: qty 1

## 2016-10-20 MED ORDER — HYDROCODONE-ACETAMINOPHEN 7.5-325 MG PO TABS
1.0000 | ORAL_TABLET | Freq: Four times a day (QID) | ORAL | Status: DC | PRN
  Administered 2016-10-21 – 2016-10-22 (×2): 1 via ORAL
  Filled 2016-10-20 (×2): qty 1

## 2016-10-20 MED ORDER — GABAPENTIN 300 MG PO CAPS
300.0000 mg | ORAL_CAPSULE | Freq: Every day | ORAL | Status: DC
  Administered 2016-10-20 – 2016-10-23 (×4): 300 mg via ORAL
  Filled 2016-10-20 (×4): qty 1

## 2016-10-20 MED ORDER — ONDANSETRON HCL 4 MG/2ML IJ SOLN: 4.0000 mg | Freq: Four times a day (QID) | INTRAMUSCULAR | Status: DC | PRN

## 2016-10-20 MED ORDER — ACETAMINOPHEN 650 MG RE SUPP: 650.0000 mg | Freq: Four times a day (QID) | RECTAL | Status: DC | PRN

## 2016-10-20 MED ORDER — HEPARIN SODIUM (PORCINE) 5000 UNIT/ML IJ SOLN: 5000.0000 [IU] | Freq: Three times a day (TID) | INTRAMUSCULAR | Status: DC

## 2016-10-20 MED ORDER — LATANOPROST 0.005 % OP SOLN
1.0000 [drp] | Freq: Every morning | OPHTHALMIC | Status: DC
Start: 2016-10-21 — End: 2016-10-24
  Administered 2016-10-21 – 2016-10-23 (×3): 1 [drp] via OPHTHALMIC
  Filled 2016-10-20: qty 2.5

## 2016-10-20 MED ORDER — ONDANSETRON HCL 4 MG PO TABS: 4.0000 mg | ORAL_TABLET | Freq: Four times a day (QID) | ORAL | Status: DC | PRN

## 2016-10-20 MED ORDER — SODIUM CHLORIDE 0.9 % IV BOLUS (SEPSIS)
500.0000 mL | Freq: Once | INTRAVENOUS | Status: AC
  Administered 2016-10-20: 500 mL via INTRAVENOUS

## 2016-10-20 MED ORDER — ACETAMINOPHEN 325 MG PO TABS
650.0000 mg | ORAL_TABLET | Freq: Once | ORAL | Status: AC
  Administered 2016-10-20: 650 mg via ORAL
  Filled 2016-10-20: qty 2

## 2016-10-20 MED ORDER — SODIUM CHLORIDE 0.9 % IV SOLN
INTRAVENOUS | Status: AC
  Administered 2016-10-20: 23:00:00 via INTRAVENOUS

## 2016-10-20 MED ORDER — ACETAMINOPHEN 325 MG PO TABS
650.0000 mg | ORAL_TABLET | Freq: Four times a day (QID) | ORAL | Status: DC | PRN
  Administered 2016-10-21 (×2): 650 mg via ORAL
  Filled 2016-10-20 (×2): qty 2

## 2016-10-20 MED ORDER — PANTOPRAZOLE SODIUM 40 MG PO TBEC
40.0000 mg | DELAYED_RELEASE_TABLET | Freq: Every day | ORAL | Status: DC
  Administered 2016-10-20 – 2016-10-24 (×5): 40 mg via ORAL
  Filled 2016-10-20 (×5): qty 1

## 2016-10-20 MED ORDER — SODIUM CHLORIDE 0.9% FLUSH
3.0000 mL | Freq: Two times a day (BID) | INTRAVENOUS | Status: DC
  Administered 2016-10-20 – 2016-10-24 (×8): 3 mL via INTRAVENOUS

## 2016-10-20 MED ORDER — VITAMIN D 1000 UNITS PO TABS
1000.0000 [IU] | ORAL_TABLET | Freq: Every day | ORAL | Status: DC
  Administered 2016-10-21 – 2016-10-24 (×4): 1000 [IU] via ORAL
  Filled 2016-10-20 (×5): qty 1

## 2016-10-20 NOTE — H&P (Signed)
History and Physical    Kathryn Ortiz:[id number] DOB: 1940/12/18 DOA: 10/20/2016  PCP: Jani Gravel, MD   Patient coming from: Home  Chief Complaint: Lethargy, gen weakness, syncope, cough, subj fever   HPI: Kathryn Ortiz is a 76 y.o. female with medical history significant for cancer of the right breast status post lumpectomy, depression with anxiety, chronic kidney disease stage III, and GERD who presents the emergency department with progressive generalized weakness, cough, subjective fevers, lightheadedness, and a syncopal episode at home last night. Patient reports that her current illness began approximately 1 week ago with general malaise, sore throat, rhinorrhea, and cough. Over the ensuing days, she has developed subjective fevers and generalized weakness. She becomes lightheaded while up and active about the house and reports a transient loss of consciousness last night. She had been up and walking through the house when she became acutely lightheaded, felt dizzy though she needed to sit down, but was unable to before she lost consciousness and slipped to the floor. She denies any head strike or significant injury resulting from this and regained awareness quickly. She denies chest pain or palpitations and denies lower extremity swelling or tenderness. She reports her cough is only occasionally productive of scant white sputum. She has not attempted any interventions for her symptoms prior to coming in.   ED Course: Upon arrival to the ED, patient is found to be afebrile, saturating adequately on room air, soft and pressure, but vitals otherwise stable. EKG features a sinus rhythm and chest x-ray is negative for acute cardiopulmonary disease. Chemistry panels notable for a serum creatinine 1.88, up from 1.2 a year ago. CBC is notable for a new leukopenia with WBC 2800 and a new thrombocytopenia with platelets 65,000. Urinalysis is unremarkable and lactic acid is reassuring at 1.42. She was  treated with acetaminophen in the emergency department, flu swab was sent for PCR and remains pending, and the patient was given two 500 mL normal saline boluses. She remained hemodynamically stable in the ED and not in acute respiratory distress. She will be observed on telemetry unit for ongoing evaluation and management of influenza-like illness with general weakness and a syncopal episode, as well as apparent AKI and new leukopenia and thrombocytopenia.  Review of Systems:  All other systems reviewed and apart from HPI, are negative.  Past Medical History:  Diagnosis Date  . Anxiety    takes Clonazepam daily as needed  . Arthritis   . Back pain    from knee pain  . Breast cancer ([place])    Right breast  . Depression    takes Celexa daily  . GERD (gastroesophageal reflux disease)    takes Omeprazole daily  . Hyperlipidemia    takes Lovastatin 2 times a week  . Hypertension    was on meds but has been off x 2 yr  . Insomnia    takes Trazodone nightly  . Joint pain   . Joint swelling   . Macular degeneration    dry   . Osteoarthritis of right knee, primary localized 04/14/2015  . S/P total knee arthroplasty    R  . Sciatica   . Stenosis of carotid artery 10-27-15   R & L  . Syncope 11-13-15  . Urinary frequency   . Weakness    tingling in hands and feet    Past Surgical History:  Procedure Laterality Date  . BREAST LUMPECTOMY WITH RADIOACTIVE SEED AND SENTINEL LYMPH NODE BIOPSY Right 09/28/2015  Procedure: BREAST LUMPECTOMY WITH RADIOACTIVE SEED AND SENTINEL LYMPH NODE BIOPSY;  Surgeon: Autumn Messing III, MD;  Location: [facility];  Service: General;  Laterality: Right;  . BREAST SURGERY     breast biopsy  . cataract surgery Bilateral   . COLONOSCOPY    . epidural injections    . SHOULDER SURGERY Bilateral   . TOTAL KNEE ARTHROPLASTY Right 01/12/2015  . TOTAL KNEE ARTHROPLASTY Right 04/14/2015   Procedure: TOTAL KNEE ARTHROPLASTY;  Surgeon: Marchia Bond, MD;   Location: [facility];  Service: Orthopedics;  Laterality: Right;     reports that she has never smoked. Her smokeless tobacco use includes Chew and Snuff. She reports that she does not drink alcohol or use drugs.  Allergies  Allergen Reactions  . Penicillins Hives, Itching and Other (See Comments)    Welps, burning and makes patient sick. Pt. States she had problems breathing. Arms swelling. Tolerated Ancef pre-op 04/14/15  . Celexa [Citalopram] Other (See Comments)    Felt bad    Family History  Problem Relation Age of Onset  . Breast cancer Sister     breast  . Suicidality Father   . Heart failure Mother   . Hypertension Other   . Drug abuse Daughter   . Heart Problems Brother 35    CABG  . Osteoarthritis Sister     3 shoulder surgeries, had staph infection once  . Heart Problems Brother   . Seizures Neg Hx      Prior to Admission medications   Medication Sig Start Date End Date Taking? Authorizing Provider  Cholecalciferol (VITAMIN D PO) Take 1 tablet by mouth daily.   Yes Historical Provider, MD  clonazePAM (KLONOPIN) 1 MG tablet Take 0.5 mg by mouth daily as needed for anxiety.   Yes Historical Provider, MD  Cyanocobalamin (VITAMIN B12 PO) Take 1 tablet by mouth daily. Reported on 10/22/2015   Yes Historical Provider, MD  gabapentin (NEURONTIN) 300 MG capsule Take 300 mg by mouth at bedtime.   Yes Historical Provider, MD  HYDROcodone-acetaminophen (NORCO) 7.5-325 MG tablet Take 1 tablet by mouth 2 (two) times daily. 09/25/15  Yes Historical Provider, MD  latanoprost (XALATAN) 0.005 % ophthalmic solution Place 1 drop into both eyes every morning.   Yes Historical Provider, MD  lovastatin (MEVACOR) 10 MG tablet Take 10 mg by mouth 2 (two) times a week. Reported on 12/16/2015   Yes Historical Provider, MD  omeprazole (PRILOSEC) 20 MG capsule Take 20 mg by mouth daily.   Yes Historical Provider, MD  Phenylephrine-Pheniramine-DM ([place]) 05-14-19 MG PACK Take 1 Package  by mouth daily as needed (COLD).   Yes Historical Provider, MD  tamoxifen (NOLVADEX) 20 MG tablet Take 1 tablet (20 mg total) by mouth daily. Patient taking differently: Take 20 mg by mouth 3 (three) times a week.  01/11/16  Yes Nicholas Lose, MD  traZODone (DESYREL) 100 MG tablet Take 250 mg by mouth at bedtime. Reported on 12/16/2015   Yes Historical Provider, MD    Physical Exam: Vitals:   10/20/16 1900 10/20/16 1930 10/20/16 1945 10/20/16 2015  BP: 105/75 101/77 (!) 112/50 (!) 109/58  Pulse: 63 64 66 (!) 59  Resp: 15 14 15 18   Temp:      TempSrc:      SpO2: 99% 97% 97% 95%  Weight:          Constitutional: NAD, calm, comfortable Eyes: PERTLA, lids and conjunctivae normal ENMT: Mucous membranes are moist. Posterior  pharynx clear of any exudate or lesions.   Neck: normal, supple, no masses, no thyromegaly Respiratory: clear to auscultation bilaterally, no wheezing, no crackles. Normal respiratory effort.   Cardiovascular: S1 & S2 heard, regular rate and rhythm. No significant JVD. Abdomen: No distension, no tenderness, no masses palpated. Bowel sounds normal.  Musculoskeletal: no clubbing / cyanosis. No joint deformity upper and lower extremities. Normal muscle tone.  Skin: no significant rashes, lesions, ulcers. Warm, dry, well-perfused. Neurologic: CN 2-12 grossly intact. Sensation intact, DTR normal. Strength 5/5 in all 4 limbs.  Psychiatric: Alert and oriented x 3. Normal mood and affect.     Labs on Admission: I have personally reviewed following labs and imaging studies  CBC:  Recent Labs Lab 10/20/16 1647  WBC 2.8*  NEUTROABS 1.8  HGB 12.1  HCT 35.9*  MCV 87.8  PLT 65*   Basic Metabolic Panel:  Recent Labs Lab 10/20/16 1553  NA 137  K 4.3  CL 103  CO2 23  GLUCOSE 126*  BUN 17  CREATININE 1.88*  CALCIUM 8.4*   GFR: CrCl cannot be calculated (Unknown ideal weight.). Liver Function Tests: No results for input(s): AST, ALT, ALKPHOS, BILITOT, PROT,  ALBUMIN in the last 168 hours. No results for input(s): LIPASE, AMYLASE in the last 168 hours. No results for input(s): AMMONIA in the last 168 hours. Coagulation Profile: No results for input(s): INR, PROTIME in the last 168 hours. Cardiac Enzymes: No results for input(s): CKTOTAL, CKMB, CKMBINDEX, TROPONINI in the last 168 hours. BNP (last 3 results) No results for input(s): PROBNP in the last 8760 hours. HbA1C: No results for input(s): HGBA1C in the last 72 hours. CBG: No results for input(s): GLUCAP in the last 168 hours. Lipid Profile: No results for input(s): CHOL, HDL, LDLCALC, TRIG, CHOLHDL, LDLDIRECT in the last 72 hours. Thyroid Function Tests: No results for input(s): TSH, T4TOTAL, FREET4, T3FREE, THYROIDAB in the last 72 hours. Anemia Panel: No results for input(s): VITAMINB12, FOLATE, FERRITIN, TIBC, IRON, RETICCTPCT in the last 72 hours. Urine analysis:    Component Value Date/Time   COLORURINE YELLOW 10/20/2016 1915   APPEARANCEUR HAZY (A) 10/20/2016 1915   LABSPEC 1.018 10/20/2016 1915   PHURINE 5.0 10/20/2016 1915   GLUCOSEU NEGATIVE 10/20/2016 1915   HGBUR SMALL (A) 10/20/2016 1915   BILIRUBINUR NEGATIVE 10/20/2016 1915   KETONESUR NEGATIVE 10/20/2016 1915   PROTEINUR 30 (A) 10/20/2016 1915   UROBILINOGEN 0.2 05/30/2008 2222   NITRITE NEGATIVE 10/20/2016 1915   LEUKOCYTESUR NEGATIVE 10/20/2016 1915   Sepsis Labs: @LABRCNTIP (procalcitonin:4,lacticidven:4) )No results found for this or any previous visit (from the past 240 hour(s)).   Radiological Exams on Admission: Dg Chest Port 1 View  Result Date: 10/20/2016 CLINICAL DATA:  Cough and fever.  History of breast carcinoma EXAM: PORTABLE CHEST 1 VIEW COMPARISON:  October 29, 2015. FINDINGS: There is no edema or consolidation. The heart size and pulmonary vascularity are normal. No adenopathy. There is atherosclerotic calcification in the aorta. There are surgical clips in the right axilla. No bone lesions  evident. IMPRESSION: No edema or consolidation.  There is aortic atherosclerosis. Electronically Signed   By: Lowella Grip III M.D.   On: 10/20/2016 16:50    EKG: Independently reviewed. Sinus rhythm.   Assessment/Plan  1. Syncope  - Pt presents with gen weakness, malaise, subj fevers, near-syncope, and one syncopal episode the night prior  - There is no focal neurologic deficit appreciated; denies CP or palpitations  - ED workup is notable for soft  BP, AKI   - Suspect this to be orthostatic  - She has already been given a liter NS in ED, but will request orthostatic vital signs  - Monitor on telemetry, continue IVF hydration, check echo   2. Acute kidney injury superimposed on CKD stage III  - SCr is 1.88 on admission, up from 1.2 a year ago  - With the acute illness, recent poor appetite, and soft BP, this is likely a prerenal azotemia  - She was given 1 liter NS in ED and will be continued on IVF hydration with NS  - Avoid nephrotoxins, renally-dose medications, repeat chem panel in am    3. Leukopenia, thrombocytopenia  - WBC is 2,800 and platelets 65k on admission  - Both indices were previously normal  - Does not appear to be on any of the more frequently implicated medications  - Possibly secondary to acute infectious process   - Check anemia panel - Repeat CBC in am    4. Influenza B  - Flu PCR returned positive for influenza B  - Continue droplet precautions - Sxs present for ~1 week, but severe enough to require hospital admission, and so renally-dosed Tamiflu is started  - Continue supportive care    5. Breast cancer, right  - Status-post lumpectomy  - Continue current management with tamoxifen   6. Depression, anxiety  - Appears to be stable  - Continue trazodone and prn Klonopin     DVT prophylaxis: SCD's  Code Status: Full  Family Communication: Husband updated at bedside Disposition Plan: Observe on telemtry Consults called: None Admission status:  Observation    Vianne Bulls, MD [facility] Pager [phone]  If 7PM-7AM, please contact night-coverage [email or web address] Password [facility]  10/20/2016, 8:44 PM

## 2016-10-20 NOTE — ED Provider Notes (Signed)
[facility] DEPT Provider Note   CSN: [id number] Arrival date & time: 10/20/16  1518     History   Chief Complaint Chief Complaint  Patient presents with  . Weakness    HPI Kathryn Ortiz is a 76 y.o. female.  HPI 76 y.o female presents today  Complaining of weakness, cough, and fever.  Began with cold symptoms one week ago.  Subjective fever since  Saturday. Coughing up white sputum.  Decreased dyspnea on oxygen here, not on at home.  Patient nonsmoker but husband with 30 year pack hiostory.  Patient without n/v/d but decreased po intake.  Ptiwent with increased weakness past two days.  Patient with syncope yesterday after standing. Denies prodrome, but has felt light headed since.   Past Medical History:  Diagnosis Date  . Anxiety    takes Clonazepam daily as needed  . Arthritis   . Back pain    from knee pain  . Breast cancer ([place])    Right breast  . Depression    takes Celexa daily  . GERD (gastroesophageal reflux disease)    takes Omeprazole daily  . Hyperlipidemia    takes Lovastatin 2 times a week  . Hypertension    was on meds but has been off x 2 yr  . Insomnia    takes Trazodone nightly  . Joint pain   . Joint swelling   . Macular degeneration    dry   . Osteoarthritis of right knee, primary localized 04/14/2015  . S/P total knee arthroplasty    R  . Sciatica   . Stenosis of carotid artery 10-27-15   R & L  . Syncope 11-13-15  . Urinary frequency   . Weakness    tingling in hands and feet    Patient Active Problem List   Diagnosis Date Noted  . Breast cancer of upper-inner quadrant of right female breast ([place]) 08/10/2015  . Postoperative anemia due to acute blood loss 04/22/2015  . GERD (gastroesophageal reflux disease)   . Hyperlipidemia   . Hypertension   . Anxiety   . Depression   . S/P total knee arthroplasty   . Osteoarthritis of right knee, primary localized 04/14/2015  . Knee osteoarthritis 04/14/2015  . SYNCOPE AND COLLAPSE 02/23/2010     Past Surgical History:  Procedure Laterality Date  . BREAST LUMPECTOMY WITH RADIOACTIVE SEED AND SENTINEL LYMPH NODE BIOPSY Right 09/28/2015   Procedure: BREAST LUMPECTOMY WITH RADIOACTIVE SEED AND SENTINEL LYMPH NODE BIOPSY;  Surgeon: Autumn Messing III, MD;  Location: [facility];  Service: General;  Laterality: Right;  . BREAST SURGERY     breast biopsy  . cataract surgery Bilateral   . COLONOSCOPY    . epidural injections    . SHOULDER SURGERY Bilateral   . TOTAL KNEE ARTHROPLASTY Right 01/12/2015  . TOTAL KNEE ARTHROPLASTY Right 04/14/2015   Procedure: TOTAL KNEE ARTHROPLASTY;  Surgeon: Marchia Bond, MD;  Location: [facility];  Service: Orthopedics;  Laterality: Right;    OB History    No data available       Home Medications    Prior to Admission medications   Medication Sig Start Date End Date Taking? Authorizing Provider  Cholecalciferol (VITAMIN D PO) Take 1 tablet by mouth daily.    Historical Provider, MD  clonazePAM (KLONOPIN) 1 MG tablet Take 0.5 mg by mouth daily as needed for anxiety.    Historical Provider, MD  Cyanocobalamin (VITAMIN B12 PO) Take 1 tablet by mouth daily. Reported  on 10/22/2015    Historical Provider, MD  gabapentin (NEURONTIN) 300 MG capsule Take 300 mg by mouth at bedtime.    Historical Provider, MD  HYDROcodone-acetaminophen (NORCO) 7.5-325 MG tablet Take 1 tablet by mouth 2 (two) times daily. 09/25/15   Historical Provider, MD  latanoprost (XALATAN) 0.005 % ophthalmic solution Place 1 drop into both eyes every morning.    Historical Provider, MD  lovastatin (MEVACOR) 10 MG tablet Take 10 mg by mouth 2 (two) times a week. Reported on 12/16/2015    Historical Provider, MD  omeprazole (PRILOSEC) 20 MG capsule Take 20 mg by mouth daily.    Historical Provider, MD  tamoxifen (NOLVADEX) 20 MG tablet Take 1 tablet (20 mg total) by mouth daily. 01/11/16   Nicholas Lose, MD  traZODone (DESYREL) 100 MG tablet Take 250 mg by mouth at bedtime. Reported on  12/16/2015    Historical Provider, MD    Family History Family History  Problem Relation Age of Onset  . Breast cancer Sister     breast  . Suicidality Father   . Heart failure Mother   . Hypertension Other   . Drug abuse Daughter   . Heart Problems Brother 35    CABG  . Osteoarthritis Sister     3 shoulder surgeries, had staph infection once  . Heart Problems Brother   . Seizures Neg Hx     Social History Social History  Substance Use Topics  . Smoking status: Never Smoker  . Smokeless tobacco: Current User    Types: Chew, Snuff     Comment: 10/26/15 snuff 1 tsp daily  . Alcohol use No     Allergies   Penicillins and Celexa [citalopram]   Review of Systems Review of Systems   Physical Exam Updated Vital Signs BP 110/78 (BP Location: Right Arm)   Pulse 91   Temp 98.6 F (37 C) (Oral)   Resp 18   Wt 66.7 kg   SpO2 95%   BMI 29.69 kg/m   Physical Exam  Constitutional: She is oriented to person, place, and time. She appears well-developed and well-nourished. No distress.  HENT:  Head: Normocephalic and atraumatic.  Right Ear: External ear normal.  Left Ear: External ear normal.  Nose: Nose normal.  Eyes: Conjunctivae and EOM are normal. Pupils are equal, round, and reactive to light.  Neck: Normal range of motion. Neck supple.  Cardiovascular: Normal rate, regular rhythm and normal heart sounds.   Pulmonary/Chest: Effort normal and breath sounds normal.  Abdominal: Soft. Bowel sounds are normal.  Musculoskeletal: Normal range of motion.  Neurological: She is alert and oriented to person, place, and time. She exhibits normal muscle tone. Coordination normal.  Skin: Skin is warm and dry.  Psychiatric: She has a normal mood and affect. Her behavior is normal. Thought content normal.  Nursing note and vitals reviewed.    ED Treatments / Results  Labs (all labs ordered are listed, but only abnormal results are displayed) Labs Reviewed  BASIC METABOLIC  PANEL - Abnormal; Notable for the following:       Result Value   Glucose, Bld 126 (*)    Creatinine, Ser 1.88 (*)    Calcium 8.4 (*)    GFR calc non Af Amer 25 (*)    GFR calc Af Amer 29 (*)    All other components within normal limits  URINALYSIS, ROUTINE W REFLEX MICROSCOPIC - Abnormal; Notable for the following:    APPearance HAZY (*)  Hgb urine dipstick SMALL (*)    Protein, ur 30 (*)    Bacteria, UA FEW (*)    Squamous Epithelial / LPF 0-5 (*)    All other components within normal limits  CBC WITH DIFFERENTIAL/PLATELET - Abnormal; Notable for the following:    WBC 2.8 (*)    HCT 35.9 (*)    Platelets 65 (*)    Lymphs Abs 0.6 (*)    All other components within normal limits  INFLUENZA PANEL BY PCR (TYPE A & B)  CBG MONITORING, ED  I-STAT CG4 LACTIC ACID, ED  I-STAT CG4 LACTIC ACID, ED    EKG  EKG Interpretation  Date/Time:  Thursday October 20 2016 19:26:07 EDT Ventricular Rate:  66 PR Interval:    QRS Duration: 84 QT Interval:  418 QTC Calculation: 438 R Axis:   84 Text Interpretation:  Sinus rhythm Probable anterior infarct, age indeterminate rate has decreased since last tracing Confirmed by Rebbeca Sheperd MD, Andee Poles [phone]) on 10/20/2016 8:00:33 PM       Radiology Dg Chest Port 1 View  Result Date: 10/20/2016 CLINICAL DATA:  Cough and fever.  History of breast carcinoma EXAM: PORTABLE CHEST 1 VIEW COMPARISON:  October 29, 2015. FINDINGS: There is no edema or consolidation. The heart size and pulmonary vascularity are normal. No adenopathy. There is atherosclerotic calcification in the aorta. There are surgical clips in the right axilla. No bone lesions evident. IMPRESSION: No edema or consolidation.  There is aortic atherosclerosis. Electronically Signed   By: Lowella Grip III M.D.   On: 10/20/2016 16:50    Procedures Procedures (including critical care time)  Medications Ordered in ED Medications - No data to display   Initial Impression / Assessment and Plan  / ED Course  I have reviewed the triage vital signs and the nursing notes.  Pertinent labs & imaging results that were available during my care of the patient were reviewed by me and considered in my medical decision making (see chart for details).     This 76 year old female who has had off and congestion for a week. He has had some decreased by mouth intake but has not had vomiting or diarrhea. She had a syncopal episode yesterday without prodrome. Evaluation here reveals hypertension likely secondary to not taking her medications today. Elevated creatinine consistent with AK I 1- uri- no evidence of pneumonia on cxr , plan flu test 2- syncope- no prodrome yesterday but has had weakness and light headedness today.  Plan monitor and ongoing evaluation 3- leukopenia- ? Secondary to 1   Final Clinical Impressions(s) / ED Diagnoses   New Prescriptions New Prescriptions   No medications on file     Pattricia Boss, MD 10/21/16 [phone]

## 2016-10-20 NOTE — ED Notes (Signed)
Attempted report x1. 

## 2016-10-20 NOTE — ED Notes (Signed)
Pt remains on bedpan per request

## 2016-10-20 NOTE — ED Triage Notes (Signed)
Pt in from home, states she has "felt bad x 2 weeks". Pt is lethargic and pale, a&ox4 and afebrile. Pt's BP 38 palpated in triage. Denies pain, endorses weakness, dizziness and nausea.

## 2016-10-21 DIAGNOSIS — F17290 Nicotine dependence, other tobacco product, uncomplicated: Secondary | ICD-10-CM | POA: Diagnosis present

## 2016-10-21 DIAGNOSIS — Z853 Personal history of malignant neoplasm of breast: Secondary | ICD-10-CM | POA: Diagnosis not present

## 2016-10-21 DIAGNOSIS — Z880 Allergy status to penicillin: Secondary | ICD-10-CM | POA: Diagnosis not present

## 2016-10-21 DIAGNOSIS — K219 Gastro-esophageal reflux disease without esophagitis: Secondary | ICD-10-CM | POA: Diagnosis present

## 2016-10-21 DIAGNOSIS — F329 Major depressive disorder, single episode, unspecified: Secondary | ICD-10-CM | POA: Diagnosis present

## 2016-10-21 DIAGNOSIS — H353190 Nonexudative age-related macular degeneration, unspecified eye, stage unspecified: Secondary | ICD-10-CM | POA: Diagnosis present

## 2016-10-21 DIAGNOSIS — M1990 Unspecified osteoarthritis, unspecified site: Secondary | ICD-10-CM | POA: Diagnosis present

## 2016-10-21 DIAGNOSIS — I10 Essential (primary) hypertension: Secondary | ICD-10-CM | POA: Diagnosis not present

## 2016-10-21 DIAGNOSIS — J101 Influenza due to other identified influenza virus with other respiratory manifestations: Secondary | ICD-10-CM | POA: Diagnosis present

## 2016-10-21 DIAGNOSIS — D709 Neutropenia, unspecified: Secondary | ICD-10-CM | POA: Diagnosis not present

## 2016-10-21 DIAGNOSIS — R531 Weakness: Secondary | ICD-10-CM | POA: Diagnosis present

## 2016-10-21 DIAGNOSIS — C50211 Malignant neoplasm of upper-inner quadrant of right female breast: Secondary | ICD-10-CM | POA: Diagnosis present

## 2016-10-21 DIAGNOSIS — E785 Hyperlipidemia, unspecified: Secondary | ICD-10-CM | POA: Diagnosis present

## 2016-10-21 DIAGNOSIS — Z888 Allergy status to other drugs, medicaments and biological substances status: Secondary | ICD-10-CM | POA: Diagnosis not present

## 2016-10-21 DIAGNOSIS — G4700 Insomnia, unspecified: Secondary | ICD-10-CM | POA: Diagnosis present

## 2016-10-21 DIAGNOSIS — Z79899 Other long term (current) drug therapy: Secondary | ICD-10-CM | POA: Diagnosis not present

## 2016-10-21 DIAGNOSIS — I129 Hypertensive chronic kidney disease with stage 1 through stage 4 chronic kidney disease, or unspecified chronic kidney disease: Secondary | ICD-10-CM | POA: Diagnosis present

## 2016-10-21 DIAGNOSIS — F419 Anxiety disorder, unspecified: Secondary | ICD-10-CM | POA: Diagnosis present

## 2016-10-21 DIAGNOSIS — Z8249 Family history of ischemic heart disease and other diseases of the circulatory system: Secondary | ICD-10-CM | POA: Diagnosis not present

## 2016-10-21 DIAGNOSIS — Z96651 Presence of right artificial knee joint: Secondary | ICD-10-CM | POA: Diagnosis present

## 2016-10-21 DIAGNOSIS — R55 Syncope and collapse: Secondary | ICD-10-CM | POA: Diagnosis not present

## 2016-10-21 DIAGNOSIS — N179 Acute kidney failure, unspecified: Secondary | ICD-10-CM | POA: Diagnosis not present

## 2016-10-21 DIAGNOSIS — N183 Chronic kidney disease, stage 3 (moderate): Secondary | ICD-10-CM | POA: Diagnosis not present

## 2016-10-21 DIAGNOSIS — J209 Acute bronchitis, unspecified: Secondary | ICD-10-CM | POA: Diagnosis present

## 2016-10-21 DIAGNOSIS — D696 Thrombocytopenia, unspecified: Secondary | ICD-10-CM | POA: Diagnosis not present

## 2016-10-21 DIAGNOSIS — D61818 Other pancytopenia: Secondary | ICD-10-CM | POA: Diagnosis present

## 2016-10-21 LAB — CBC WITH DIFFERENTIAL/PLATELET
Basophils Absolute: 0 10*3/uL (ref 0.0–0.1)
Basophils Relative: 1 %
EOS PCT: 0 %
Eosinophils Absolute: 0 10*3/uL (ref 0.0–0.7)
HEMATOCRIT: 32.6 % — AB (ref 36.0–46.0)
HEMOGLOBIN: 10.7 g/dL — AB (ref 12.0–15.0)
LYMPHS ABS: 0.9 10*3/uL (ref 0.7–4.0)
Lymphocytes Relative: 39 %
MCH: 28.9 pg (ref 26.0–34.0)
MCHC: 32.8 g/dL (ref 30.0–36.0)
MCV: 88.1 fL (ref 78.0–100.0)
Monocytes Absolute: 0.4 10*3/uL (ref 0.1–1.0)
Monocytes Relative: 18 %
NEUTROS ABS: 0.9 10*3/uL — AB (ref 1.7–7.7)
Neutrophils Relative %: 42 %
Platelets: 54 10*3/uL — ABNORMAL LOW (ref 150–400)
RBC: 3.7 MIL/uL — ABNORMAL LOW (ref 3.87–5.11)
RDW: 13.6 % (ref 11.5–15.5)
WBC: 2.2 10*3/uL — ABNORMAL LOW (ref 4.0–10.5)

## 2016-10-21 LAB — IRON AND TIBC
IRON: 25 ug/dL — AB (ref 28–170)
SATURATION RATIOS: 10 % — AB (ref 10.4–31.8)
TIBC: 253 ug/dL (ref 250–450)
UIBC: 228 ug/dL

## 2016-10-21 LAB — BASIC METABOLIC PANEL
Anion gap: 7 (ref 5–15)
BUN: 16 mg/dL (ref 6–20)
CHLORIDE: 106 mmol/L (ref 101–111)
CO2: 24 mmol/L (ref 22–32)
CREATININE: 1.37 mg/dL — AB (ref 0.44–1.00)
Calcium: 7.5 mg/dL — ABNORMAL LOW (ref 8.9–10.3)
GFR calc Af Amer: 42 mL/min — ABNORMAL LOW (ref >60)
GFR, EST NON AFRICAN AMERICAN: 36 mL/min — AB (ref >60)
GLUCOSE: 93 mg/dL (ref 65–99)
Potassium: 3.7 mmol/L (ref 3.5–5.1)
Sodium: 137 mmol/L (ref 135–145)

## 2016-10-21 LAB — RETICULOCYTES
RBC.: 3.96 MIL/uL (ref 3.87–5.11)
RETIC CT PCT: 0.4 % (ref 0.4–3.1)
Retic Count, Absolute: 15.8 10*3/uL — ABNORMAL LOW (ref 19.0–186.0)

## 2016-10-21 LAB — GLUCOSE, CAPILLARY: Glucose-Capillary: 82 mg/dL (ref 65–99)

## 2016-10-21 LAB — TSH: TSH: 1.673 u[IU]/mL (ref 0.350–4.500)

## 2016-10-21 LAB — FERRITIN: FERRITIN: 616 ng/mL — AB (ref 11–307)

## 2016-10-21 LAB — VITAMIN B12: Vitamin B-12: 1457 pg/mL — ABNORMAL HIGH (ref 180–914)

## 2016-10-21 LAB — FOLATE: Folate: 28.3 ng/mL (ref >5.9)

## 2016-10-21 LAB — SODIUM, URINE, RANDOM: Sodium, Ur: 30 mmol/L

## 2016-10-21 LAB — CREATININE, URINE, RANDOM: Creatinine, Urine: 221.16 mg/dL

## 2016-10-21 LAB — PATHOLOGIST SMEAR REVIEW

## 2016-10-21 MED ORDER — FLUTICASONE PROPIONATE 50 MCG/ACT NA SUSP
1.0000 | Freq: Every day | NASAL | Status: DC
  Administered 2016-10-21 – 2016-10-24 (×4): 1 via NASAL
  Filled 2016-10-21: qty 16

## 2016-10-21 MED ORDER — IPRATROPIUM-ALBUTEROL 0.5-2.5 (3) MG/3ML IN SOLN
3.0000 mL | Freq: Four times a day (QID) | RESPIRATORY_TRACT | Status: DC
  Administered 2016-10-21 (×2): 3 mL via RESPIRATORY_TRACT
  Filled 2016-10-21 (×2): qty 3

## 2016-10-21 MED ORDER — POLYETHYLENE GLYCOL 3350 17 G PO PACK
17.0000 g | PACK | Freq: Every day | ORAL | Status: DC
  Administered 2016-10-21 – 2016-10-23 (×3): 17 g via ORAL
  Filled 2016-10-21 (×4): qty 1

## 2016-10-21 MED ORDER — METHYLPREDNISOLONE SODIUM SUCC 125 MG IJ SOLR
60.0000 mg | Freq: Three times a day (TID) | INTRAMUSCULAR | Status: DC
Start: 2016-10-21 — End: 2016-10-23
  Administered 2016-10-21 – 2016-10-23 (×7): 60 mg via INTRAVENOUS
  Filled 2016-10-21 (×7): qty 2

## 2016-10-21 MED ORDER — GUAIFENESIN ER 600 MG PO TB12
1200.0000 mg | ORAL_TABLET | Freq: Two times a day (BID) | ORAL | Status: DC
  Administered 2016-10-21 – 2016-10-24 (×7): 1200 mg via ORAL
  Filled 2016-10-21 (×8): qty 2

## 2016-10-21 MED ORDER — BENZONATATE 100 MG PO CAPS
200.0000 mg | ORAL_CAPSULE | Freq: Three times a day (TID) | ORAL | Status: DC | PRN
  Administered 2016-10-22: 200 mg via ORAL
  Filled 2016-10-21 (×2): qty 2

## 2016-10-21 MED ORDER — OXYMETAZOLINE HCL 0.05 % NA SOLN
1.0000 | Freq: Two times a day (BID) | NASAL | Status: AC
  Administered 2016-10-21 – 2016-10-23 (×6): 1 via NASAL
  Filled 2016-10-21: qty 15

## 2016-10-21 MED ORDER — OSELTAMIVIR PHOSPHATE 30 MG PO CAPS
30.0000 mg | ORAL_CAPSULE | Freq: Every day | ORAL | Status: DC
  Administered 2016-10-21 – 2016-10-22 (×2): 30 mg via ORAL
  Filled 2016-10-21 (×3): qty 1

## 2016-10-21 MED ORDER — BISACODYL 5 MG PO TBEC
5.0000 mg | DELAYED_RELEASE_TABLET | Freq: Every day | ORAL | Status: DC | PRN
  Administered 2016-10-22: 5 mg via ORAL
  Filled 2016-10-21 (×2): qty 1

## 2016-10-21 MED ORDER — LORATADINE 10 MG PO TABS
10.0000 mg | ORAL_TABLET | Freq: Every day | ORAL | Status: DC
  Administered 2016-10-21 – 2016-10-24 (×4): 10 mg via ORAL
  Filled 2016-10-21 (×4): qty 1

## 2016-10-21 NOTE — Evaluation (Signed)
Physical Therapy Evaluation Patient Details Name: Kathryn Ortiz MRN: [id number] DOB: 1941/02/08 Today's Date: 10/21/2016   History of Present Illness  Patient is a 76 yo female admitted 10/20/16 with syncope, fevers, cough, and weakness.   Patient with acute bronchitis due to Influenza.     PMH:  anxiety, depression, breast CA  Clinical Impression  Patient presents with problems listed below.  Will benefit from acute PT to maximize functional independence prior to d/c home with husband.  Patient is assisting husband who is going through cancer treatment.  Today patient with decreased strength and balance.  Recommend HHPT at d/c for continued therapy.    Follow Up Recommendations Home health PT;Supervision - Intermittent    Equipment Recommendations  None recommended by PT    Recommendations for Other Services       Precautions / Restrictions Precautions Precautions: Fall Restrictions Weight Bearing Restrictions: No      Mobility  Bed Mobility Overal bed mobility: Modified Independent             General bed mobility comments: Increased time to move to EOB.  Transfers Overall transfer level: Needs assistance Equipment used: None Transfers: Sit to/from Stand Sit to Stand: Min guard         General transfer comment: Assist for safety/balance.  Ambulation/Gait Ambulation/Gait assistance: Min guard Ambulation Distance (Feet): 30 Feet Assistive device: None Gait Pattern/deviations: Step-through pattern;Decreased stride length Gait velocity: decreased Gait velocity interpretation: Below normal speed for age/gender General Gait Details: Patient with slow, slightly unsteady gait.    Stairs            Wheelchair Mobility    Modified Rankin (Stroke Patients Only)       Balance Overall balance assessment: Needs assistance Sitting-balance support: No upper extremity supported;Feet supported Sitting balance-Leahy Scale: Good     Standing balance support:  No upper extremity supported Standing balance-Leahy Scale: Good Standing balance comment: Slightly unsteady with dynamic activities                             Pertinent Vitals/Pain Pain Assessment: No/denies pain    Home Living Family/patient expects to be discharged to:: Private residence Living Arrangements: Spouse/significant other Available Help at Discharge: Family;Available 24 hours/day (Husband being treated for CA. Supportive family.) Type of Home: House Home Access: Stairs to enter Entrance Stairs-Rails: Psychiatric nurse of Steps: 3 Home Layout: Two level;Bed/bath upstairs Home Equipment: Crutches;Walker - 2 wheels      Prior Function Level of Independence: Independent         Comments: Patient assisting husband who is being treated for cancer.      Hand Dominance        Extremity/Trunk Assessment   Upper Extremity Assessment Upper Extremity Assessment: Overall WFL for tasks assessed    Lower Extremity Assessment Lower Extremity Assessment: Generalized weakness       Communication   Communication: No difficulties  Cognition Arousal/Alertness: Awake/alert Behavior During Therapy: Anxious (Tearful talking about husband.  Brother passed last week.) Overall Cognitive Status: Within Functional Limits for tasks assessed                                        General Comments      Exercises     Assessment/Plan    PT Assessment Patient needs continued PT services  PT Problem  List Decreased strength;Decreased activity tolerance;Decreased balance;Decreased mobility;Decreased knowledge of use of DME;Cardiopulmonary status limiting activity       PT Treatment Interventions DME instruction;Gait training;Stair training;Functional mobility training;Therapeutic activities;Therapeutic exercise;Balance training;Patient/family education    PT Goals (Current goals can be found in the Care Plan section)  Acute Rehab  PT Goals Patient Stated Goal: To get back to normal.  To return home. PT Goal Formulation: With patient Time For Goal Achievement: 10/28/16 Potential to Achieve Goals: Good    Frequency Min 3X/week   Barriers to discharge        Co-evaluation               End of Session Equipment Utilized During Treatment: Gait belt Activity Tolerance: Patient tolerated treatment well;Patient limited by fatigue Patient left: in bed;with call bell/phone within reach;with family/visitor present   PT Visit Diagnosis: Unsteadiness on feet (R26.81);Other abnormalities of gait and mobility (R26.89);Muscle weakness (generalized) (M62.81)    Time: 8250-0370 PT Time Calculation (min) (ACUTE ONLY): 19 min   Charges:   PT Evaluation $PT Eval Moderate Complexity: 1 Procedure     PT G Codes:   PT G-Codes **NOT FOR INPATIENT CLASS** Functional Assessment Tool Used: AM-PAC 6 Clicks Basic Mobility;Clinical judgement Functional Limitation: Mobility: Walking and moving around Mobility: Walking and Moving Around Current Status (W8889): At least 20 percent but less than 40 percent impaired, limited or restricted Mobility: Walking and Moving Around Goal Status [phone]): 0 percent impaired, limited or restricted    Carita Pian. Rosana Hoes PT, [facility] Acute Rehab Services Pager [place] 10/21/2016, 6:02 PM

## 2016-10-21 NOTE — Progress Notes (Addendum)
PROGRESS NOTE        PATIENT DETAILS Name: Kathryn Ortiz Age: 76 y.o. Sex: female Date of Birth: [birth year] Admit Date: 10/20/2016 Admitting Physician Vianne Bulls, MD DSK:[email or web address] Maudie Mercury, MD  Brief Narrative: Patient is a 76 y.o. female with history of anxiety or depression, history of right breast cancer status post lumpectomy, admitted with several days history of subjective fevers, cough and worsening generalized weakness. She experienced lightheadedness, and a syncopal episode at home on 3/29. She was evaluated in the emergency room, found to have acute bronchitis secondary to influenza. She was admitted for further evaluation and treatment. See below for further details  Subjective: Still coughing-some audible wheezing evident.Also complains of significant nasal/sinus congestion.  Looks very frail.  ROS: No chest pain No dysuria No headache No Nausea,vomiting or diarrhea  Assessment/Plan: Syncope: Suspect either vasovagal mechanism or any orthostatic mechanism in a setting of bronchitis/influenza. Continue telemetry monitoring, await echocardiogram.  Acute bronchitis: Likely triggered by influenza-she is wheezing. Also complains of significant nasal/sinus congestion. Start IV Solu-Medrol, bronchodilators and other supportive measures. Start Anti-sinus therapy with Claritin, Flonase and Afrin nasal spray. Encourage incentive spirometry/flutter valve. Follow clinical course.  Influenza: Continue Tamiflu-see above-follow clinical course.  Pancytopenia: Suspect related to influenza. Plans are to follow CBC closely, if no improvement-will then consider further workup/hematology consultation. Vitamin B12 within normal limits.  Acute kidney injury on chronic kidney disease stage III: Acute kidney injury likely mild prerenal azotemia in a setting of influenza. Improving with supportive measures. Follow electrolytes periodically.  History of right-sided breast  cancer: Status post lumpectomy-on tamoxifen.  Depression/anxiety: Slightly more anxious due to ongoing influenza infection. Continue with trazodone and as needed Klonopin.  Generalized weakness: Suspect secondary to acute illness, PT evaluation prior to discharge.  DVT Prophylaxis:  SCD's-Given thrombocytopenia  Code Status: Full code   Family Communication: None at bedside  Disposition Plan: Remain inpatient-requires a few more days prior to discharge  Antimicrobial agents: Anti-infectives    Start     Dose/Rate Route Frequency Ordered Stop   10/21/16 0115  oseltamivir (TAMIFLU) capsule 30 mg     30 mg Oral Daily 10/21/16 0051 10/26/16 0959      Procedures: None  CONSULTS:  None  Time spent: 25- minutes-Greater than 50% of this time was spent in counseling, explanation of diagnosis, planning of further management, and coordination of care.  MEDICATIONS: Scheduled Meds: . cholecalciferol  1,000 Units Oral Daily  . fluticasone  1 spray Each Nare Daily  . gabapentin  300 mg Oral QHS  . guaiFENesin  1,200 mg Oral BID  . ipratropium-albuterol  3 mL Nebulization Q6H  . latanoprost  1 drop Both Eyes q morning - 10a  . loratadine  10 mg Oral Daily  . methylPREDNISolone (SOLU-MEDROL) injection  60 mg Intravenous Q8H  . oseltamivir  30 mg Oral Daily  . oxymetazoline  1 spray Each Nare BID  . pantoprazole  40 mg Oral Daily  . sodium chloride flush  3 mL Intravenous Q12H  . tamoxifen  20 mg Oral Daily  . traZODone  250 mg Oral QHS   Continuous Infusions: PRN Meds:.acetaminophen **OR** acetaminophen, benzonatate, clonazePAM, HYDROcodone-acetaminophen, ondansetron **OR** ondansetron (ZOFRAN) IV   PHYSICAL EXAM: Vital signs: Vitals:   10/20/16 2045 10/20/16 2240 10/21/16 0413 10/21/16 0417  BP: (!) 111/48 106/85 (!) 135/35 (!) 139/54  Pulse:  62 67 66 70  Resp: 13 19 18    Temp:  98.6 F (37 C) (!) 100.9 F (38.3 C)   TempSrc:  Oral Oral   SpO2: 93% 98% 96%     Weight:  63 kg (138 lb 14.4 oz)    Height:  5\' 1"  (1.549 m)     Filed Weights   10/20/16 1550 10/20/16 2240  Weight: 66.7 kg (147 lb) 63 kg (138 lb 14.4 oz)   Body mass index is 26.24 kg/m.   General appearance :Awake, alert, not in any distress.  Eyes:, pupils equally reactive to light and accomodation,no scleral icterus. HEENT: Atraumatic and Normocephalic Neck: supple, no JVD. No cervical lymphadenopathy.  Resp:Good air entry bilaterally,Coarse rhonchi all over CVS: S1 S2 regular, no murmurs.  GI: Bowel sounds present, Non tender and not distended with no gaurding, rigidity or rebound.No organomegaly Extremities: B/L Lower Ext shows no edema, both legs are warm to touch Neurology:  speech clear,Non focal, sensation is grossly intact. Psychiatric: Normal judgment and insight. Alert and oriented x 3. Normal mood. Musculoskeletal:No digital cyanosis Skin:No Rash, warm and dry Wounds:N/A  I have personally reviewed following labs and imaging studies  LABORATORY DATA: CBC:  Recent Labs Lab 10/20/16 1647 10/21/16 0559  WBC 2.8* 2.2*  NEUTROABS 1.8 0.9*  HGB 12.1 10.7*  HCT 35.9* 32.6*  MCV 87.8 88.1  PLT 65* 54*    Basic Metabolic Panel:  Recent Labs Lab 10/20/16 1553 10/21/16 0559  NA 137 137  K 4.3 3.7  CL 103 106  CO2 23 24  GLUCOSE 126* 93  BUN 17 16  CREATININE 1.88* 1.37*  CALCIUM 8.4* 7.5*    GFR: Estimated Creatinine Clearance: 29.7 mL/min (A) (by C-G formula based on SCr of 1.37 mg/dL (H)).  Liver Function Tests: No results for input(s): AST, ALT, ALKPHOS, BILITOT, PROT, ALBUMIN in the last 168 hours. No results for input(s): LIPASE, AMYLASE in the last 168 hours. No results for input(s): AMMONIA in the last 168 hours.  Coagulation Profile: No results for input(s): INR, PROTIME in the last 168 hours.  Cardiac Enzymes: No results for input(s): CKTOTAL, CKMB, CKMBINDEX, TROPONINI in the last 168 hours.  BNP (last 3 results) No results for  input(s): PROBNP in the last 8760 hours.  HbA1C: No results for input(s): HGBA1C in the last 72 hours.  CBG:  Recent Labs Lab 10/21/16 0828  GLUCAP 82    Lipid Profile: No results for input(s): CHOL, HDL, LDLCALC, TRIG, CHOLHDL, LDLDIRECT in the last 72 hours.  Thyroid Function Tests:  Recent Labs  10/20/16 2234  TSH 1.673    Anemia Panel:  Recent Labs  10/20/16 2234  VITAMINB12 1,457*  FOLATE 28.3  FERRITIN 616*  TIBC 253  IRON 25*  RETICCTPCT 0.4    Urine analysis:    Component Value Date/Time   COLORURINE YELLOW 10/20/2016 1915   APPEARANCEUR HAZY (A) 10/20/2016 1915   LABSPEC 1.018 10/20/2016 1915   PHURINE 5.0 10/20/2016 1915   GLUCOSEU NEGATIVE 10/20/2016 1915   HGBUR SMALL (A) 10/20/2016 1915   BILIRUBINUR NEGATIVE 10/20/2016 1915   KETONESUR NEGATIVE 10/20/2016 1915   PROTEINUR 30 (A) 10/20/2016 1915   UROBILINOGEN 0.2 05/30/2008 2222   NITRITE NEGATIVE 10/20/2016 1915   LEUKOCYTESUR NEGATIVE 10/20/2016 1915    Sepsis Labs: Lactic Acid, Venous    Component Value Date/Time   LATICACIDVEN 0.70 10/20/2016 1955    MICROBIOLOGY: No results found for this or any previous visit (from the past 240 hour(s)).  RADIOLOGY STUDIES/RESULTS: Dg Chest Port 1 View  Result Date: 10/20/2016 CLINICAL DATA:  Cough and fever.  History of breast carcinoma EXAM: PORTABLE CHEST 1 VIEW COMPARISON:  October 29, 2015. FINDINGS: There is no edema or consolidation. The heart size and pulmonary vascularity are normal. No adenopathy. There is atherosclerotic calcification in the aorta. There are surgical clips in the right axilla. No bone lesions evident. IMPRESSION: No edema or consolidation.  There is aortic atherosclerosis. Electronically Signed   By: Lowella Grip III M.D.   On: 10/20/2016 16:50     LOS: 0 days   Oren Binet, MD  [facility] Pager:[phone]  If 7PM-7AM, please contact night-coverage [email or web address] Password [email or web address] 10/21/2016, 10:41  AM

## 2016-10-22 ENCOUNTER — Inpatient Hospital Stay (HOSPITAL_COMMUNITY): Payer: Medicare Other

## 2016-10-22 DIAGNOSIS — R55 Syncope and collapse: Secondary | ICD-10-CM

## 2016-10-22 LAB — ECHOCARDIOGRAM COMPLETE
HEIGHTINCHES: 61 in
WEIGHTICAEL: 2236.8 [oz_av]

## 2016-10-22 LAB — CBC WITH DIFFERENTIAL/PLATELET
BASOS ABS: 0 10*3/uL (ref 0.0–0.1)
BASOS PCT: 0 %
EOS ABS: 0 10*3/uL (ref 0.0–0.7)
Eosinophils Relative: 0 %
HCT: 36.8 % (ref 36.0–46.0)
HEMOGLOBIN: 12.2 g/dL (ref 12.0–15.0)
LYMPHS PCT: 16 %
Lymphs Abs: 0.4 10*3/uL — ABNORMAL LOW (ref 0.7–4.0)
MCH: 29.3 pg (ref 26.0–34.0)
MCHC: 33.2 g/dL (ref 30.0–36.0)
MCV: 88.2 fL (ref 78.0–100.0)
MONO ABS: 0.2 10*3/uL (ref 0.1–1.0)
Monocytes Relative: 7 %
NEUTROS PCT: 77 %
Neutro Abs: 2 10*3/uL (ref 1.7–7.7)
PLATELETS: 70 10*3/uL — AB (ref 150–400)
RBC: 4.17 MIL/uL (ref 3.87–5.11)
RDW: 13.8 % (ref 11.5–15.5)
WBC: 2.6 10*3/uL — ABNORMAL LOW (ref 4.0–10.5)

## 2016-10-22 LAB — GLUCOSE, CAPILLARY
GLUCOSE-CAPILLARY: 222 mg/dL — AB (ref 65–99)
GLUCOSE-CAPILLARY: 188 mg/dL — AB (ref 65–99)

## 2016-10-22 LAB — BASIC METABOLIC PANEL
ANION GAP: 11 (ref 5–15)
BUN: 16 mg/dL (ref 6–20)
CALCIUM: 8.5 mg/dL — AB (ref 8.9–10.3)
CO2: 20 mmol/L — ABNORMAL LOW (ref 22–32)
CREATININE: 1.31 mg/dL — AB (ref 0.44–1.00)
Chloride: 107 mmol/L (ref 101–111)
GFR, EST AFRICAN AMERICAN: 45 mL/min — AB (ref >60)
GFR, EST NON AFRICAN AMERICAN: 38 mL/min — AB (ref >60)
Glucose, Bld: 268 mg/dL — ABNORMAL HIGH (ref 65–99)
Potassium: 4.2 mmol/L (ref 3.5–5.1)
SODIUM: 138 mmol/L (ref 135–145)

## 2016-10-22 LAB — UREA NITROGEN, URINE: Urea Nitrogen, Ur: 605 mg/dL

## 2016-10-22 MED ORDER — IPRATROPIUM-ALBUTEROL 0.5-2.5 (3) MG/3ML IN SOLN: 3.0000 mL | RESPIRATORY_TRACT | Status: DC | PRN

## 2016-10-22 MED ORDER — OSELTAMIVIR PHOSPHATE 30 MG PO CAPS
30.0000 mg | ORAL_CAPSULE | Freq: Two times a day (BID) | ORAL | Status: DC
  Administered 2016-10-22 – 2016-10-24 (×4): 30 mg via ORAL
  Filled 2016-10-22 (×4): qty 1

## 2016-10-22 MED ORDER — OSELTAMIVIR PHOSPHATE 30 MG PO CAPS: 30.0000 mg | ORAL_CAPSULE | Freq: Two times a day (BID) | ORAL | Status: DC

## 2016-10-22 NOTE — Progress Notes (Signed)
qPhysical Therapy Treatment Patient Details Name: Kathryn Ortiz MRN: [id number] DOB: 06-Dec-1940 Today's Date: 10/22/2016    History of Present Illness Patient is a 76 yo female admitted 10/20/16 with syncope, fevers, cough, and weakness.   Patient with acute bronchitis due to Influenza.     PMH:  anxiety, depression, breast CA    PT Comments    Patient making good progress with mobility and gait.  Improved balance today.  Able to negotiate 11 stairs with min guard assist.   Follow Up Recommendations  Home health PT;Supervision - Intermittent     Equipment Recommendations  None recommended by PT    Recommendations for Other Services       Precautions / Restrictions Precautions Precautions: Fall Restrictions Weight Bearing Restrictions: No    Mobility  Bed Mobility Overal bed mobility: Modified Independent             General bed mobility comments: Increased time to move to EOB.  Able to don socks at EOB independently.  Transfers Overall transfer level: Needs assistance Equipment used: None Transfers: Sit to/from Stand Sit to Stand: Supervision         General transfer comment: Assist for safety/balance.  Ambulation/Gait Ambulation/Gait assistance: Supervision Ambulation Distance (Feet): 200 Feet Assistive device: None Gait Pattern/deviations: Step-through pattern;Decreased stride length Gait velocity: decreased Gait velocity interpretation: Below normal speed for age/gender General Gait Details: Patient with slow, steady gait today.  Supervision for safety only.   Stairs Stairs: Yes   Stair Management: One rail Right;Step to pattern;Forwards Number of Stairs: 11 General stair comments: Patient able to negotiate 11 stairs with min guard assist for safety.  Instructed patient to use 1 rail, and step-to pattern.  Wheelchair Mobility    Modified Rankin (Stroke Patients Only)       Balance   Sitting-balance support: No upper extremity  supported;Feet supported Sitting balance-Leahy Scale: Normal     Standing balance support: No upper extremity supported Standing balance-Leahy Scale: Good Standing balance comment: Improved balance today.                            Cognition Arousal/Alertness: Awake/alert Behavior During Therapy: WFL for tasks assessed/performed (Better spirits today) Overall Cognitive Status: Within Functional Limits for tasks assessed                                        Exercises      General Comments        Pertinent Vitals/Pain Pain Assessment: No/denies pain    Home Living                      Prior Function            PT Goals (current goals can now be found in the care plan section) Acute Rehab PT Goals Patient Stated Goal: To get back to normal.  To return home. Progress towards PT goals: Progressing toward goals    Frequency    Min 3X/week      PT Plan Current plan remains appropriate    Co-evaluation             End of Session Equipment Utilized During Treatment: Gait belt Activity Tolerance: Patient tolerated treatment well Patient left: in bed;with call bell/phone within reach;with bed alarm set Nurse Communication: Mobility status PT Visit Diagnosis: Unsteadiness  on feet (R26.81);Other abnormalities of gait and mobility (R26.89);Muscle weakness (generalized) (M62.81)     Time: 7824-2353 PT Time Calculation (min) (ACUTE ONLY): 17 min  Charges:  $Gait Training: 8-22 mins                    G Codes:       Carita Pian. Sanjuana Kava, [facility] Acute Rehab Services Pager [place] 10/22/2016, 5:44 PM

## 2016-10-22 NOTE — Progress Notes (Signed)
   10/22/16 0649 10/22/16 6389 10/22/16 0657  Orthostatic Lying   BP- Lying 161/63 --  --   Pulse- Lying 70 --  --   Orthostatic Sitting  BP- Sitting --  152/70 --   Pulse- Sitting --  71 --   Orthostatic Standing at 3 minutes  BP- Standing at 3 minutes --  --  135/63  Pulse- Standing at 3 minutes --  --  91

## 2016-10-22 NOTE — Progress Notes (Signed)
Echocardiogram 2D Echocardiogram has been performed.  Aggie Cosier 10/22/2016, 2:34 PM

## 2016-10-22 NOTE — Progress Notes (Signed)
PROGRESS NOTE        PATIENT DETAILS Name: Kathryn Ortiz Age: 76 y.o. Sex: female Date of Birth: [birth year] Admit Date: 10/20/2016 Admitting Physician Vianne Bulls, MD [id number] Maudie Mercury, MD  Brief Narrative:  Patient is a 76 y.o. female with history of anxiety or depression, history of right breast cancer status post lumpectomy, admitted with several days history of subjective fevers, cough and worsening generalized weakness. She experienced lightheadedness, and a syncopal episode at home on 3/29. She was evaluated in the emergency room, found to have acute bronchitis secondary to influenza. She was admitted for further evaluation and treatment. See below for further details  Subjective:  Patient in bed, denies any headache, no chest pain or shortness of breath. Still feels weak all over. No focal weakness.  Assessment/Plan:  Syncope: Orthostatic in the setting of bronchitis/influenza. Orthostatics improved but still mildly orthostatic this morning, had TED stockings, increase activity, evaluated by PT, will require home health PT upon discharge, Continue telemetry monitoring, await echocardiogram.  Acute bronchitis: Likely triggered by influenza-she is wheezing. Also complains of significant nasal/sinus congestion. Start IV Solu-Medrol, bronchodilators and other supportive measures. Start Anti-sinus therapy with Claritin, Flonase and Afrin nasal spray. Encourage incentive spirometry/flutter valve. Follow clinical course.  Influenza: Continue Tamiflu-see above-follow clinical course.  Pancytopenia: Suspect related to influenza. Plans are to follow CBC closely, if no improvement-will then consider further workup/hematology consultation. Vitamin B12 within normal limits.  Acute kidney injury on chronic kidney disease stage III: Acute kidney injury likely mild prerenal azotemia in a setting of influenza. Improving with supportive measures. Follow electrolytes  periodically.  History of right-sided breast cancer: Status post lumpectomy-on tamoxifen.  Depression/anxiety: Slightly more anxious due to ongoing influenza infection. Continue with trazodone and as needed Klonopin.  Generalized weakness: Suspect secondary to acute illness, PT evaluation done, will advise for [facility] PD which has been ordered. Patient requests to stay in the hospital for another day, likely discharge in the morning if stable.   DVT Prophylaxis:  SCD's-Given thrombocytopenia  Code Status: Full code   Family Communication: None at bedside  Disposition Plan: Patient still feels quite weak, requests one more day of hospital stay likely discharge in the morning with home health PT.  Antimicrobial agents: Anti-infectives    Start     Dose/Rate Route Frequency Ordered Stop   10/21/16 0115  oseltamivir (TAMIFLU) capsule 30 mg     30 mg Oral Daily 10/21/16 0051 10/26/16 0959      Procedures: None  CONSULTS:  None  Time spent: 25- minutes-Greater than 50% of this time was spent in counseling, explanation of diagnosis, planning of further management, and coordination of care.  MEDICATIONS: Scheduled Meds: . cholecalciferol  1,000 Units Oral Daily  . fluticasone  1 spray Each Nare Daily  . gabapentin  300 mg Oral QHS  . guaiFENesin  1,200 mg Oral BID  . latanoprost  1 drop Both Eyes q morning - 10a  . loratadine  10 mg Oral Daily  . methylPREDNISolone (SOLU-MEDROL) injection  60 mg Intravenous Q8H  . oseltamivir  30 mg Oral Daily  . oxymetazoline  1 spray Each Nare BID  . pantoprazole  40 mg Oral Daily  . polyethylene glycol  17 g Oral Daily  . sodium chloride flush  3 mL Intravenous Q12H  . tamoxifen  20 mg Oral Daily  .  traZODone  250 mg Oral QHS   Continuous Infusions: PRN Meds:.acetaminophen **OR** [DISCONTINUED] acetaminophen, benzonatate, bisacodyl, clonazePAM, HYDROcodone-acetaminophen, ipratropium-albuterol, ondansetron **OR** ondansetron (ZOFRAN)  IV   PHYSICAL EXAM: Vital signs: Vitals:   10/22/16 0649 10/22/16 0652 10/22/16 0657 10/22/16 1041  BP: (!) 161/63 (!) 152/70 135/63 (!) 175/59  Pulse: 70 71 91 76  Resp:    18  Temp:    97.7 F (36.5 C)  TempSrc:      SpO2: 95% 97% 98% 97%  Weight:      Height:       Filed Weights   10/20/16 1550 10/20/16 2240 10/22/16 0539  Weight: 66.7 kg (147 lb) 63 kg (138 lb 14.4 oz) 63.4 kg (139 lb 12.8 oz)   Body mass index is 26.41 kg/m.   General appearance :Awake, alert, not in any distress.  Eyes:, pupils equally reactive to light and accomodation,no scleral icterus. HEENT: Atraumatic and Normocephalic Neck: supple, no JVD. No cervical lymphadenopathy.  Resp:Good air entry bilaterally,Coarse rhonchi all over CVS: S1 S2 regular, no murmurs.  GI: Bowel sounds present, Non tender and not distended with no gaurding, rigidity or rebound.No organomegaly Extremities: B/L Lower Ext shows no edema, both legs are warm to touch Neurology:  speech clear,Non focal, sensation is grossly intact. Psychiatric: Normal judgment and insight. Alert and oriented x 3. Normal mood. Musculoskeletal:No digital cyanosis Skin:No Rash, warm and dry Wounds:N/A  I have personally reviewed following labs and imaging studies  LABORATORY DATA: CBC:  Recent Labs Lab 10/20/16 1647 10/21/16 0559 10/22/16 0406  WBC 2.8* 2.2* 2.6*  NEUTROABS 1.8 0.9* 2.0  HGB 12.1 10.7* 12.2  HCT 35.9* 32.6* 36.8  MCV 87.8 88.1 88.2  PLT 65* 54* 70*    Basic Metabolic Panel:  Recent Labs Lab 10/20/16 1553 10/21/16 0559 10/22/16 0406  NA 137 137 138  K 4.3 3.7 4.2  CL 103 106 107  CO2 23 24 20*  GLUCOSE 126* 93 268*  BUN 17 16 16   CREATININE 1.88* 1.37* 1.31*  CALCIUM 8.4* 7.5* 8.5*    GFR: Estimated Creatinine Clearance: 31.1 mL/min (A) (by C-G formula based on SCr of 1.31 mg/dL (H)).  Liver Function Tests: No results for input(s): AST, ALT, ALKPHOS, BILITOT, PROT, ALBUMIN in the last 168 hours. No  results for input(s): LIPASE, AMYLASE in the last 168 hours. No results for input(s): AMMONIA in the last 168 hours.  Coagulation Profile: No results for input(s): INR, PROTIME in the last 168 hours.  Cardiac Enzymes: No results for input(s): CKTOTAL, CKMB, CKMBINDEX, TROPONINI in the last 168 hours.  BNP (last 3 results) No results for input(s): PROBNP in the last 8760 hours.  HbA1C: No results for input(s): HGBA1C in the last 72 hours.  CBG:  Recent Labs Lab 10/21/16 0828 10/22/16 0612 10/22/16 0742  GLUCAP 82 222* 188*    Lipid Profile: No results for input(s): CHOL, HDL, LDLCALC, TRIG, CHOLHDL, LDLDIRECT in the last 72 hours.  Thyroid Function Tests:  Recent Labs  10/20/16 2234  TSH 1.673    Anemia Panel:  Recent Labs  10/20/16 2234  VITAMINB12 1,457*  FOLATE 28.3  FERRITIN 616*  TIBC 253  IRON 25*  RETICCTPCT 0.4    Urine analysis:    Component Value Date/Time   COLORURINE YELLOW 10/20/2016 1915   APPEARANCEUR HAZY (A) 10/20/2016 1915   LABSPEC 1.018 10/20/2016 1915   PHURINE 5.0 10/20/2016 1915   GLUCOSEU NEGATIVE 10/20/2016 1915   HGBUR SMALL (A) 10/20/2016 1915   BILIRUBINUR  NEGATIVE 10/20/2016 [place] 10/20/2016 1915   PROTEINUR 30 (A) 10/20/2016 1915   UROBILINOGEN 0.2 05/30/2008 2222   NITRITE NEGATIVE 10/20/2016 1915   LEUKOCYTESUR NEGATIVE 10/20/2016 1915    Sepsis Labs: Lactic Acid, Venous    Component Value Date/Time   LATICACIDVEN 0.70 10/20/2016 1955    MICROBIOLOGY: No results found for this or any previous visit (from the past 240 hour(s)).  RADIOLOGY STUDIES/RESULTS: Dg Chest Port 1 View  Result Date: 10/20/2016 CLINICAL DATA:  Cough and fever.  History of breast carcinoma EXAM: PORTABLE CHEST 1 VIEW COMPARISON:  October 29, 2015. FINDINGS: There is no edema or consolidation. The heart size and pulmonary vascularity are normal. No adenopathy. There is atherosclerotic calcification in the aorta. There are  surgical clips in the right axilla. No bone lesions evident. IMPRESSION: No edema or consolidation.  There is aortic atherosclerosis. Electronically Signed   By: Lowella Grip III M.D.   On: 10/20/2016 16:50     LOS: 1 day   Signature  Lala Lund K M.D on 10/22/2016 at 11:51 AM  Between 7am to 7pm - Pager - [phone] ( page via [facility], text pages only, please mention full 10 digit call back number).  After 7pm go to [email or web address] - password [facility]

## 2016-10-23 DIAGNOSIS — I10 Essential (primary) hypertension: Secondary | ICD-10-CM

## 2016-10-23 LAB — GLUCOSE, CAPILLARY
GLUCOSE-CAPILLARY: 152 mg/dL — AB (ref 65–99)
GLUCOSE-CAPILLARY: 147 mg/dL — AB (ref 65–99)

## 2016-10-23 MED ORDER — PREDNISONE 50 MG PO TABS
50.0000 mg | ORAL_TABLET | Freq: Every day | ORAL | Status: DC
  Administered 2016-10-24: 50 mg via ORAL
  Filled 2016-10-23: qty 1

## 2016-10-23 MED ORDER — ALUM & MAG HYDROXIDE-SIMETH 200-200-20 MG/5ML PO SUSP
15.0000 mL | Freq: Four times a day (QID) | ORAL | Status: DC | PRN
  Administered 2016-10-23: 15 mL via ORAL
  Filled 2016-10-23: qty 30

## 2016-10-23 NOTE — Progress Notes (Signed)
PROGRESS NOTE        PATIENT DETAILS Name: Kathryn Ortiz Age: 76 y.o. Sex: female Date of Birth: 1940-10-15 Admit Date: 10/20/2016 Admitting Physician Vianne Bulls, MD QJJ:[id number] Maudie Mercury, MD  Brief Narrative:  Patient is a 76 y.o. female with history of anxiety or depression, history of right breast cancer status post lumpectomy, admitted with several days history of subjective fevers, cough and worsening generalized weakness. She experienced lightheadedness, and a syncopal episode at home on 3/29. She was evaluated in the emergency room, found to have acute bronchitis secondary to influenza. She was admitted for further evaluation and treatment. See below for further details  Subjective:  Still with cough  Assessment/Plan:  Syncope:  -Orthostatic in the setting of bronchitis/influenza.  -TED stockings -increase activity, -home health PT upon discharge -echo: Left ventricle: The cavity size was normal. Wall thickness was   normal. Systolic function was normal. The estimated ejection   fraction was in the range of 60% to 65%. Wall motion was normal;   there were no regional wall motion abnormalities. Features are   consistent with a pseudonormal left ventricular filling pattern,   with concomitant abnormal relaxation and increased filling   pressure (grade 2 diastolic dysfunction).  Acute bronchitis: Likely triggered by influenza-she is wheezing. Also complains of significant nasal/sinus congestion.  -Anti-sinus therapy with Claritin, Flonase and Afrin nasal spray.  -Encourage incentive spirometry/flutter valve -wean steroids today and anticipate d/c in AM  Influenza: Continue Tamiflu-see above-follow clinical course.  Pancytopenia: Suspect related to influenza. Plans are to follow CBC closely, if no improvement-will then consider further workup/hematology consultation as an outpateint  Acute kidney injury on chronic kidney disease stage III: Acute  kidney injury likely mild prerenal azotemia in a setting of influenza. Improving with supportive measures. Follow electrolytes periodically.  History of right-sided breast cancer: Status post lumpectomy-on tamoxifen.  Depression/anxiety: Slightly more anxious due to ongoing influenza infection. Continue with trazodone and as needed Klonopin.  Generalized weakness: Suspect secondary to acute illness, PT evaluation done, will advise for [facility] PD which has been ordered.    DVT Prophylaxis:  SCD's-Given thrombocytopenia  Code Status: Full code   Family Communication: None at bedside  Disposition Plan: Plan to d/c in AM  Antimicrobial agents: Anti-infectives    Start     Dose/Rate Route Frequency Ordered Stop   10/22/16 2200  oseltamivir (TAMIFLU) capsule 30 mg  Status:  Discontinued     30 mg Oral 2 times daily 10/22/16 1248 10/22/16 1249   10/22/16 2200  oseltamivir (TAMIFLU) capsule 30 mg     30 mg Oral 2 times daily 10/22/16 1249 10/26/16 0959   10/21/16 0115  oseltamivir (TAMIFLU) capsule 30 mg  Status:  Discontinued     30 mg Oral Daily 10/21/16 0051 10/22/16 1248      Procedures: None  CONSULTS:  None  Time spent: 25- minutes-Greater than 50% of this time was spent in counseling, explanation of diagnosis, planning of further management, and coordination of care.  MEDICATIONS: Scheduled Meds: . cholecalciferol  1,000 Units Oral Daily  . fluticasone  1 spray Each Nare Daily  . gabapentin  300 mg Oral QHS  . guaiFENesin  1,200 mg Oral BID  . latanoprost  1 drop Both Eyes q morning - 10a  . loratadine  10 mg Oral Daily  . oseltamivir  30  mg Oral BID  . oxymetazoline  1 spray Each Nare BID  . pantoprazole  40 mg Oral Daily  . polyethylene glycol  17 g Oral Daily  . [START ON 10/24/2016] predniSONE  50 mg Oral Q breakfast  . sodium chloride flush  3 mL Intravenous Q12H  . tamoxifen  20 mg Oral Daily  . traZODone  250 mg Oral QHS   Continuous Infusions: PRN  Meds:.acetaminophen **OR** [DISCONTINUED] acetaminophen, benzonatate, bisacodyl, clonazePAM, HYDROcodone-acetaminophen, ipratropium-albuterol, ondansetron **OR** ondansetron (ZOFRAN) IV   PHYSICAL EXAM: Vital signs: Vitals:   10/22/16 2351 10/23/16 0000 10/23/16 0206 10/23/16 0647  BP: (!) 171/72 (!) 153/77 (!) 156/83 (!) 163/94  Pulse: 75  73 76  Resp:   18 18  Temp:   97.9 F (36.6 C) 97.7 F (36.5 C)  TempSrc:    Oral  SpO2:   96% 95%  Weight:      Height:       Filed Weights   10/20/16 1550 10/20/16 2240 10/22/16 0539  Weight: 66.7 kg (147 lb) 63 kg (138 lb 14.4 oz) 63.4 kg (139 lb 12.8 oz)   Body mass index is 26.41 kg/m.   General appearance :Awake, alert, not in any distress + productive cough Resp:Good air entry bilaterally, mild expiratory wheezing CVS: S1 S2 regular, no murmurs.  GI: Bowel sounds present, Non tender and not distended with no gaurding, rigidity or rebound.No organomegaly Extremities: B/L Lower Ext shows no edema, both legs are warm to touch Neurology:  speech clear,Non focal, sensation is grossly intact.   I have personally reviewed following labs and imaging studies  LABORATORY DATA: CBC:  Recent Labs Lab 10/20/16 1647 10/21/16 0559 10/22/16 0406  WBC 2.8* 2.2* 2.6*  NEUTROABS 1.8 0.9* 2.0  HGB 12.1 10.7* 12.2  HCT 35.9* 32.6* 36.8  MCV 87.8 88.1 88.2  PLT 65* 54* 70*    Basic Metabolic Panel:  Recent Labs Lab 10/20/16 1553 10/21/16 0559 10/22/16 0406  NA 137 137 138  K 4.3 3.7 4.2  CL 103 106 107  CO2 23 24 20*  GLUCOSE 126* 93 268*  BUN 17 16 16   CREATININE 1.88* 1.37* 1.31*  CALCIUM 8.4* 7.5* 8.5*    GFR: Estimated Creatinine Clearance: 31.1 mL/min (A) (by C-G formula based on SCr of 1.31 mg/dL (H)).  Liver Function Tests: No results for input(s): AST, ALT, ALKPHOS, BILITOT, PROT, ALBUMIN in the last 168 hours. No results for input(s): LIPASE, AMYLASE in the last 168 hours. No results for input(s): AMMONIA in the  last 168 hours.  Coagulation Profile: No results for input(s): INR, PROTIME in the last 168 hours.  Cardiac Enzymes: No results for input(s): CKTOTAL, CKMB, CKMBINDEX, TROPONINI in the last 168 hours.  BNP (last 3 results) No results for input(s): PROBNP in the last 8760 hours.  HbA1C: No results for input(s): HGBA1C in the last 72 hours.  CBG:  Recent Labs Lab 10/21/16 0828 10/22/16 0612 10/22/16 0742 10/23/16 0647 10/23/16 0819  GLUCAP 82 222* 188* 152* 147*    Lipid Profile: No results for input(s): CHOL, HDL, LDLCALC, TRIG, CHOLHDL, LDLDIRECT in the last 72 hours.  Thyroid Function Tests:  Recent Labs  10/20/16 2234  TSH 1.673    Anemia Panel:  Recent Labs  10/20/16 2234  VITAMINB12 1,457*  FOLATE 28.3  FERRITIN 616*  TIBC 253  IRON 25*  RETICCTPCT 0.4    Urine analysis:    Component Value Date/Time   COLORURINE YELLOW 10/20/2016 1915   APPEARANCEUR HAZY (  A) 10/20/2016 1915   LABSPEC 1.018 10/20/2016 1915   PHURINE 5.0 10/20/2016 1915   GLUCOSEU NEGATIVE 10/20/2016 1915   HGBUR SMALL (A) 10/20/2016 [place] NEGATIVE 10/20/2016 [place] 10/20/2016 1915   PROTEINUR 30 (A) 10/20/2016 1915   UROBILINOGEN 0.2 05/30/2008 2222   NITRITE NEGATIVE 10/20/2016 1915   LEUKOCYTESUR NEGATIVE 10/20/2016 1915    Sepsis Labs: Lactic Acid, Venous    Component Value Date/Time   LATICACIDVEN 0.70 10/20/2016 1955    MICROBIOLOGY: No results found for this or any previous visit (from the past 240 hour(s)).  RADIOLOGY STUDIES/RESULTS: Dg Chest Port 1 View  Result Date: 10/20/2016 CLINICAL DATA:  Cough and fever.  History of breast carcinoma EXAM: PORTABLE CHEST 1 VIEW COMPARISON:  October 29, 2015. FINDINGS: There is no edema or consolidation. The heart size and pulmonary vascularity are normal. No adenopathy. There is atherosclerotic calcification in the aorta. There are surgical clips in the right axilla. No bone lesions evident.  IMPRESSION: No edema or consolidation.  There is aortic atherosclerosis. Electronically Signed   By: Lowella Grip III M.D.   On: 10/20/2016 16:50     LOS: 2 days   Signature  Tru Rana U Haddon Fyfe DO on 10/23/2016 at 11:44 AM  Between 7am to 7pm - Pager - [phone] ( page via [facility], text pages only, please mention full 10 digit call back number).  After 7pm go to [email or web address] - password [facility]

## 2016-10-23 NOTE — Progress Notes (Signed)
   10/23/16 0647  Orthostatic Lying   BP- Lying (!) 163/94  Pulse- Lying 76  Orthostatic Sitting  BP- Sitting 165/87  Pulse- Sitting 93  Orthostatic Standing at 0 minutes  BP- Standing at 0 minutes 156/77  Pulse- Standing at 0 minutes 102  Orthostatic Standing at 3 minutes  BP- Standing at 3 minutes 151/68  Pulse- Standing at 3 minutes 107

## 2016-10-24 LAB — GLUCOSE, CAPILLARY: Glucose-Capillary: 112 mg/dL — ABNORMAL HIGH (ref 65–99)

## 2016-10-24 MED ORDER — BENZONATATE 200 MG PO CAPS: 200.0000 mg | ORAL_CAPSULE | Freq: Three times a day (TID) | ORAL | 0 refills | Status: DC | PRN

## 2016-10-24 MED ORDER — OSELTAMIVIR PHOSPHATE 30 MG PO CAPS: 30.0000 mg | ORAL_CAPSULE | Freq: Two times a day (BID) | ORAL | 0 refills | Status: DC

## 2016-10-24 MED ORDER — GUAIFENESIN ER 600 MG PO TB12: 1200.0000 mg | ORAL_TABLET | Freq: Two times a day (BID) | ORAL | 0 refills | Status: DC

## 2016-10-24 MED ORDER — PREDNISONE 10 MG PO TABS: ORAL_TABLET | ORAL | 0 refills | Status: DC

## 2016-10-24 NOTE — Progress Notes (Signed)
qPhysical Therapy Treatment Patient Details Name: Kathryn Ortiz MRN: [id number] DOB: [birth year] Today's Date: 10/24/2016    History of Present Illness Patient is a 76 yo female admitted 10/20/16 with syncope, fevers, cough, and weakness.   Patient with acute bronchitis due to Influenza.     PMH:  anxiety, depression, breast CA    PT Comments    Pt performed gait and stair training in prep for d/c home.  Pt appears at baseline.  Remains to present with fatigue and SHOB.  HR 117bpm, 95% on RA.     Follow Up Recommendations  Home health PT;Supervision - Intermittent     Equipment Recommendations  None recommended by PT    Recommendations for Other Services       Precautions / Restrictions Precautions Precautions: Fall Restrictions Weight Bearing Restrictions: No    Mobility  Bed Mobility Overal bed mobility: Modified Independent             General bed mobility comments: Pt standing in room unassisted on arrival.    Transfers Overall transfer level: Modified independent Equipment used: None Transfers: Sit to/from Stand Sit to Stand: Modified independent (Device/Increase time)         General transfer comment: Transfer performed from commode with good technique.    Ambulation/Gait Ambulation/Gait assistance: Supervision Ambulation Distance (Feet): 200 Feet Assistive device: None Gait Pattern/deviations: Step-through pattern;Decreased stride length Gait velocity: decreased Gait velocity interpretation: Below normal speed for age/gender General Gait Details: Patient with slow, steady gait today.  Supervision for safety only.   Stairs Stairs: Yes   Stair Management: One rail Right;Step to pattern;Forwards Number of Stairs: 12 General stair comments: Pt required supervision for safety.  Cues for sequencing.  Required rest break at the top of the stairs.  SHOB.  DOE 2/4.  96% on RA.    Wheelchair Mobility    Modified Rankin (Stroke Patients Only)        Balance Overall balance assessment: Needs assistance Sitting-balance support: No upper extremity supported Sitting balance-Leahy Scale: Normal     Standing balance support: No upper extremity supported Standing balance-Leahy Scale: Good                              Cognition Arousal/Alertness: Awake/alert Behavior During Therapy: WFL for tasks assessed/performed Overall Cognitive Status: Within Functional Limits for tasks assessed                                        Exercises      General Comments        Pertinent Vitals/Pain Pain Assessment: No/denies pain    Home Living                      Prior Function            PT Goals (current goals can now be found in the care plan section) Acute Rehab PT Goals Patient Stated Goal: To get back to normal.  To return home. Potential to Achieve Goals: Good Progress towards PT goals: Progressing toward goals    Frequency    Min 3X/week      PT Plan Current plan remains appropriate    Co-evaluation             End of Session Equipment Utilized During Treatment: Gait belt Activity Tolerance: Patient  tolerated treatment well Patient left: with call bell/phone within reach;in chair Nurse Communication: Mobility status PT Visit Diagnosis: Unsteadiness on feet (R26.81);Other abnormalities of gait and mobility (R26.89);Muscle weakness (generalized) (M62.81)     Time: 4782-9562 PT Time Calculation (min) (ACUTE ONLY): 17 min  Charges:  $Gait Training: 8-22 mins                    G Codes:       Governor Rooks, PTA pager [phone]    Cristela Blue 10/24/2016, 10:37 AM

## 2016-10-24 NOTE — Consult Note (Signed)
[facility] CM Primary Care Navigator  10/24/2016  Kathryn Ortiz 12/27/1940 [id number]   Met with patient at the bedside to identify possible discharge needs. Patient reports "feeling dizzy, weak and unable to stand up" which led to this admission.  Patient endorses Dr. James Kim with [facility] as the primary care provider.    Patient shared using [facility] in  to obtain medications without any problem.   Patient mentioned managing her own medications at home and will start using her "pill box" regularly as stated.   Patient reports that husband ([employer] driver) used to transport her prior to his diagnosis of Stage 4 lung cancer. She states that daughter can possibly provide transportation to her doctors' appointments if she is off work, otherwise, will use [employer] transportation if needed per patient.  Patient's daughter will be assisting her some with care at home (since patient is also the caregiver for her sick husband) per patient.  [facility] list of personal care services provided as a resourcewhenever needed, with the understanding that she will pay out of the pocket for it.Patient was thankful for the resource list given.  Anticipated discharge plan is home with home health services per PT recommendation.  Patient voiced understanding to call primary care provider's office when she returns home, for a post discharge follow-up appointment within a week or sooner if needs arise. Patient letter (with PCP's contact number) was provided as a reminder.  Patient denies any health management needs or concerns at this time. [facility] care management contact information provided for future needs that she may have.  For additional questions please contact:   A. , BSN, RN-BC [facility] PRIMARY CARE Navigator Cell: [phone] 

## 2016-10-24 NOTE — Progress Notes (Signed)
NURSING PROGRESS NOTE  Kathryn Ortiz [id number] Discharge Data: 10/24/2016 2:38 PM Attending Provider: Geradine Girt, DO QBH:ALPFX Maudie Mercury, MD     Raynelle Highland to be D/C'd Home per MD order.  Discussed with the patient the After Visit Summary and all questions fully answered. All IV's discontinued with no bleeding noted. All belongings returned to patient for patient to take home.   Last Vital Signs:  Blood pressure 121/78, pulse 78, temperature 98.5 F (36.9 C), resp. rate 18, height 5\' 1"  (1.549 m), weight 63.4 kg (139 lb 12.8 oz), SpO2 98 %.  Discharge Medication List Allergies as of 10/24/2016      Reactions   Penicillins Hives, Itching, Other (See Comments)   Welps, burning and makes patient sick. Pt. States she had problems breathing. Arms swelling. Tolerated Ancef pre-op 04/14/15   Celexa [citalopram] Other (See Comments)   Felt bad      Medication List    STOP taking these medications   THERAFLU COLD & COUGH 05-14-19 MG Pack Generic drug:  Phenylephrine-Pheniramine-DM     TAKE these medications   benzonatate 200 MG capsule Commonly known as:  TESSALON Take 1 capsule (200 mg total) by mouth 3 (three) times daily as needed for cough.   clonazePAM 1 MG tablet Commonly known as:  KLONOPIN Take 0.5 mg by mouth daily as needed for anxiety.   gabapentin 300 MG capsule Commonly known as:  NEURONTIN Take 300 mg by mouth at bedtime.   guaiFENesin 600 MG 12 hr tablet Commonly known as:  MUCINEX Take 2 tablets (1,200 mg total) by mouth 2 (two) times daily.   HYDROcodone-acetaminophen 7.5-325 MG tablet Commonly known as:  NORCO Take 1 tablet by mouth 2 (two) times daily.   latanoprost 0.005 % ophthalmic solution Commonly known as:  XALATAN Place 1 drop into both eyes every morning.   lovastatin 10 MG tablet Commonly known as:  MEVACOR Take 10 mg by mouth 2 (two) times a week. Reported on 12/16/2015   omeprazole 20 MG capsule Commonly known as:  PRILOSEC Take 20 mg by  mouth daily.   oseltamivir 30 MG capsule Commonly known as:  TAMIFLU Take 1 capsule (30 mg total) by mouth 2 (two) times daily.   predniSONE 10 MG tablet Commonly known as:  DELTASONE 40 mg x 2 days then 30 mg x 2 days then 20 mg x 2 day then 10 mg x 2 days and stop   tamoxifen 20 MG tablet Commonly known as:  NOLVADEX Take 1 tablet (20 mg total) by mouth daily. What changed:  when to take this   traZODone 100 MG tablet Commonly known as:  DESYREL Take 250 mg by mouth at bedtime. Reported on 12/16/2015   VITAMIN B12 PO Take 1 tablet by mouth daily. Reported on 10/22/2015   VITAMIN D PO Take 1 tablet by mouth daily.

## 2016-10-24 NOTE — Discharge Summary (Signed)
Physician Discharge Summary  Kathryn Ortiz YQM:[id number] DOB: 1940/09/12 DOA: 10/20/2016  PCP: Jani Gravel, MD  Admit date: 10/20/2016 Discharge date: 10/24/2016   Recommendations for Outpatient Follow-Up:   1. Outpatient CBC 1 week   Discharge Diagnosis:   Principal Problem:   Syncope Active Problems:   GERD (gastroesophageal reflux disease)   Hypertension   Anxiety   Depression   Breast cancer of upper-inner quadrant of right female breast (HCC)   Leukopenia   Thrombocytopenia (HCC)   AKI (acute kidney injury) ([place])   CKD (chronic kidney disease), stage III   Influenza-like illness   Influenza B   Discharge disposition:  Home  Discharge Condition: Improved.  Diet recommendation: Low sodium, heart healthy.  Carbohydrate-modified.  Wound care: None.   History of Present Illness:   Kathryn Ortiz is a 76 y.o. female with medical history significant for cancer of the right breast status post lumpectomy, depression with anxiety, chronic kidney disease stage III, and GERD who presents the emergency department with progressive generalized weakness, cough, subjective fevers, lightheadedness, and a syncopal episode at home last night. Patient reports that her current illness began approximately 1 week ago with general malaise, sore throat, rhinorrhea, and cough. Over the ensuing days, she has developed subjective fevers and generalized weakness. She becomes lightheaded while up and active about the house and reports a transient loss of consciousness last night. She had been up and walking through the house when she became acutely lightheaded, felt dizzy though she needed to sit down, but was unable to before she lost consciousness and slipped to the floor. She denies any head strike or significant injury resulting from this and regained awareness quickly. She denies chest pain or palpitations and denies lower extremity swelling or tenderness. She reports her cough is only occasionally  productive of scant white sputum. She has not attempted any interventions for her symptoms prior to coming in.    Hospital Course by Problem:   Syncope:  -Orthostatic in the setting of bronchitis/influenza.  -TED stockings -increase activity, -home health PT upon discharge -echo: Left ventricle: The cavity size was normal. Wall thickness was normal. Systolic function was normal. The estimated ejection fraction was in the range of 60% to 65%. Wall motion was normal; there were no regional wall motion abnormalities. Features are consistent with a pseudonormal left ventricular filling pattern, with concomitant abnormal relaxation and increased filling pressure (grade 2 diastolic dysfunction).  Acute bronchitis: Likely triggered by influenza-she is wheezing. Also complains of significant nasal/sinus congestion.  -Encourage incentive spirometry/flutter valve -wean steroids to off  Influenza: Continue Tamiflu-see above-follow clinical course.  Pancytopenia: Suspect related to influenza. Plans are to follow CBC closely-- consider further workup/hematology consultation as an outpateint  Acute kidney injury on chronic kidney disease stage III: Acute kidney injury likely mild prerenal azotemia in a setting of influenza. Improving with supportive measures  History of right-sided breast cancer: Status post lumpectomy-on tamoxifen.  Depression/anxiety: Slightly more anxious due to ongoing influenza infection. Continue with trazodone and as needed Klonopin.  Generalized weakness: Suspect secondary to acute illness, PT evaluation done, will advise for [facility] PD which has been ordered.      Medical Consultants:    None.   Discharge Exam:   Vitals:   10/23/16 2258 10/24/16 0539  BP: (!) 161/69 121/78  Pulse: 75 78  Resp: 18 18  Temp: 98.5 F (36.9 C)    Vitals:   10/23/16 0647 10/23/16 1321 10/23/16 2258 10/24/16 0539  BP: Marland Kitchen)  163/94 (!) 166/79 (!) 161/69 121/78    Pulse: 76 77 75 78  Resp: 18 20 18 18   Temp: 97.7 F (36.5 C) 98.1 F (36.7 C) 98.5 F (36.9 C)   TempSrc: Oral     SpO2: 95% 95% 97% 98%  Weight:      Height:        Gen:  NAD- still with cough but overall better   The results of significant diagnostics from this hospitalization (including imaging, microbiology, ancillary and laboratory) are listed below for reference.     Procedures and Diagnostic Studies:   Dg Chest Port 1 View  Result Date: 10/20/2016 CLINICAL DATA:  Cough and fever.  History of breast carcinoma EXAM: PORTABLE CHEST 1 VIEW COMPARISON:  October 29, 2015. FINDINGS: There is no edema or consolidation. The heart size and pulmonary vascularity are normal. No adenopathy. There is atherosclerotic calcification in the aorta. There are surgical clips in the right axilla. No bone lesions evident. IMPRESSION: No edema or consolidation.  There is aortic atherosclerosis. Electronically Signed   By: Lowella Grip III M.D.   On: 10/20/2016 16:50     Labs:   Basic Metabolic Panel:  Recent Labs Lab 10/20/16 1553 10/21/16 0559 10/22/16 0406  NA 137 137 138  K 4.3 3.7 4.2  CL 103 106 107  CO2 23 24 20*  GLUCOSE 126* 93 268*  BUN 17 16 16   CREATININE 1.88* 1.37* 1.31*  CALCIUM 8.4* 7.5* 8.5*   GFR Estimated Creatinine Clearance: 31.1 mL/min (A) (by C-G formula based on SCr of 1.31 mg/dL (H)). Liver Function Tests: No results for input(s): AST, ALT, ALKPHOS, BILITOT, PROT, ALBUMIN in the last 168 hours. No results for input(s): LIPASE, AMYLASE in the last 168 hours. No results for input(s): AMMONIA in the last 168 hours. Coagulation profile No results for input(s): INR, PROTIME in the last 168 hours.  CBC:  Recent Labs Lab 10/20/16 1647 10/21/16 0559 10/22/16 0406  WBC 2.8* 2.2* 2.6*  NEUTROABS 1.8 0.9* 2.0  HGB 12.1 10.7* 12.2  HCT 35.9* 32.6* 36.8  MCV 87.8 88.1 88.2  PLT 65* 54* 70*   Cardiac Enzymes: No results for input(s): CKTOTAL, CKMB,  CKMBINDEX, TROPONINI in the last 168 hours. BNP: Invalid input(s): POCBNP CBG:  Recent Labs Lab 10/22/16 0612 10/22/16 0742 10/23/16 0647 10/23/16 0819 10/24/16 0538  GLUCAP 222* 188* 152* 147* 112*   D-Dimer No results for input(s): DDIMER in the last 72 hours. Hgb A1c No results for input(s): HGBA1C in the last 72 hours. Lipid Profile No results for input(s): CHOL, HDL, LDLCALC, TRIG, CHOLHDL, LDLDIRECT in the last 72 hours. Thyroid function studies No results for input(s): TSH, T4TOTAL, T3FREE, THYROIDAB in the last 72 hours.  Invalid input(s): FREET3 Anemia work up No results for input(s): VITAMINB12, FOLATE, FERRITIN, TIBC, IRON, RETICCTPCT in the last 72 hours. Microbiology No results found for this or any previous visit (from the past 240 hour(s)).   Discharge Instructions:   Discharge Instructions    Diet - low sodium heart healthy    Complete by:  As directed    Discharge instructions    Complete by:  As directed    Home health CBC with PCP 1 week   Increase activity slowly    Complete by:  As directed      Allergies as of 10/24/2016      Reactions   Penicillins Hives, Itching, Other (See Comments)   Welps, burning and makes patient sick. Pt. States she  had problems breathing. Arms swelling. Tolerated Ancef pre-op 04/14/15   Celexa [citalopram] Other (See Comments)   Felt bad      Medication List    STOP taking these medications   THERAFLU COLD & COUGH 05-14-19 MG Pack Generic drug:  Phenylephrine-Pheniramine-DM     TAKE these medications   benzonatate 200 MG capsule Commonly known as:  TESSALON Take 1 capsule (200 mg total) by mouth 3 (three) times daily as needed for cough.   clonazePAM 1 MG tablet Commonly known as:  KLONOPIN Take 0.5 mg by mouth daily as needed for anxiety.   gabapentin 300 MG capsule Commonly known as:  NEURONTIN Take 300 mg by mouth at bedtime.   guaiFENesin 600 MG 12 hr tablet Commonly known as:  MUCINEX Take 2  tablets (1,200 mg total) by mouth 2 (two) times daily.   HYDROcodone-acetaminophen 7.5-325 MG tablet Commonly known as:  NORCO Take 1 tablet by mouth 2 (two) times daily.   latanoprost 0.005 % ophthalmic solution Commonly known as:  XALATAN Place 1 drop into both eyes every morning.   lovastatin 10 MG tablet Commonly known as:  MEVACOR Take 10 mg by mouth 2 (two) times a week. Reported on 12/16/2015   omeprazole 20 MG capsule Commonly known as:  PRILOSEC Take 20 mg by mouth daily.   oseltamivir 30 MG capsule Commonly known as:  TAMIFLU Take 1 capsule (30 mg total) by mouth 2 (two) times daily.   predniSONE 10 MG tablet Commonly known as:  DELTASONE 40 mg x 2 days then 30 mg x 2 days then 20 mg x 2 day then 10 mg x 2 days and stop   tamoxifen 20 MG tablet Commonly known as:  NOLVADEX Take 1 tablet (20 mg total) by mouth daily. What changed:  when to take this   traZODone 100 MG tablet Commonly known as:  DESYREL Take 250 mg by mouth at bedtime. Reported on 12/16/2015   VITAMIN B12 PO Take 1 tablet by mouth daily. Reported on 10/22/2015   VITAMIN D PO Take 1 tablet by mouth daily.         Time coordinating discharge: 35 min  Signed:  Machell Wirthlin U Recardo Linn   [facility] 10/24/2016, 8:59 AM

## 2016-10-27 DIAGNOSIS — Z96651 Presence of right artificial knee joint: Secondary | ICD-10-CM | POA: Diagnosis not present

## 2016-10-27 DIAGNOSIS — I129 Hypertensive chronic kidney disease with stage 1 through stage 4 chronic kidney disease, or unspecified chronic kidney disease: Secondary | ICD-10-CM | POA: Diagnosis not present

## 2016-10-27 DIAGNOSIS — Z853 Personal history of malignant neoplasm of breast: Secondary | ICD-10-CM | POA: Diagnosis not present

## 2016-10-27 DIAGNOSIS — R55 Syncope and collapse: Secondary | ICD-10-CM | POA: Diagnosis not present

## 2016-10-27 DIAGNOSIS — N183 Chronic kidney disease, stage 3 (moderate): Secondary | ICD-10-CM | POA: Diagnosis not present

## 2016-10-27 DIAGNOSIS — J209 Acute bronchitis, unspecified: Secondary | ICD-10-CM | POA: Diagnosis not present

## 2016-10-31 ENCOUNTER — Other Ambulatory Visit: Payer: Self-pay | Admitting: Hematology and Oncology

## 2016-11-02 DIAGNOSIS — J209 Acute bronchitis, unspecified: Secondary | ICD-10-CM | POA: Diagnosis not present

## 2016-11-02 DIAGNOSIS — I129 Hypertensive chronic kidney disease with stage 1 through stage 4 chronic kidney disease, or unspecified chronic kidney disease: Secondary | ICD-10-CM | POA: Diagnosis not present

## 2016-11-02 DIAGNOSIS — N183 Chronic kidney disease, stage 3 (moderate): Secondary | ICD-10-CM | POA: Diagnosis not present

## 2016-11-02 DIAGNOSIS — Z96651 Presence of right artificial knee joint: Secondary | ICD-10-CM | POA: Diagnosis not present

## 2016-11-02 DIAGNOSIS — Z853 Personal history of malignant neoplasm of breast: Secondary | ICD-10-CM | POA: Diagnosis not present

## 2016-11-02 DIAGNOSIS — R55 Syncope and collapse: Secondary | ICD-10-CM | POA: Diagnosis not present

## 2016-11-04 DIAGNOSIS — R55 Syncope and collapse: Secondary | ICD-10-CM | POA: Diagnosis not present

## 2016-11-04 DIAGNOSIS — Z853 Personal history of malignant neoplasm of breast: Secondary | ICD-10-CM | POA: Diagnosis not present

## 2016-11-04 DIAGNOSIS — N183 Chronic kidney disease, stage 3 (moderate): Secondary | ICD-10-CM | POA: Diagnosis not present

## 2016-11-04 DIAGNOSIS — Z96651 Presence of right artificial knee joint: Secondary | ICD-10-CM | POA: Diagnosis not present

## 2016-11-04 DIAGNOSIS — I129 Hypertensive chronic kidney disease with stage 1 through stage 4 chronic kidney disease, or unspecified chronic kidney disease: Secondary | ICD-10-CM | POA: Diagnosis not present

## 2016-11-04 DIAGNOSIS — J209 Acute bronchitis, unspecified: Secondary | ICD-10-CM | POA: Diagnosis not present

## 2016-11-08 DIAGNOSIS — N183 Chronic kidney disease, stage 3 (moderate): Secondary | ICD-10-CM | POA: Diagnosis not present

## 2016-11-08 DIAGNOSIS — I129 Hypertensive chronic kidney disease with stage 1 through stage 4 chronic kidney disease, or unspecified chronic kidney disease: Secondary | ICD-10-CM | POA: Diagnosis not present

## 2016-11-08 DIAGNOSIS — J209 Acute bronchitis, unspecified: Secondary | ICD-10-CM | POA: Diagnosis not present

## 2016-11-08 DIAGNOSIS — Z853 Personal history of malignant neoplasm of breast: Secondary | ICD-10-CM | POA: Diagnosis not present

## 2016-11-08 DIAGNOSIS — R55 Syncope and collapse: Secondary | ICD-10-CM | POA: Diagnosis not present

## 2016-11-08 DIAGNOSIS — Z96651 Presence of right artificial knee joint: Secondary | ICD-10-CM | POA: Diagnosis not present

## 2016-11-09 DIAGNOSIS — R55 Syncope and collapse: Secondary | ICD-10-CM | POA: Diagnosis not present

## 2016-11-09 DIAGNOSIS — R05 Cough: Secondary | ICD-10-CM | POA: Diagnosis not present

## 2016-11-09 DIAGNOSIS — M25561 Pain in right knee: Secondary | ICD-10-CM | POA: Diagnosis not present

## 2016-11-09 DIAGNOSIS — I10 Essential (primary) hypertension: Secondary | ICD-10-CM | POA: Diagnosis not present

## 2016-11-11 DIAGNOSIS — Z853 Personal history of malignant neoplasm of breast: Secondary | ICD-10-CM | POA: Diagnosis not present

## 2016-11-11 DIAGNOSIS — J209 Acute bronchitis, unspecified: Secondary | ICD-10-CM | POA: Diagnosis not present

## 2016-11-11 DIAGNOSIS — Z96651 Presence of right artificial knee joint: Secondary | ICD-10-CM | POA: Diagnosis not present

## 2016-11-11 DIAGNOSIS — I129 Hypertensive chronic kidney disease with stage 1 through stage 4 chronic kidney disease, or unspecified chronic kidney disease: Secondary | ICD-10-CM | POA: Diagnosis not present

## 2016-11-11 DIAGNOSIS — N183 Chronic kidney disease, stage 3 (moderate): Secondary | ICD-10-CM | POA: Diagnosis not present

## 2016-11-11 DIAGNOSIS — R55 Syncope and collapse: Secondary | ICD-10-CM | POA: Diagnosis not present

## 2016-11-16 ENCOUNTER — Other Ambulatory Visit: Payer: Self-pay | Admitting: Pharmacist

## 2016-11-16 NOTE — Patient Outreach (Signed)
[facility]) Care Management  [place]   11/16/2016  LENORA GOMES Mar 11, 1941 [id number]  Subjective: Patient is a 76 year old female flagged by [facility] system for a medication reconciliation post discharge.    On 10/21/2016 admitted for syncope (soft BP) and AKI on CKD stage III (creatinine was 1.88 at admission). Suspected orthostatic syncope. Patient presented with pancytopenia (WBC 2800, platelets 65,000 on admission) and flu-like symptoms. Found positive influenza B and acute bronchitis, triggered by influenza - patient given IV fluids, Tamiflu. Patient discharged 10/24/2016, with two days left of Tamiflu and otherwise stable, recommended CBC within one week from discharge.    Patient states she manages her own medications with the help of a pill box. Her daughter helps, however, she mostly manages her own medications.    Patient endorses no falls or unexpected dizziness since her last admission, however, she does say she has "spells of weakness" and her doctor told her she should be taking Ensure protein plus daily to help her build her strength. She states she has been having trouble affording her Ensure and would like some financial assistance.  Patient was recently discharged from hospital and all medications have been reviewed.  Objective:  Encounter Medications: Outpatient Encounter Prescriptions as of 11/16/2016  Medication Sig  . Cholecalciferol (VITAMIN D PO) Take 1 tablet by mouth daily.  . clonazePAM (KLONOPIN) 1 MG tablet Take 0.5 mg by mouth daily as needed for anxiety.  . Cyanocobalamin (VITAMIN B12 PO) Take 1 tablet by mouth daily. Reported on 10/22/2015  . gabapentin (NEURONTIN) 300 MG capsule Take 300 mg by mouth at bedtime.  Marland Kitchen HYDROcodone-acetaminophen (NORCO) 7.5-325 MG tablet Take 1 tablet by mouth 2 (two) times daily.  Marland Kitchen latanoprost (XALATAN) 0.005 % ophthalmic solution Place 1 drop into both eyes every morning.  . lovastatin  (MEVACOR) 10 MG tablet Take 10 mg by mouth 2 (two) times a week. Reported on 12/16/2015  . omeprazole (PRILOSEC) 20 MG capsule Take 20 mg by mouth daily.  . tamoxifen (NOLVADEX) 20 MG tablet TAKE 1 TABLET BY MOUTH  DAILY  . traZODone (DESYREL) 100 MG tablet Take 250 mg by mouth at bedtime. Reported on 12/16/2015  . [DISCONTINUED] benzonatate (TESSALON) 200 MG capsule Take 1 capsule (200 mg total) by mouth 3 (three) times daily as needed for cough.  . [DISCONTINUED] guaiFENesin (MUCINEX) 600 MG 12 hr tablet Take 2 tablets (1,200 mg total) by mouth 2 (two) times daily.  . [DISCONTINUED] oseltamivir (TAMIFLU) 30 MG capsule Take 1 capsule (30 mg total) by mouth 2 (two) times daily.  . [DISCONTINUED] predniSONE (DELTASONE) 10 MG tablet 40 mg x 2 days then 30 mg x 2 days then 20 mg x 2 day then 10 mg x 2 days and stop   No facility-administered encounter medications on file as of 11/16/2016.     Functional Status: In your present state of health, do you have any difficulty performing the following activities: 10/22/2016  Hearing? N  Vision? N  Difficulty concentrating or making decisions? N  Walking or climbing stairs? Y  Dressing or bathing? N  Doing errands, shopping? N  Some recent data might be hidden    Fall/Depression Screening: Fall Risk  12/16/2015 11/19/2015 10/26/2015  Falls in the past year? Yes Yes Yes  Number falls in past yr: 2 or more 1 1  Injury with Fall? No No No  Risk for fall due to : History of fall(s) Impaired vision (No Data)  Risk for fall due to (comments): - wears glasses fainted  Follow up Falls evaluation completed Falls evaluation completed -   PHQ 2/9 Scores 12/16/2015 11/19/2015 11/02/2015 10/22/2015  PHQ - 2 Score 1 1 2  0  PHQ- 9 Score - 5 21 -   Assessment: Drugs sorted by system: Cardiovascular: Lovastatin   Neurologic/Psychologic: Clonazepam, trazodone   Endocrine: Tamoxifen   Gastrointestinal: Omeprazole  Topical/ophthalmic: latanoprost  Pain:  Gabapentin, Norco   Vitamins/Minerals: Vitamin D, Vitamin I37     Duplications in therapy: none   Medations to avoid in the elderly: none on med list   Drug interactions:  Norco and Clonazepam/Gabapentin: CNS depressants may enhance CNS depressant effect of Hydrocodone, Consider therapy modification if long term Norco use indicated    Trazodone-Norco: Opioid analgesics may enhance the serotonergic effect of serotonin modulators, monitor patient for any signs or symptoms of serotonin syndrome   Other issues noted:  Marland Kitchen Patient states she has trouble affording two of her medications on a monthly basis (hydrocodone/acetaminophen and clonazepam).  . Patient states that doctor wants her to take Ensure every day, however, she has trouble affording the supplement.   Plan: -Will contact Dr Maudie Mercury to Consider Shingrix vaccination at next outpatient visit if not already received and to Consider Ca2+ supplementation with Vitamin D to improve bone strength and protect against fracture -Unfortunately there are limited medication assistance options for Norco, clonazepam, and Ensure dietary supplement.  -If possible orthostatic syncope "spells of weakness" persists post-discharge, consider tapering benzodiazepine   Bennye Alm, PharmD, [place] PGY2 Pharmacy Resident [phone] Patient was called with Grayling Congress, PharmD Candidate

## 2016-11-25 DIAGNOSIS — R55 Syncope and collapse: Secondary | ICD-10-CM | POA: Diagnosis not present

## 2016-11-25 DIAGNOSIS — I129 Hypertensive chronic kidney disease with stage 1 through stage 4 chronic kidney disease, or unspecified chronic kidney disease: Secondary | ICD-10-CM | POA: Diagnosis not present

## 2016-11-25 DIAGNOSIS — N183 Chronic kidney disease, stage 3 (moderate): Secondary | ICD-10-CM | POA: Diagnosis not present

## 2016-11-25 DIAGNOSIS — J209 Acute bronchitis, unspecified: Secondary | ICD-10-CM | POA: Diagnosis not present

## 2016-11-25 DIAGNOSIS — Z853 Personal history of malignant neoplasm of breast: Secondary | ICD-10-CM | POA: Diagnosis not present

## 2016-11-25 DIAGNOSIS — Z96651 Presence of right artificial knee joint: Secondary | ICD-10-CM | POA: Diagnosis not present

## 2016-12-29 DIAGNOSIS — Z23 Encounter for immunization: Secondary | ICD-10-CM | POA: Diagnosis not present

## 2016-12-29 DIAGNOSIS — R42 Dizziness and giddiness: Secondary | ICD-10-CM | POA: Diagnosis not present

## 2016-12-29 DIAGNOSIS — Z0000 Encounter for general adult medical examination without abnormal findings: Secondary | ICD-10-CM | POA: Diagnosis not present

## 2016-12-29 DIAGNOSIS — R5383 Other fatigue: Secondary | ICD-10-CM | POA: Diagnosis not present

## 2017-01-03 ENCOUNTER — Other Ambulatory Visit: Payer: Self-pay | Admitting: Internal Medicine

## 2017-01-03 DIAGNOSIS — R42 Dizziness and giddiness: Secondary | ICD-10-CM

## 2017-01-06 ENCOUNTER — Ambulatory Visit
Admission: RE | Admit: 2017-01-06 | Discharge: 2017-01-06 | Disposition: A | Discharge status: Home / Self Care | Payer: Medicare Other | Source: Ambulatory Visit | Attending: Hematology and Oncology | Admitting: Hematology and Oncology

## 2017-01-06 DIAGNOSIS — Z170 Estrogen receptor positive status [ER+]: Principal | ICD-10-CM

## 2017-01-06 DIAGNOSIS — C50211 Malignant neoplasm of upper-inner quadrant of right female breast: Secondary | ICD-10-CM

## 2017-01-06 DIAGNOSIS — R928 Other abnormal and inconclusive findings on diagnostic imaging of breast: Secondary | ICD-10-CM | POA: Diagnosis not present

## 2017-01-16 DIAGNOSIS — N390 Urinary tract infection, site not specified: Secondary | ICD-10-CM | POA: Diagnosis not present

## 2017-01-16 DIAGNOSIS — I10 Essential (primary) hypertension: Secondary | ICD-10-CM | POA: Diagnosis not present

## 2017-01-16 DIAGNOSIS — M25562 Pain in left knee: Secondary | ICD-10-CM | POA: Diagnosis not present

## 2017-01-16 DIAGNOSIS — B379 Candidiasis, unspecified: Secondary | ICD-10-CM | POA: Diagnosis not present

## 2017-01-16 DIAGNOSIS — Z01419 Encounter for gynecological examination (general) (routine) without abnormal findings: Secondary | ICD-10-CM | POA: Diagnosis not present

## 2017-01-16 DIAGNOSIS — M859 Disorder of bone density and structure, unspecified: Secondary | ICD-10-CM | POA: Diagnosis not present

## 2017-01-16 DIAGNOSIS — R739 Hyperglycemia, unspecified: Secondary | ICD-10-CM | POA: Diagnosis not present

## 2017-01-16 DIAGNOSIS — Z5181 Encounter for therapeutic drug level monitoring: Secondary | ICD-10-CM | POA: Diagnosis not present

## 2017-02-07 ENCOUNTER — Encounter (HOSPITAL_COMMUNITY): Payer: Self-pay

## 2017-02-07 ENCOUNTER — Observation Stay (HOSPITAL_COMMUNITY)
Admission: EM | Admit: 2017-02-07 | Discharge: 2017-02-09 | Disposition: A | Discharge status: Home / Self Care | Payer: Medicare Other | Attending: Internal Medicine | Admitting: Internal Medicine

## 2017-02-07 ENCOUNTER — Observation Stay (HOSPITAL_COMMUNITY): Payer: Medicare Other

## 2017-02-07 ENCOUNTER — Other Ambulatory Visit: Payer: Self-pay

## 2017-02-07 ENCOUNTER — Emergency Department (HOSPITAL_COMMUNITY): Payer: Medicare Other

## 2017-02-07 DIAGNOSIS — I639 Cerebral infarction, unspecified: Secondary | ICD-10-CM

## 2017-02-07 DIAGNOSIS — E785 Hyperlipidemia, unspecified: Secondary | ICD-10-CM | POA: Diagnosis not present

## 2017-02-07 DIAGNOSIS — F329 Major depressive disorder, single episode, unspecified: Secondary | ICD-10-CM | POA: Diagnosis not present

## 2017-02-07 DIAGNOSIS — Z79899 Other long term (current) drug therapy: Secondary | ICD-10-CM | POA: Insufficient documentation

## 2017-02-07 DIAGNOSIS — N183 Chronic kidney disease, stage 3 unspecified: Secondary | ICD-10-CM | POA: Diagnosis present

## 2017-02-07 DIAGNOSIS — I071 Rheumatic tricuspid insufficiency: Secondary | ICD-10-CM | POA: Insufficient documentation

## 2017-02-07 DIAGNOSIS — F419 Anxiety disorder, unspecified: Secondary | ICD-10-CM

## 2017-02-07 DIAGNOSIS — I129 Hypertensive chronic kidney disease with stage 1 through stage 4 chronic kidney disease, or unspecified chronic kidney disease: Secondary | ICD-10-CM | POA: Insufficient documentation

## 2017-02-07 DIAGNOSIS — G459 Transient cerebral ischemic attack, unspecified: Secondary | ICD-10-CM

## 2017-02-07 DIAGNOSIS — Z853 Personal history of malignant neoplasm of breast: Secondary | ICD-10-CM | POA: Insufficient documentation

## 2017-02-07 DIAGNOSIS — R200 Anesthesia of skin: Principal | ICD-10-CM | POA: Insufficient documentation

## 2017-02-07 DIAGNOSIS — Z96651 Presence of right artificial knee joint: Secondary | ICD-10-CM | POA: Insufficient documentation

## 2017-02-07 DIAGNOSIS — K219 Gastro-esophageal reflux disease without esophagitis: Secondary | ICD-10-CM | POA: Diagnosis not present

## 2017-02-07 DIAGNOSIS — Z720 Tobacco use: Secondary | ICD-10-CM | POA: Diagnosis not present

## 2017-02-07 DIAGNOSIS — Z7982 Long term (current) use of aspirin: Secondary | ICD-10-CM | POA: Diagnosis not present

## 2017-02-07 DIAGNOSIS — H532 Diplopia: Secondary | ICD-10-CM | POA: Insufficient documentation

## 2017-02-07 DIAGNOSIS — D320 Benign neoplasm of cerebral meninges: Secondary | ICD-10-CM | POA: Diagnosis not present

## 2017-02-07 DIAGNOSIS — D329 Benign neoplasm of meninges, unspecified: Secondary | ICD-10-CM | POA: Diagnosis not present

## 2017-02-07 DIAGNOSIS — G4700 Insomnia, unspecified: Secondary | ICD-10-CM | POA: Diagnosis not present

## 2017-02-07 DIAGNOSIS — R29810 Facial weakness: Secondary | ICD-10-CM | POA: Insufficient documentation

## 2017-02-07 DIAGNOSIS — Z880 Allergy status to penicillin: Secondary | ICD-10-CM | POA: Insufficient documentation

## 2017-02-07 DIAGNOSIS — R2990 Unspecified symptoms and signs involving the nervous system: Secondary | ICD-10-CM | POA: Diagnosis not present

## 2017-02-07 DIAGNOSIS — M1990 Unspecified osteoarthritis, unspecified site: Secondary | ICD-10-CM | POA: Diagnosis not present

## 2017-02-07 DIAGNOSIS — R29818 Other symptoms and signs involving the nervous system: Secondary | ICD-10-CM | POA: Diagnosis not present

## 2017-02-07 DIAGNOSIS — R4781 Slurred speech: Secondary | ICD-10-CM | POA: Diagnosis not present

## 2017-02-07 DIAGNOSIS — R531 Weakness: Secondary | ICD-10-CM | POA: Insufficient documentation

## 2017-02-07 DIAGNOSIS — Z888 Allergy status to other drugs, medicaments and biological substances status: Secondary | ICD-10-CM | POA: Diagnosis not present

## 2017-02-07 DIAGNOSIS — H353190 Nonexudative age-related macular degeneration, unspecified eye, stage unspecified: Secondary | ICD-10-CM | POA: Diagnosis not present

## 2017-02-07 DIAGNOSIS — C50211 Malignant neoplasm of upper-inner quadrant of right female breast: Secondary | ICD-10-CM | POA: Diagnosis present

## 2017-02-07 DIAGNOSIS — R202 Paresthesia of skin: Secondary | ICD-10-CM | POA: Diagnosis not present

## 2017-02-07 DIAGNOSIS — I6523 Occlusion and stenosis of bilateral carotid arteries: Secondary | ICD-10-CM | POA: Diagnosis not present

## 2017-02-07 DIAGNOSIS — I10 Essential (primary) hypertension: Secondary | ICD-10-CM | POA: Diagnosis present

## 2017-02-07 LAB — I-STAT CHEM 8, ED
BUN: 11 mg/dL (ref 6–20)
CALCIUM ION: 1.13 mmol/L — AB (ref 1.15–1.40)
CHLORIDE: 104 mmol/L (ref 101–111)
Creatinine, Ser: 1.3 mg/dL — ABNORMAL HIGH (ref 0.44–1.00)
Glucose, Bld: 103 mg/dL — ABNORMAL HIGH (ref 65–99)
HCT: 37 % (ref 36.0–46.0)
HEMOGLOBIN: 12.6 g/dL (ref 12.0–15.0)
Potassium: 4.2 mmol/L (ref 3.5–5.1)
SODIUM: 140 mmol/L (ref 135–145)
TCO2: 25 mmol/L (ref 0–100)

## 2017-02-07 LAB — LIPID PANEL
CHOLESTEROL: 163 mg/dL (ref 0–200)
HDL: 66 mg/dL (ref >40)
LDL CALC: 79 mg/dL (ref 0–99)
TRIGLYCERIDES: 90 mg/dL (ref <150)
Total CHOL/HDL Ratio: 2.5 RATIO
VLDL: 18 mg/dL (ref 0–40)

## 2017-02-07 LAB — CBC
HCT: 37.3 % (ref 36.0–46.0)
Hemoglobin: 12.1 g/dL (ref 12.0–15.0)
MCH: 29.5 pg (ref 26.0–34.0)
MCHC: 32.4 g/dL (ref 30.0–36.0)
MCV: 91 fL (ref 78.0–100.0)
PLATELETS: 145 10*3/uL — AB (ref 150–400)
RBC: 4.1 MIL/uL (ref 3.87–5.11)
RDW: 13.1 % (ref 11.5–15.5)
WBC: 4.2 10*3/uL (ref 4.0–10.5)

## 2017-02-07 LAB — DIFFERENTIAL
BASOS PCT: 1 %
Basophils Absolute: 0 10*3/uL (ref 0.0–0.1)
EOS PCT: 4 %
Eosinophils Absolute: 0.2 10*3/uL (ref 0.0–0.7)
LYMPHS PCT: 30 %
Lymphs Abs: 1.2 10*3/uL (ref 0.7–4.0)
MONO ABS: 0.5 10*3/uL (ref 0.1–1.0)
Monocytes Relative: 11 %
Neutro Abs: 2.3 10*3/uL (ref 1.7–7.7)
Neutrophils Relative %: 54 %

## 2017-02-07 LAB — COMPREHENSIVE METABOLIC PANEL
ALK PHOS: 43 U/L (ref 38–126)
ALT: 13 U/L — ABNORMAL LOW (ref 14–54)
ANION GAP: 8 (ref 5–15)
AST: 27 U/L (ref 15–41)
Albumin: 3.7 g/dL (ref 3.5–5.0)
BUN: 10 mg/dL (ref 6–20)
CALCIUM: 9 mg/dL (ref 8.9–10.3)
CO2: 24 mmol/L (ref 22–32)
Chloride: 106 mmol/L (ref 101–111)
Creatinine, Ser: 1.39 mg/dL — ABNORMAL HIGH (ref 0.44–1.00)
GFR calc non Af Amer: 36 mL/min — ABNORMAL LOW (ref >60)
GFR, EST AFRICAN AMERICAN: 42 mL/min — AB (ref >60)
Glucose, Bld: 110 mg/dL — ABNORMAL HIGH (ref 65–99)
Potassium: 4.4 mmol/L (ref 3.5–5.1)
SODIUM: 138 mmol/L (ref 135–145)
Total Bilirubin: 0.3 mg/dL (ref 0.3–1.2)
Total Protein: 6.1 g/dL — ABNORMAL LOW (ref 6.5–8.1)

## 2017-02-07 LAB — PROTIME-INR
INR: 1.01
PROTHROMBIN TIME: 13.4 s (ref 11.4–15.2)

## 2017-02-07 LAB — I-STAT TROPONIN, ED: Troponin i, poc: 0 ng/mL (ref 0.00–0.08)

## 2017-02-07 LAB — APTT: aPTT: 27 seconds (ref 24–36)

## 2017-02-07 MED ORDER — CLONAZEPAM 0.5 MG PO TABS
0.5000 mg | ORAL_TABLET | Freq: Every day | ORAL | Status: DC
  Administered 2017-02-07 – 2017-02-09 (×2): 0.5 mg via ORAL
  Filled 2017-02-07 (×3): qty 1

## 2017-02-07 MED ORDER — LORAZEPAM 2 MG/ML IJ SOLN
1.0000 mg | Freq: Once | INTRAMUSCULAR | Status: AC
  Administered 2017-02-07: 1 mg via INTRAVENOUS
  Filled 2017-02-07: qty 1

## 2017-02-07 MED ORDER — ASPIRIN 325 MG PO TABS
325.0000 mg | ORAL_TABLET | Freq: Once | ORAL | Status: AC
  Administered 2017-02-07: 325 mg via ORAL
  Filled 2017-02-07: qty 1

## 2017-02-07 MED ORDER — PANTOPRAZOLE SODIUM 40 MG PO TBEC
40.0000 mg | DELAYED_RELEASE_TABLET | Freq: Every day | ORAL | Status: DC
  Administered 2017-02-07 – 2017-02-09 (×3): 40 mg via ORAL
  Filled 2017-02-07 (×3): qty 1

## 2017-02-07 MED ORDER — ACETAMINOPHEN 325 MG PO TABS: 650.0000 mg | ORAL_TABLET | ORAL | Status: DC | PRN

## 2017-02-07 MED ORDER — HYDROCODONE-ACETAMINOPHEN 7.5-325 MG PO TABS
1.0000 | ORAL_TABLET | Freq: Two times a day (BID) | ORAL | Status: DC
  Administered 2017-02-08 – 2017-02-09 (×2): 1 via ORAL
  Filled 2017-02-07 (×4): qty 1

## 2017-02-07 MED ORDER — ENOXAPARIN SODIUM 40 MG/0.4ML ~~LOC~~ SOLN
40.0000 mg | SUBCUTANEOUS | Status: DC
  Administered 2017-02-07 – 2017-02-08 (×2): 40 mg via SUBCUTANEOUS
  Filled 2017-02-07 (×2): qty 0.4

## 2017-02-07 MED ORDER — GABAPENTIN 300 MG PO CAPS
300.0000 mg | ORAL_CAPSULE | Freq: Every day | ORAL | Status: DC
  Administered 2017-02-07 – 2017-02-08 (×2): 300 mg via ORAL
  Filled 2017-02-07 (×2): qty 1

## 2017-02-07 MED ORDER — TRAZODONE HCL 50 MG PO TABS
250.0000 mg | ORAL_TABLET | Freq: Every day | ORAL | Status: DC
  Administered 2017-02-07 – 2017-02-08 (×2): 250 mg via ORAL
  Filled 2017-02-07 (×3): qty 5

## 2017-02-07 MED ORDER — ASPIRIN 300 MG RE SUPP: 300.0000 mg | Freq: Once | RECTAL | Status: DC

## 2017-02-07 MED ORDER — TAMOXIFEN CITRATE 10 MG PO TABS
20.0000 mg | ORAL_TABLET | Freq: Every day | ORAL | Status: DC
  Administered 2017-02-07 – 2017-02-09 (×3): 20 mg via ORAL
  Filled 2017-02-07 (×3): qty 2

## 2017-02-07 MED ORDER — SODIUM CHLORIDE 0.9 % IV SOLN
INTRAVENOUS | Status: AC
  Administered 2017-02-07: 19:00:00 via INTRAVENOUS

## 2017-02-07 MED ORDER — ACETAMINOPHEN 650 MG RE SUPP: 650.0000 mg | RECTAL | Status: DC | PRN

## 2017-02-07 MED ORDER — LATANOPROST 0.005 % OP SOLN
1.0000 [drp] | Freq: Every morning | OPHTHALMIC | Status: DC
  Administered 2017-02-07 – 2017-02-08 (×2): 1 [drp] via OPHTHALMIC
  Filled 2017-02-07: qty 2.5

## 2017-02-07 MED ORDER — STROKE: EARLY STAGES OF RECOVERY BOOK
Freq: Once | Status: AC
  Administered 2017-02-07: 20:00:00
  Filled 2017-02-07: qty 1

## 2017-02-07 MED ORDER — PRAVASTATIN SODIUM 10 MG PO TABS
10.0000 mg | ORAL_TABLET | Freq: Every day | ORAL | Status: DC
  Administered 2017-02-07 – 2017-02-08 (×2): 10 mg via ORAL
  Filled 2017-02-07 (×3): qty 1

## 2017-02-07 MED ORDER — ACETAMINOPHEN 160 MG/5ML PO SOLN: 650.0000 mg | ORAL | Status: DC | PRN

## 2017-02-07 MED ORDER — SENNOSIDES-DOCUSATE SODIUM 8.6-50 MG PO TABS: 1.0000 | ORAL_TABLET | Freq: Every evening | ORAL | Status: DC | PRN

## 2017-02-07 NOTE — ED Notes (Signed)
Report attempted 

## 2017-02-07 NOTE — ED Provider Notes (Signed)
[facility] DEPT Provider Note   CSN: [id number] Arrival date & time: 02/07/17  1425   An emergency department physician performed an initial assessment on this suspected stroke patient at 1430.  History   Chief Complaint Chief Complaint  Patient presents with  . Code Stroke    HPI Kathryn Ortiz is a 76 y.o. female.  HPI   76yo female presents as code stroke after presenting with concern for left sided weakness and numbness. Reports she was at bible study at 11AM when she suddenly noticed left sided numbness and tingling of her left arm and left face. Reports no change in her left leg.  Reports she may have also developed weakness but did not "pay any attention." Denies change of vision, ambulation, no facial droop. Thought maybe speech was more "slurried" but doesn't feel that way any more.    Past Medical History:  Diagnosis Date  . Anxiety    takes Clonazepam daily as needed  . Arthritis   . Back pain    from knee pain  . Breast cancer ([place])    Right breast  . Depression    takes Celexa daily  . GERD (gastroesophageal reflux disease)    takes Omeprazole daily  . Hyperlipidemia    takes Lovastatin 2 times a week  . Hypertension    was on meds but has been off x 2 yr  . Insomnia    takes Trazodone nightly  . Joint pain   . Joint swelling   . Macular degeneration    dry   . Osteoarthritis of right knee, primary localized 04/14/2015  . S/P total knee arthroplasty    R  . Sciatica   . Stenosis of carotid artery 10-27-15   R & L  . Syncope 11-13-15  . Urinary frequency   . Weakness    tingling in hands and feet    Patient Active Problem List   Diagnosis Date Noted  . Stroke-like symptoms 02/07/2017  . Meningioma ([place])   . Influenza B   . Leukopenia 10/20/2016  . Thrombocytopenia ([place]) 10/20/2016  . AKI (acute kidney injury) ([place]) 10/20/2016  . CKD (chronic kidney disease), stage III 10/20/2016  . Influenza-like illness 10/20/2016  . Breast cancer of  upper-inner quadrant of right female breast ([place]) 08/10/2015  . Postoperative anemia due to acute blood loss 04/22/2015  . GERD (gastroesophageal reflux disease)   . Hyperlipidemia   . Hypertension   . Anxiety   . Depression   . S/P total knee arthroplasty   . Osteoarthritis of right knee, primary localized 04/14/2015  . Knee osteoarthritis 04/14/2015  . Syncope 02/23/2010    Past Surgical History:  Procedure Laterality Date  . BREAST EXCISIONAL BIOPSY     left   . BREAST LUMPECTOMY     October 01 2015 right  . BREAST LUMPECTOMY WITH RADIOACTIVE SEED AND SENTINEL LYMPH NODE BIOPSY Right 09/28/2015   Procedure: BREAST LUMPECTOMY WITH RADIOACTIVE SEED AND SENTINEL LYMPH NODE BIOPSY;  Surgeon: Autumn Messing III, MD;  Location: [facility];  Service: General;  Laterality: Right;  . BREAST SURGERY     breast biopsy  . cataract surgery Bilateral   . COLONOSCOPY    . epidural injections    . SHOULDER SURGERY Bilateral   . TOTAL KNEE ARTHROPLASTY Right 01/12/2015  . TOTAL KNEE ARTHROPLASTY Right 04/14/2015   Procedure: TOTAL KNEE ARTHROPLASTY;  Surgeon: Marchia Bond, MD;  Location: [facility];  Service: Orthopedics;  Laterality: Right;  OB History    No data available       Home Medications    Prior to Admission medications   Medication Sig Start Date End Date Taking? Authorizing Provider  Cholecalciferol (VITAMIN D PO) Take 1 tablet by mouth daily.   Yes [provider]  citalopram (CELEXA) 10 MG tablet Take 10 mg by mouth daily. 12/26/16  Yes [provider]  clonazePAM (KLONOPIN) 1 MG tablet Take 0.5 mg by mouth daily as needed for anxiety.   Yes [provider]  Cyanocobalamin (VITAMIN B12 PO) Take 1 tablet by mouth daily. Reported on 10/22/2015   Yes [provider]  gabapentin (NEURONTIN) 300 MG capsule Take 300 mg by mouth at bedtime.   Yes [provider]  HYDROcodone-acetaminophen (NORCO) 7.5-325 MG tablet Take 1 tablet by  mouth 2 (two) times daily. 09/25/15  Yes [provider]  latanoprost (XALATAN) 0.005 % ophthalmic solution Place 1 drop into both eyes every morning.   Yes [provider]  lovastatin (MEVACOR) 10 MG tablet Take 10 mg by mouth 2 (two) times a week. Twice weekly. Days are unknown   Yes [provider]  omeprazole (PRILOSEC) 20 MG capsule Take 20 mg by mouth daily.   Yes [provider]  tamoxifen (NOLVADEX) 20 MG tablet TAKE 1 TABLET BY MOUTH  DAILY 11/01/16  Yes Nicholas Lose, MD  traZODone (DESYREL) 100 MG tablet Take 250 mg by mouth at bedtime. Reported on 12/16/2015   Yes [provider]    Family History Family History  Problem Relation Age of Onset  . Breast cancer Sister        breast  . Suicidality Father   . Heart failure Mother   . Hypertension Other   . Drug abuse Daughter   . Heart Problems Brother 35       CABG  . Osteoarthritis Sister        3 shoulder surgeries, had staph infection once  . Heart Problems Brother   . Seizures Neg Hx     Social History Social History  Substance Use Topics  . Smoking status: Never Smoker  . Smokeless tobacco: Current User    Types: Chew, Snuff     Comment: 10/26/15 snuff 1 tsp daily  . Alcohol use No     Allergies   Penicillins and Celexa [citalopram]   Review of Systems Review of Systems  Constitutional: Negative for fever.  HENT: Negative for sore throat.   Eyes: Negative for visual disturbance.  Respiratory: Negative for cough and shortness of breath.   Cardiovascular: Negative for chest pain.  Gastrointestinal: Negative for abdominal pain, nausea and vomiting.  Genitourinary: Negative for difficulty urinating.  Musculoskeletal: Negative for back pain and neck pain.  Skin: Negative for rash.  Neurological: Positive for weakness and numbness. Negative for syncope, facial asymmetry and headaches.     Physical Exam Updated Vital Signs BP (!) 133/43 (BP Location: Left Arm)    Pulse 79   Temp 97.7 F (36.5 C) (Oral)   Resp 16   Ht 4\' 11"  (1.499 m)   Wt 66.4 kg (146 lb 6.2 oz)   SpO2 100%   BMI 29.57 kg/m   Physical Exam  Constitutional: She is oriented to person, place, and time. She appears well-developed and well-nourished. No distress.  HENT:  Head: Normocephalic and atraumatic.  Eyes: Conjunctivae and EOM are normal.  Neck: Normal range of motion.  Cardiovascular: Normal rate, regular rhythm, normal heart sounds and intact  distal pulses.  Exam reveals no gallop and no friction rub.   No murmur heard. Pulmonary/Chest: Effort normal and breath sounds normal. No respiratory distress. She has no wheezes. She has no rales.  Abdominal: Soft. She exhibits no distension. There is no tenderness. There is no guarding.  Musculoskeletal: She exhibits no edema or tenderness.  Neurological: She is alert and oriented to person, place, and time. A sensory deficit (left facial) is present. No cranial nerve deficit (sensation different left side of face). GCS eye subscore is 4. GCS verbal subscore is 5. GCS motor subscore is 6.  Strength LUE mildly decreased, mild tremor with left sided finger to nose   Skin: Skin is warm and dry. No rash noted. She is not diaphoretic. No erythema.  Nursing note and vitals reviewed.    ED Treatments / Results  Labs (all labs ordered are listed, but only abnormal results are displayed) Labs Reviewed  CBC - Abnormal; Notable for the following:       Result Value   Platelets 145 (*)    All other components within normal limits  COMPREHENSIVE METABOLIC PANEL - Abnormal; Notable for the following:    Glucose, Bld 110 (*)    Creatinine, Ser 1.39 (*)    Total Protein 6.1 (*)    ALT 13 (*)    GFR calc non Af Amer 36 (*)    GFR calc Af Amer 42 (*)    All other components within normal limits  I-STAT CHEM 8, ED - Abnormal; Notable for the following:    Creatinine, Ser 1.30 (*)    Glucose, Bld 103 (*)    Calcium, Ion 1.13 (*)     All other components within normal limits  PROTIME-INR  APTT  DIFFERENTIAL  LIPID PANEL  HEMOGLOBIN J8A  BASIC METABOLIC PANEL  I-STAT TROPOININ, ED    EKG  EKG Interpretation None       Radiology Mr Brain Wo Contrast  Result Date: 02/07/2017 CLINICAL DATA:  76 y/o F; left-sided numbness, weakness, and slurred speech. EXAM: MRI HEAD WITHOUT CONTRAST MRA HEAD WITHOUT CONTRAST TECHNIQUE: Multiplanar, multiecho pulse sequences of the brain and surrounding structures were obtained without intravenous contrast. Angiographic images of the head were obtained using MRA technique without contrast. COMPARISON:  02/07/2017 CT of the head.  11/04/2015 MRI of the head. FINDINGS: MRI HEAD FINDINGS Brain: No diffusion restriction to suggest acute infarct. No abnormal susceptibility hypointensity to indicate intracranial hemorrhage. Few foci of T2 FLAIR hyperintense signal abnormality in subcortical and periventricular white matter compatible with mild chronic microvascular ischemic changes. Mild brain parenchymal volume loss. No hydrocephalus. No extra-axial collection is identified. Stable 13 x 13 x 19 mm left parafalcine extra-axial mass compatible with meningioma (series 7, image 17 and series 11, image 8). No edema in the overlying brain. Vascular: Normal flow voids. Skull and upper cervical spine: Normal marrow signal. Sinuses/Orbits: Small left maxillary sinus mucous retention cyst. Otherwise negative. Other: None. MRA HEAD FINDINGS Internal carotid arteries:  Patent. Anterior cerebral arteries:  Patent. Middle cerebral arteries: Patent. Anterior communicating artery: Patent. Posterior communicating arteries: Patent right. No left identified, likely hypoplastic or absent. Posterior cerebral arteries:  Patent. Basilar artery:  Patent. Vertebral arteries:  Patent. No evidence of high-grade stenosis, large vessel occlusion, or aneurysm. IMPRESSION: 1. No acute intracranial abnormality identified. 2. Mild  chronic microvascular ischemic changes and mild parenchymal volume loss of the brain. 3. Stable left parafalcine parietal region meningioma measuring up to 19 mm. 4. Normal MRA  of the head. Electronically Signed   By: Kristine Garbe M.D.   On: 02/07/2017 21:08   Dg Chest Port 1 View  Result Date: 02/07/2017 CLINICAL DATA:  Left-sided facial numbness EXAM: PORTABLE CHEST 1 VIEW COMPARISON:  10/29/2015 FINDINGS: There is no focal parenchymal opacity. There is no pleural effusion or pneumothorax. The heart mediastinum are stable. There are surgical clips in the right axilla. The osseous structures are unremarkable. IMPRESSION: No active disease. Electronically Signed   By: Kathreen Devoid   On: 02/07/2017 16:41   Mr Jodene Nam Head/brain Wo Cm  Result Date: 02/07/2017 CLINICAL DATA:  76 y/o F; left-sided numbness, weakness, and slurred speech. EXAM: MRI HEAD WITHOUT CONTRAST MRA HEAD WITHOUT CONTRAST TECHNIQUE: Multiplanar, multiecho pulse sequences of the brain and surrounding structures were obtained without intravenous contrast. Angiographic images of the head were obtained using MRA technique without contrast. COMPARISON:  02/07/2017 CT of the head.  11/04/2015 MRI of the head. FINDINGS: MRI HEAD FINDINGS Brain: No diffusion restriction to suggest acute infarct. No abnormal susceptibility hypointensity to indicate intracranial hemorrhage. Few foci of T2 FLAIR hyperintense signal abnormality in subcortical and periventricular white matter compatible with mild chronic microvascular ischemic changes. Mild brain parenchymal volume loss. No hydrocephalus. No extra-axial collection is identified. Stable 13 x 13 x 19 mm left parafalcine extra-axial mass compatible with meningioma (series 7, image 17 and series 11, image 8). No edema in the overlying brain. Vascular: Normal flow voids. Skull and upper cervical spine: Normal marrow signal. Sinuses/Orbits: Small left maxillary sinus mucous retention cyst. Otherwise  negative. Other: None. MRA HEAD FINDINGS Internal carotid arteries:  Patent. Anterior cerebral arteries:  Patent. Middle cerebral arteries: Patent. Anterior communicating artery: Patent. Posterior communicating arteries: Patent right. No left identified, likely hypoplastic or absent. Posterior cerebral arteries:  Patent. Basilar artery:  Patent. Vertebral arteries:  Patent. No evidence of high-grade stenosis, large vessel occlusion, or aneurysm. IMPRESSION: 1. No acute intracranial abnormality identified. 2. Mild chronic microvascular ischemic changes and mild parenchymal volume loss of the brain. 3. Stable left parafalcine parietal region meningioma measuring up to 19 mm. 4. Normal MRA of the head. Electronically Signed   By: Kristine Garbe M.D.   On: 02/07/2017 21:08   Ct Head Code Stroke W/o Cm  Result Date: 02/07/2017 CLINICAL DATA:  Code stroke.  Code stroke.  Left facial numbness. EXAM: CT HEAD WITHOUT CONTRAST TECHNIQUE: Contiguous axial images were obtained from the base of the skull through the vertex without intravenous contrast. COMPARISON:  MRI 11/04/2015 FINDINGS: Brain: No sign of acute infarction. No significant small-vessel changes evident by CT. No intra-axial mass lesion, hemorrhage, hydrocephalus or extra-axial collection. 13 x 13 x 19 mm meningioma of the posterior falx, projecting more towards the left, is unchanged. Vascular: There is atherosclerotic calcification of the major vessels at the base of the brain. Skull: Normal Sinuses/Orbits: Clear/normal Other: None significant ASPECTS ([place] Stroke Program Early CT Score) - Ganglionic level infarction (caudate, lentiform nuclei, internal capsule, insula, M1-M3 cortex): 7 - Supraganglionic infarction (M4-M6 cortex): 3 Total score (0-10 with 10 being normal): 10 IMPRESSION: 1. No acute finding. No ischemic changes. Chronic 13 x 13 x 19 mm meningioma of the posterior falx. 2. ASPECTS is 10. These results were called by telephone at  the time of interpretation on 02/07/2017 at 2:50 pm to Dr. Leonel Ramsay, who verbally acknowledged these results. Electronically Signed   By: Nelson Chimes M.D.   On: 02/07/2017 14:52    Procedures Procedures (including critical care time)  Medications Ordered in ED Medications  tamoxifen (NOLVADEX) tablet 20 mg (20 mg Oral Given 02/07/17 2133)  latanoprost (XALATAN) 0.005 % ophthalmic solution 1 drop (1 drop Both Eyes Given 02/07/17 2133)  HYDROcodone-acetaminophen (NORCO) 7.5-325 MG per tablet 1 tablet (not administered)  clonazePAM (KLONOPIN) tablet 0.5 mg (0.5 mg Oral Given 02/07/17 2133)  gabapentin (NEURONTIN) capsule 300 mg (300 mg Oral Given 02/07/17 2133)  pravastatin (PRAVACHOL) tablet 10 mg (10 mg Oral Given 02/07/17 2133)  pantoprazole (PROTONIX) EC tablet 40 mg (40 mg Oral Given 02/07/17 2133)  traZODone (DESYREL) tablet 250 mg (250 mg Oral Given 02/07/17 2132)  0.9 %  sodium chloride infusion ( Intravenous New Bag/Given 02/07/17 1906)  acetaminophen (TYLENOL) tablet 650 mg (not administered)    Or  acetaminophen (TYLENOL) solution 650 mg (not administered)    Or  acetaminophen (TYLENOL) suppository 650 mg (not administered)  senna-docusate (Senokot-S) tablet 1 tablet (not administered)  enoxaparin (LOVENOX) injection 40 mg (40 mg Subcutaneous Given 02/07/17 2132)   stroke: mapping our early stages of recovery book ( Does not apply Given 02/07/17 2000)  LORazepam (ATIVAN) injection 1 mg (1 mg Intravenous Given 02/07/17 1959)  aspirin tablet 325 mg (325 mg Oral Given 02/07/17 2133)   CRITICAL CARE: code stroke, tpa considered Performed by: Alvino Chapel   Total critical care time: 30 minutes  Critical care time was exclusive of separately billable procedures and treating other patients.  Critical care was necessary to treat or prevent imminent or life-threatening deterioration.  Critical care was time spent personally by me on the following activities: development of  treatment plan with patient and/or surrogate as well as nursing, discussions with consultants, evaluation of patient's response to treatment, examination of patient, obtaining history from patient or surrogate, ordering and performing treatments and interventions, ordering and review of laboratory studies, ordering and review of radiographic studies, pulse oximetry and re-evaluation of patient's condition.   Initial Impression / Assessment and Plan / ED Course  I have reviewed the triage vital signs and the nursing notes.  Pertinent labs & imaging results that were available during my care of the patient were reviewed by me and considered in my medical decision making (see chart for details).     76yo female presents with concern for htn, hlpd, breast cancer, presents as Code Stroke with concern for left sided lumgness in left face and upper extremity. Mild weakness noted on exam. CT without acute findings.  Neurology at bedside. TPa not given given mild symptom, feeling risk outweigh benefits. Admitted for further care.    Final Clinical Impressions(s) / ED Diagnoses   Final diagnoses:  Left sided numbness    New Prescriptions Current Discharge Medication List       Gareth Morgan, MD 02/07/17 2235

## 2017-02-07 NOTE — Consult Note (Signed)
Requesting Physician: Dr. Billy Fischer    Chief Complaint: left sided weakness and numbness  History obtained from:  Patient     HPI:                                                                                                                                         Kathryn Ortiz is an 76 y.o. female who apparently was feeling well until 11:00 today when she was at a [employer] study and suddenly was noticed to have a left facial droop, left facial numbness, left arm weakness and some decreased sensation in the left arm. Patient was brought to the emergency department via car. While in the waiting room patient was brought back and code stroke was initiated. Patient was brought to CT scan which showed no acute stroke. By the time patient was seen by neurology patient stated that her numbness had somewhat improved however was still present and she stated that she still felt mildly weak on the left arm. Her left facial droop appeared to have resolved. She was not a TPA candidate due to mild symptoms. However patient likely did suffer a stroke and will be admitted via the hospitalist team.  Date last known well: Date: 02/07/2017 Time last known well: Time: 11:00 tPA Given: No: minimal symptoms   Past Medical History:  Diagnosis Date  . Anxiety    takes Clonazepam daily as needed  . Arthritis   . Back pain    from knee pain  . Breast cancer ([place])    Right breast  . Depression    takes Celexa daily  . GERD (gastroesophageal reflux disease)    takes Omeprazole daily  . Hyperlipidemia    takes Lovastatin 2 times a week  . Hypertension    was on meds but has been off x 2 yr  . Insomnia    takes Trazodone nightly  . Joint pain   . Joint swelling   . Macular degeneration    dry   . Osteoarthritis of right knee, primary localized 04/14/2015  . S/P total knee arthroplasty    R  . Sciatica   . Stenosis of carotid artery 10-27-15   R & L  . Syncope 11-13-15  . Urinary frequency   . Weakness     tingling in hands and feet    Past Surgical History:  Procedure Laterality Date  . BREAST EXCISIONAL BIOPSY     left   . BREAST LUMPECTOMY     October 01 2015 right  . BREAST LUMPECTOMY WITH RADIOACTIVE SEED AND SENTINEL LYMPH NODE BIOPSY Right 09/28/2015   Procedure: BREAST LUMPECTOMY WITH RADIOACTIVE SEED AND SENTINEL LYMPH NODE BIOPSY;  Surgeon: Autumn Messing III, MD;  Location: [facility];  Service: General;  Laterality: Right;  . BREAST SURGERY     breast biopsy  . cataract surgery Bilateral   . COLONOSCOPY    .  epidural injections    . SHOULDER SURGERY Bilateral   . TOTAL KNEE ARTHROPLASTY Right 01/12/2015  . TOTAL KNEE ARTHROPLASTY Right 04/14/2015   Procedure: TOTAL KNEE ARTHROPLASTY;  Surgeon: Marchia Bond, MD;  Location: [facility];  Service: Orthopedics;  Laterality: Right;    Family History  Problem Relation Age of Onset  . Breast cancer Sister        breast  . Suicidality Father   . Heart failure Mother   . Hypertension Other   . Drug abuse Daughter   . Heart Problems Brother 35       CABG  . Osteoarthritis Sister        3 shoulder surgeries, had staph infection once  . Heart Problems Brother   . Seizures Neg Hx    Social History:  reports that she has never smoked. Her smokeless tobacco use includes Chew and Snuff. She reports that she does not drink alcohol or use drugs.  Allergies:  Allergies  Allergen Reactions  . Penicillins Hives, Itching and Other (See Comments)    Welps, burning and makes patient sick. Pt. States she had problems breathing. Arms swelling. Tolerated Ancef pre-op 04/14/15  . Celexa [Citalopram] Other (See Comments)    Felt bad    Medications:                                                                                                                           No current facility-administered medications for this encounter.    Current Outpatient Prescriptions  Medication Sig Dispense Refill  . Cholecalciferol (VITAMIN D PO)  Take 1 tablet by mouth daily.    . clonazePAM (KLONOPIN) 1 MG tablet Take 0.5 mg by mouth daily as needed for anxiety.    . Cyanocobalamin (VITAMIN B12 PO) Take 1 tablet by mouth daily. Reported on 10/22/2015    . gabapentin (NEURONTIN) 300 MG capsule Take 300 mg by mouth at bedtime.    Marland Kitchen HYDROcodone-acetaminophen (NORCO) 7.5-325 MG tablet Take 1 tablet by mouth 2 (two) times daily.  0  . latanoprost (XALATAN) 0.005 % ophthalmic solution Place 1 drop into both eyes every morning.    . lovastatin (MEVACOR) 10 MG tablet Take 10 mg by mouth 2 (two) times a week. Reported on 12/16/2015    . omeprazole (PRILOSEC) 20 MG capsule Take 20 mg by mouth daily.    . tamoxifen (NOLVADEX) 20 MG tablet TAKE 1 TABLET BY MOUTH  DAILY 90 tablet 3  . traZODone (DESYREL) 100 MG tablet Take 250 mg by mouth at bedtime. Reported on 12/16/2015       ROS:  History obtained from the patient  General ROS: negative for - chills, fatigue, fever, night sweats, weight gain or weight loss Psychological ROS: negative for - behavioral disorder, hallucinations, memory difficulties, mood swings or suicidal ideation Ophthalmic ROS: negative for - blurry vision, double vision, eye pain or loss of vision ENT ROS: negative for - epistaxis, nasal discharge, oral lesions, sore throat, tinnitus or vertigo Allergy and Immunology ROS: negative for - hives or itchy/watery eyes Hematological and Lymphatic ROS: negative for - bleeding problems, bruising or swollen lymph nodes Endocrine ROS: negative for - galactorrhea, hair pattern changes, polydipsia/polyuria or temperature intolerance Respiratory ROS: negative for - cough, hemoptysis, shortness of breath or wheezing Cardiovascular ROS: negative for - chest pain, dyspnea on exertion, edema or irregular heartbeat Gastrointestinal ROS: negative for - abdominal  pain, diarrhea, hematemesis, nausea/vomiting or stool incontinence Genito-Urinary ROS: negative for - dysuria, hematuria, incontinence or urinary frequency/urgency Musculoskeletal ROS: negative for - joint swelling or muscular weakness Neurological ROS: as noted in HPI Dermatological ROS: negative for rash and skin lesion changes  Neurologic Examination:                                                                                                      Blood pressure (!) 129/59, pulse 78, temperature 98.1 F (36.7 C), temperature source Oral, resp. rate 19, height 4\' 11"  (1.499 m), weight 66.4 kg (146 lb 6.2 oz), SpO2 97 %.  HEENT-  Normocephalic, no lesions, without obvious abnormality.  Normal external eye and conjunctiva.  Normal TM's bilaterally.  Normal auditory canals and external ears. Normal external nose, mucus membranes and septum.  Normal pharynx. Cardiovascular- S1, S2 normal, pulses palpable throughout   Lungs- chest clear, no wheezing, rales, normal symmetric air entry Abdomen- normal findings: bowel sounds normal Extremities- no edema Lymph-no adenopathy palpable Musculoskeletal-no joint tenderness, deformity or swelling Skin-warm and dry, no hyperpigmentation, vitiligo, or suspicious lesions  Neurological Examination Mental Status: Alert, oriented, thought content appropriate.  Speech fluent without evidence of aphasia.  Able to follow 3 step commands without difficulty. Cranial Nerves: II:  Visual fields grossly normal,  III,IV, VI: ptosis not present, extra-ocular motions intact bilaterally, pupils equal, round, reactive to light and accommodation V,VII: smile symmetric, facial light touch sensation decreased on the left VIII: hearing normal bilaterally IX,X: uvula rises symmetrically XI: bilateral shoulder shrug XII: midline tongue extension Motor: Right : Upper extremity   5/5    Left:     Upper extremity   4/5  Lower extremity   5/5     Lower extremity    5/5 Tone and bulk:normal tone throughout; no atrophy noted Sensory: Pinprick and light touch intact decreased on the left arm Deep Tendon Reflexes: 2+ and symmetric throughout Plantars: Right: downgoing   Left: downgoing Cerebellar: Mild dysmetria with finger to nose and heel to shin on the left however this likely is secondary to weakness. Gait: Not tested due to safety       Lab Results: Basic Metabolic Panel:  Recent Labs Lab 02/07/17 1445  NA 140  K 4.2  CL 104  GLUCOSE 103*  BUN 11  CREATININE 1.30*    Liver Function Tests: No results for input(s): AST, ALT, ALKPHOS, BILITOT, PROT, ALBUMIN in the last 168 hours. No results for input(s): LIPASE, AMYLASE in the last 168 hours. No results for input(s): AMMONIA in the last 168 hours.  CBC:  Recent Labs Lab 02/07/17 1435 02/07/17 1445  WBC 4.2  --   NEUTROABS 2.3  --   HGB 12.1 12.6  HCT 37.3 37.0  MCV 91.0  --   PLT 145*  --     Cardiac Enzymes: No results for input(s): CKTOTAL, CKMB, CKMBINDEX, TROPONINI in the last 168 hours.  Lipid Panel: No results for input(s): CHOL, TRIG, HDL, CHOLHDL, VLDL, LDLCALC in the last 168 hours.  CBG: No results for input(s): GLUCAP in the last 168 hours.  Microbiology: Results for orders placed or performed during the hospital encounter of 04/03/15  Surgical pcr screen     Status: None   Collection Time: 04/03/15 10:34 AM  Result Value Ref Range Status   MRSA, PCR NEGATIVE NEGATIVE Final   Staphylococcus aureus NEGATIVE NEGATIVE Final    Comment:        The Xpert SA Assay (FDA approved for NASAL specimens in patients over 37 years of age), is one component of a comprehensive surveillance program.  Test performance has been validated by [facility] for patients greater than or equal to 21 year old. It is not intended to diagnose infection nor to guide or monitor treatment.     Coagulation Studies:  Recent Labs  02/07/17 1435  LABPROT 13.4  INR 1.01     Imaging: Ct Head Code Stroke W/o Cm  Result Date: 02/07/2017 CLINICAL DATA:  Code stroke.  Code stroke.  Left facial numbness. EXAM: CT HEAD WITHOUT CONTRAST TECHNIQUE: Contiguous axial images were obtained from the base of the skull through the vertex without intravenous contrast. COMPARISON:  MRI 11/04/2015 FINDINGS: Brain: No sign of acute infarction. No significant small-vessel changes evident by CT. No intra-axial mass lesion, hemorrhage, hydrocephalus or extra-axial collection. 13 x 13 x 19 mm meningioma of the posterior falx, projecting more towards the left, is unchanged. Vascular: There is atherosclerotic calcification of the major vessels at the base of the brain. Skull: Normal Sinuses/Orbits: Clear/normal Other: None significant ASPECTS ([place] Stroke Program Early CT Score) - Ganglionic level infarction (caudate, lentiform nuclei, internal capsule, insula, M1-M3 cortex): 7 - Supraganglionic infarction (M4-M6 cortex): 3 Total score (0-10 with 10 being normal): 10 IMPRESSION: 1. No acute finding. No ischemic changes. Chronic 13 x 13 x 19 mm meningioma of the posterior falx. 2. ASPECTS is 10. These results were called by telephone at the time of interpretation on 02/07/2017 at 2:50 pm to Dr. Leonel Ramsay, who verbally acknowledged these results. Electronically Signed   By: Nelson Chimes M.D.   On: 02/07/2017 14:52       Assessment and plan discussed with with attending physician and they are in agreement.    Etta Quill PA-C [facility] [phone]  02/07/2017, 3:03 PM   Assessment: 76 y.o. female presenting the hospital with left facial numbness and left-sided weakness. Patient was not a TPA candidate secondary to mild symptoms.  Stroke Risk Factors - hyperlipidemia and hypertension   Recommendations:  1. HgbA1c, fasting lipid panel 2. MRI, MRA  of the brain without contrast 3. PT consult, OT consult, Speech consult 4. Echocardiogram 5. Carotid dopplers 6.  Prophylactic therapy-Antiplatelet med: Aspirin - dose 325 mg daily 7. Risk factor modification  8. Telemetry monitoring 9. Frequent neuro checks 10 NPO until passes stroke swallow screen 11 please page stroke NP  Or  PA  Or MD from 8am -4 pm  as this patient from this time will be  followed by the stroke.   You can look them up on [email or web address]  Password [email or web address]    Roland Rack, MD [facility] [phone]  If 7pm- 7am, please page neurology on call as listed in [place].

## 2017-02-07 NOTE — Code Documentation (Signed)
76 y.o. Female with PMHx of HTN, HLD, breast Ca, anxiety, syncope and stenosis of carotid artery who was in her normal state of health today while she was at church. While attending church, around 1100 she had an acute onset of left facial numbness, LUE numbness and LUE ataxia. Patient arrived [facility] ED via private vehicle and code stroke was called in the ED. Patient was met by the stroke team in CT. Per radiology report, CT revealed no acute intracranial abnormalities, chronic 13 x 13 x 19 mm meningioma of the posterior falx. ASPECTS 10. NIHSS 3. See EMR for NIHSS and code stroke times. On assessment, patient with mild ataxia in her LUE and LLE, missed month on LOC questions and had mild sensory loss to the left side. IV tPA not given d/t being too mild to treat. ED bedside handoff with ED RN Millie.

## 2017-02-07 NOTE — H&P (Signed)
History and Physical    Kathryn Ortiz XBJ:[id number] DOB: 1940-08-11 DOA: 02/07/2017  PCP: Jani Gravel, MD Patient coming from: home  Chief Complaint: left sided numbness and weakness/slurred speech  HPI: Kathryn Ortiz is a very pleasant 76 y.o. female with medical history significant syncope, stenosis of carotid artery, osteoarthritis, macular degeneration, hypertension, hyperlipidemia, breast cancer, anxiety presents to the emergency Department chief complaint of left-sided numbness and slurred speech.  Head stroke initiated she was seen by neurology who opined she likely did suffer a stroke but is not a TPA candidate due to mild symptoms  Information is obtained from the patient and her husband who is at the bedside. Patient reports she was in her usual state of health attending Bible study at church this morning around 11 AM when she suddenly noticed the left side of her face felt numb. She states "it felt like obese to me" associated symptoms include tingling of her left arm along with weakness of her left hand. She also reports some difficulty swallowing and believes that her face was drooping as well. She drove herself home and reported her symptoms to her husband who brought her to the emergency department. She denies headache dizziness syncope or near-syncope. She denies chest pain palpitation shortness of breath diaphoresis. She denies numbness tingling of her lower extremities. She denies fever chills sick contacts or recent traveling. She denies dysuria hematuria frequency or urgency.    ED Course: In the emergency department she's afebrile hemodynamically stable and not hypoxic. At the time of admission she has not had her bedside swallow eval so she remains nothing by mouth.  Review of Systems: As per HPI otherwise all other systems reviewed and are negative.   Ambulatory Status: Ambulates independently with a steady gait has no recent falls.  Past Medical History:  Diagnosis Date    . Anxiety    takes Clonazepam daily as needed  . Arthritis   . Back pain    from knee pain  . Breast cancer ([place])    Right breast  . Depression    takes Celexa daily  . GERD (gastroesophageal reflux disease)    takes Omeprazole daily  . Hyperlipidemia    takes Lovastatin 2 times a week  . Hypertension    was on meds but has been off x 2 yr  . Insomnia    takes Trazodone nightly  . Joint pain   . Joint swelling   . Macular degeneration    dry   . Osteoarthritis of right knee, primary localized 04/14/2015  . S/P total knee arthroplasty    R  . Sciatica   . Stenosis of carotid artery 10-27-15   R & L  . Syncope 11-13-15  . Urinary frequency   . Weakness    tingling in hands and feet    Past Surgical History:  Procedure Laterality Date  . BREAST EXCISIONAL BIOPSY     left   . BREAST LUMPECTOMY     October 01 2015 right  . BREAST LUMPECTOMY WITH RADIOACTIVE SEED AND SENTINEL LYMPH NODE BIOPSY Right 09/28/2015   Procedure: BREAST LUMPECTOMY WITH RADIOACTIVE SEED AND SENTINEL LYMPH NODE BIOPSY;  Surgeon: Autumn Messing III, MD;  Location: [facility];  Service: General;  Laterality: Right;  . BREAST SURGERY     breast biopsy  . cataract surgery Bilateral   . COLONOSCOPY    . epidural injections    . SHOULDER SURGERY Bilateral   . TOTAL KNEE ARTHROPLASTY  Right 01/12/2015  . TOTAL KNEE ARTHROPLASTY Right 04/14/2015   Procedure: TOTAL KNEE ARTHROPLASTY;  Surgeon: Marchia Bond, MD;  Location: [facility];  Service: Orthopedics;  Laterality: Right;    Social History   Social History  . Marital status: Married    Spouse name: [name]  . Number of children: 3  . Years of education: 55 +   Occupational History  .      retired   Social History Main Topics  . Smoking status: Never Smoker  . Smokeless tobacco: Current User    Types: Chew, Snuff     Comment: 10/26/15 snuff 1 tsp daily  . Alcohol use No  . Drug use: No  . Sexual activity: Yes    Birth control/ protection:  Post-menopausal   Other Topics Concern  . Not on file   Social History Narrative   Lives at home with husband   Caffeine - coffee 2 cups daily    Allergies  Allergen Reactions  . Penicillins Hives, Itching and Other (See Comments)    Welps, burning and makes patient sick. Pt. States she had problems breathing. Arms swelling. Tolerated Ancef pre-op 04/14/15  . Celexa [Citalopram] Other (See Comments)    Felt bad    Family History  Problem Relation Age of Onset  . Breast cancer Sister        breast  . Suicidality Father   . Heart failure Mother   . Hypertension Other   . Drug abuse Daughter   . Heart Problems Brother 35       CABG  . Osteoarthritis Sister        3 shoulder surgeries, had staph infection once  . Heart Problems Brother   . Seizures Neg Hx     Prior to Admission medications   Medication Sig Start Date End Date Taking? Authorizing Provider  Cholecalciferol (VITAMIN D PO) Take 1 tablet by mouth daily.   Yes [provider]  citalopram (CELEXA) 10 MG tablet Take 10 mg by mouth daily. 12/26/16  Yes [provider]  clonazePAM (KLONOPIN) 1 MG tablet Take 0.5 mg by mouth daily as needed for anxiety.   Yes [provider]  Cyanocobalamin (VITAMIN B12 PO) Take 1 tablet by mouth daily. Reported on 10/22/2015   Yes [provider]  gabapentin (NEURONTIN) 300 MG capsule Take 300 mg by mouth at bedtime.   Yes [provider]  HYDROcodone-acetaminophen (NORCO) 7.5-325 MG tablet Take 1 tablet by mouth 2 (two) times daily. 09/25/15  Yes [provider]  latanoprost (XALATAN) 0.005 % ophthalmic solution Place 1 drop into both eyes every morning.   Yes [provider]  lovastatin (MEVACOR) 10 MG tablet Take 10 mg by mouth 2 (two) times a week. Twice weekly. Days are unknown   Yes [provider]  omeprazole (PRILOSEC) 20 MG capsule Take 20 mg by mouth daily.   Yes [provider]  tamoxifen (NOLVADEX)  20 MG tablet TAKE 1 TABLET BY MOUTH  DAILY 11/01/16  Yes Nicholas Lose, MD  traZODone (DESYREL) 100 MG tablet Take 250 mg by mouth at bedtime. Reported on 12/16/2015   Yes [provider]    Physical Exam: Vitals:   02/07/17 1452 02/07/17 1456 02/07/17 1530 02/07/17 1545  BP:  (!) 129/59 116/62 (!) 121/55  Pulse:  78 60 65  Resp:  19 16 19   Temp:  98.1 F (36.7 C)    TempSrc:  Oral    SpO2:  97% 99% 100%  Weight: 66.4 kg (146 lb 6.2 oz)     Height: 4\' 11"  (1.499 m)        General:  Appears Only slightly anxious but not uncomfortable  Eyes:  PERRL, EOMI, normal lids, iris ENT:  grossly normal hearing, lips & tongue, mucous membranes of her mouth are pink but dry Neck:  no LAD, masses or thyromegaly Cardiovascular:  RRR, no m/r/g. No LE edema.  Respiratory:  CTA bilaterally, no w/r/r. Normal respiratory effort. Abdomen:  soft, ntnd, positive bowel sounds throughout no guarding or rebounding Skin:  no rash or induration seen on limited exam Musculoskeletal:  grossly normal tone BUE/BLE, good ROM, no bony abnormality Psychiatric:  grossly normal mood and affect, speech fluent and appropriate, AOx3 Neurologic:  Alert and oriented 3. Speech slurred bilateral grip 5 out of 5 lower extremity strength 5 out of 5 questionable facial droop crease sensation on left cheek  Labs on Admission: I have personally reviewed following labs and imaging studies  CBC:  Recent Labs Lab 02/07/17 1435 02/07/17 1445  WBC 4.2  --   NEUTROABS 2.3  --   HGB 12.1 12.6  HCT 37.3 37.0  MCV 91.0  --   PLT 145*  --    Basic Metabolic Panel:  Recent Labs Lab 02/07/17 1435 02/07/17 1445  NA 138 140  K 4.4 4.2  CL 106 104  CO2 24  --   GLUCOSE 110* 103*  BUN 10 11  CREATININE 1.39* 1.30*  CALCIUM 9.0  --    GFR: Estimated Creatinine Clearance: 30.5 mL/min (A) (by C-G formula based on SCr of 1.3 mg/dL (H)). Liver Function Tests:  Recent Labs Lab 02/07/17 1435  AST 27  ALT 13*    ALKPHOS 43  BILITOT 0.3  PROT 6.1*  ALBUMIN 3.7   No results for input(s): LIPASE, AMYLASE in the last 168 hours. No results for input(s): AMMONIA in the last 168 hours. Coagulation Profile:  Recent Labs Lab 02/07/17 1435  INR 1.01   Cardiac Enzymes: No results for input(s): CKTOTAL, CKMB, CKMBINDEX, TROPONINI in the last 168 hours. BNP (last 3 results) No results for input(s): PROBNP in the last 8760 hours. HbA1C: No results for input(s): HGBA1C in the last 72 hours. CBG: No results for input(s): GLUCAP in the last 168 hours. Lipid Profile:  Recent Labs  02/07/17 1435  CHOL 163  HDL 66  LDLCALC 79  TRIG 90  CHOLHDL 2.5   Thyroid Function Tests: No results for input(s): TSH, T4TOTAL, FREET4, T3FREE, THYROIDAB in the last 72 hours. Anemia Panel: No results for input(s): VITAMINB12, FOLATE, FERRITIN, TIBC, IRON, RETICCTPCT in the last 72 hours. Urine analysis:    Component Value Date/Time   COLORURINE YELLOW 10/20/2016 1915   APPEARANCEUR HAZY (A) 10/20/2016 1915   LABSPEC 1.018 10/20/2016 1915   PHURINE 5.0 10/20/2016 1915   GLUCOSEU NEGATIVE 10/20/2016 1915   HGBUR SMALL (A) 10/20/2016 1915   BILIRUBINUR NEGATIVE 10/20/2016 1915   KETONESUR NEGATIVE 10/20/2016 1915   PROTEINUR 30 (A) 10/20/2016 1915   UROBILINOGEN 0.2 05/30/2008 2222   NITRITE NEGATIVE 10/20/2016 1915   LEUKOCYTESUR NEGATIVE 10/20/2016 1915    Creatinine Clearance: Estimated Creatinine Clearance: 30.5 mL/min (A) (by C-G formula based on SCr of 1.3 mg/dL (H)).  Sepsis Labs: @LABRCNTIP (procalcitonin:4,lacticidven:4) )No results found for this or any previous visit (from the past 240 hour(s)).   Radiological Exams on Admission: Ct Head Code Stroke W/o Cm  Result Date: 02/07/2017 CLINICAL DATA:  Code stroke.  Code stroke.  Left facial numbness. EXAM: CT HEAD WITHOUT CONTRAST TECHNIQUE: Contiguous axial images were obtained from the base of the skull through the vertex without intravenous  contrast. COMPARISON:  MRI 11/04/2015 FINDINGS: Brain: No sign of acute infarction. No significant small-vessel changes evident by CT. No intra-axial mass lesion, hemorrhage, hydrocephalus or extra-axial collection. 13 x 13 x 19 mm meningioma of the posterior falx, projecting more towards the left, is unchanged. Vascular: There is atherosclerotic calcification of the major vessels at the base of the brain. Skull: Normal Sinuses/Orbits: Clear/normal Other: None significant ASPECTS (Lee Stroke Program Early CT Score) - Ganglionic level infarction (caudate, lentiform nuclei, internal capsule, insula, M1-M3 cortex): 7 - Supraganglionic infarction (M4-M6 cortex): 3 Total score (0-10 with 10 being normal): 10 IMPRESSION: 1. No acute finding. No ischemic changes. Chronic 13 x 13 x 19 mm meningioma of the posterior falx. 2. ASPECTS is 10. These results were called by telephone at the time of interpretation on 02/07/2017 at 2:50 pm to Dr. Leonel Ramsay, who verbally acknowledged these results. Electronically Signed   By: Nelson Chimes M.D.   On: 02/07/2017 14:52    EKG: Independently reviewed. Sinus rhythm Borderline right axis deviation Anteroseptal infarct, old Nonspecific repol abnormality, diffuse leads  Assessment/Plan Principal Problem:   Stroke-like symptoms Active Problems:   GERD (gastroesophageal reflux disease)   Hyperlipidemia   Hypertension   Anxiety   Breast cancer of upper-inner quadrant of right female breast ([place])   CKD (chronic kidney disease), stage III   #1. Strokelike symptoms/left facial numbness/left arm weakness/slurred speech. CT of the head no acute finding. No ischemic changes. Chronic 13 x 13 x 19 mm meningioma of the posterior falx. Evaluated by neurology in the emergency department who opined likely stroke but outside window for TPA. Recommend complete workup -Admit to telemetry -MRI MRA brain -Carotid Dopplers -2-D echo -Lipid panel, hemoglobin A1c -Frequent neuro  checks -Aspirin -Statin -Physical therapy consult -Occupational therapy consult -Speech therapy consult -Nothing by mouth until she passes her swallow eval then a heart healthy diet  #2. Hypertension. Home medication list contains no antihypertensives. Blood pressure quite controlled in the emergency department. -Monitor  #3. Chronic kidney disease stage III. Creatinine 1.30 on admission. Chart review indicates this is very close to her baseline. -Gentle IV fluids -Hold nephrotoxins -Monitor urine output  #4. Hyperlipidemia. -Obtain a lipid panel -Continue home meds  #5. Anxiety. Appears stable at baseline. -Continue home meds  #6. GERD. Stable at baseline -Continue home meds    DVT prophylaxis: lovenox  Code Status: full  Family Communication: husband at bedside  Disposition Plan: home  Consults called: [facility]  Admission status: obs    Dyanne Carrel M MD [facility]  If 7PM-7AM, please contact night-coverage [email or web address] Password [email or web address]  02/07/2017, 4:16 PM

## 2017-02-07 NOTE — ED Notes (Signed)
Contacted [facility] to activate Code Stroke @ 1434

## 2017-02-07 NOTE — ED Triage Notes (Signed)
Pt arrives POV after noticed numbness at left face and arm while at bible studies at 11am. Pt alert and oriented x 4 and MAEW. Speaks in complete sentences.

## 2017-02-08 ENCOUNTER — Observation Stay (HOSPITAL_BASED_OUTPATIENT_CLINIC_OR_DEPARTMENT_OTHER): Payer: Medicare Other

## 2017-02-08 DIAGNOSIS — N183 Chronic kidney disease, stage 3 (moderate): Secondary | ICD-10-CM | POA: Diagnosis not present

## 2017-02-08 DIAGNOSIS — G451 Carotid artery syndrome (hemispheric): Secondary | ICD-10-CM | POA: Diagnosis not present

## 2017-02-08 DIAGNOSIS — D329 Benign neoplasm of meninges, unspecified: Secondary | ICD-10-CM | POA: Diagnosis not present

## 2017-02-08 DIAGNOSIS — F419 Anxiety disorder, unspecified: Secondary | ICD-10-CM | POA: Diagnosis not present

## 2017-02-08 DIAGNOSIS — G459 Transient cerebral ischemic attack, unspecified: Secondary | ICD-10-CM | POA: Diagnosis not present

## 2017-02-08 DIAGNOSIS — I10 Essential (primary) hypertension: Secondary | ICD-10-CM | POA: Diagnosis not present

## 2017-02-08 DIAGNOSIS — K219 Gastro-esophageal reflux disease without esophagitis: Secondary | ICD-10-CM | POA: Diagnosis not present

## 2017-02-08 DIAGNOSIS — R2990 Unspecified symptoms and signs involving the nervous system: Secondary | ICD-10-CM | POA: Diagnosis not present

## 2017-02-08 LAB — BASIC METABOLIC PANEL
ANION GAP: 5 (ref 5–15)
BUN: 11 mg/dL (ref 6–20)
CHLORIDE: 109 mmol/L (ref 101–111)
CO2: 26 mmol/L (ref 22–32)
CREATININE: 1.4 mg/dL — AB (ref 0.44–1.00)
Calcium: 8.4 mg/dL — ABNORMAL LOW (ref 8.9–10.3)
GFR calc non Af Amer: 35 mL/min — ABNORMAL LOW (ref >60)
GFR, EST AFRICAN AMERICAN: 41 mL/min — AB (ref >60)
Glucose, Bld: 85 mg/dL (ref 65–99)
POTASSIUM: 4.8 mmol/L (ref 3.5–5.1)
SODIUM: 140 mmol/L (ref 135–145)

## 2017-02-08 LAB — VAS US CAROTID
LEFT ECA DIAS: -7 cm/s
LEFT VERTEBRAL DIAS: -13 cm/s
Left CCA dist dias: 17 cm/s
Left CCA dist sys: 68 cm/s
Left CCA prox dias: 17 cm/s
Left CCA prox sys: 94 cm/s
Left ICA dist dias: -24 cm/s
Left ICA dist sys: -88 cm/s
Left ICA prox dias: -21 cm/s
Left ICA prox sys: -81 cm/s
RCCADSYS: -88 cm/s
RCCAPDIAS: -16 cm/s
RIGHT ECA DIAS: 0 cm/s
RIGHT VERTEBRAL DIAS: -15 cm/s
Right CCA prox sys: -88 cm/s

## 2017-02-08 LAB — HEMOGLOBIN A1C
HEMOGLOBIN A1C: 5.5 % (ref 4.8–5.6)
Mean Plasma Glucose: 111 mg/dL

## 2017-02-08 MED ORDER — ASPIRIN EC 81 MG PO TBEC
81.0000 mg | DELAYED_RELEASE_TABLET | Freq: Every day | ORAL | Status: DC
  Administered 2017-02-08 – 2017-02-09 (×2): 81 mg via ORAL
  Filled 2017-02-08 (×2): qty 1

## 2017-02-08 NOTE — Progress Notes (Signed)
STROKE TEAM PROGRESS NOTE   HISTORY OF PRESENT ILLNESS (per record) Kathryn Ortiz is an 76 y.o. female with a history of breast cancer, hypertension, hyperlipidemia, macular degeneration, sciatica, and carotid stenosis who felt well until 11:00 on 02/07/2017 when she was at a Bible study and developed sudden-onset left facial droop, left facial numbness, left arm weakness and decreased sensation in the left arm. Patient was brought to the emergency department via car. While in the waiting room patient was brought back and code stroke was initiated. Patient was brought to CT scan which showed no acute stroke. By the time patient was seen by neurology patient stated that her numbness had somewhat improved however was still present and she stated that she still felt mildly weak on the left arm. Her left facial droop appeared to have resolved. She was not a TPA candidate due to mild symptoms.    Date last known well: Date: 02/07/2017 Time last known well: Time: 11:00  Patient was not administered IV t-PA secondary presenting with minimal symtoms. She was admitted General Neurology for further evaluation and treatment.   SUBJECTIVE (INTERVAL HISTORY) She is alone in the room.  The patient is drowsy sleepy but easily arousable and following commands. She stated that she had sudden feeling of left face swelling, and left arm numbness and weakness yesterday and currently she felt almost back to normal.    OBJECTIVE Temp:  [97.6 F (36.4 C)-98.7 F (37.1 C)] 98.7 F (37.1 C) (07/18 1028) Pulse Rate:  [51-79] 60 (07/18 1028) Cardiac Rhythm: Normal sinus rhythm (07/18 0707) Resp:  [16-19] 18 (07/18 1028) BP: (108-153)/(43-85) 142/63 (07/18 1028) SpO2:  [97 %-100 %] 100 % (07/18 0530) Weight:  [66.4 kg (146 lb 6.2 oz)] 66.4 kg (146 lb 6.2 oz) (07/17 1452)  CBC:  Recent Labs Lab 02/07/17 1435 02/07/17 1445  WBC 4.2  --   NEUTROABS 2.3  --   HGB 12.1 12.6  HCT 37.3 37.0  MCV 91.0  --   PLT  145*  --     Basic Metabolic Panel:  Recent Labs Lab 02/07/17 1435 02/07/17 1445 02/08/17 0535  NA 138 140 140  K 4.4 4.2 4.8  CL 106 104 109  CO2 24  --  26  GLUCOSE 110* 103* 85  BUN 10 11 11   CREATININE 1.39* 1.30* 1.40*  CALCIUM 9.0  --  8.4*    Lipid Panel:    Component Value Date/Time   CHOL 163 02/07/2017 1435   TRIG 90 02/07/2017 1435   HDL 66 02/07/2017 1435   CHOLHDL 2.5 02/07/2017 1435   VLDL 18 02/07/2017 1435   LDLCALC 79 02/07/2017 1435   HgbA1c:  Lab Results  Component Value Date   HGBA1C 5.5 02/07/2017   Urine Drug Screen: No results found for: LABOPIA, COCAINSCRNUR, LABBENZ, AMPHETMU, THCU, LABBARB  Alcohol Level No results found for: [place] I have personally reviewed the radiological images below and agree with the radiology interpretations.  Mr Brain 34 Contrast Mr Jodene Nam Head/brain Wo Cm 02/07/2017 IMPRESSION: 1. No acute intracranial abnormality identified. 2. Mild chronic microvascular ischemic changes and mild parenchymal volume loss of the brain. 3. Stable left parafalcine parietal region meningioma measuring up to 19 mm. 4. Normal MRA of the head.   Dg Chest Port 1 View 02/07/2017 IMPRESSION: No active disease.  Ct Head Code Stroke W/o Cm 02/07/2017 IMPRESSION: 1. No acute finding. No ischemic changes. Chronic 13 x 13 x 19 mm meningioma of the posterior  falx. 2. ASPECTS is 10.   Carotid US 02/08/2017 B ICA 1-39% stenosis, VAs antegrade  2D Echo 10/22/16 Left ventricle: The cavity size was normal. Wall thickness was   normal. Systolic function was normal. The estimated ejection   fraction was in the range of 60% to 65%. Wall motion was normal;   there were no regional wall motion abnormalities. Features are   consistent with a pseudonormal left ventricular filling pattern,   with concomitant abnormal relaxation and increased filling   pressure (grade 2 diastolic dysfunction). - Mitral valve: Severely calcified annulus. The  findings are   consistent with trivial stenosis. There was mild regurgitation.   Valve area by pressure half-time: 2.24 cm^2. Valve area by   continuity equation (using LVOT flow): 1.21 cm^2. - Atrial septum: No defect or patent foramen ovale was identified.   PHYSICAL EXAM  Temp:  [97.5 F (36.4 C)-98.7 F (37.1 C)] 97.5 F (36.4 C) (07/18 2120) Pulse Rate:  [51-72] 72 (07/18 2120) Resp:  [16-18] 18 (07/18 2120) BP: (108-148)/(44-65) 127/58 (07/18 2120) SpO2:  [97 %-100 %] 97 % (07/18 2120)  General - Well nourished, well developed, mildly drowsy sleepy.  Ophthalmologic - Sharp disc margins OU..  Cardiovascular - Regular rate and rhythm.  Mental Status -  Level of arousal and orientation to time, place, and person were intact. Language including expression, naming, repetition, comprehension was assessed and found intact. Fund of Knowledge was assessed and was intact  Cranial Nerves II - XII - II - Visual field intact OU. III, IV, VI - Extraocular movements intact. V - Facial sensation intact bilaterally. VII - Facial movement intact bilaterally. VIII - Hearing & vestibular intact bilaterally. X - Palate elevates symmetrically. XI - Chin turning & shoulder shrug intact bilaterally. XII - Tongue protrusion intact.  Motor Strength - The patient's strength was normal in all extremities and pronator drift was absent.  Bulk was normal and fasciculations were absent.   Motor Tone - Muscle tone was assessed at the neck and appendages and was normal.  Reflexes - The patient's reflexes were 1+ in all extremities and she had no pathological reflexes.  Sensory - Light touch, temperature/pinprick were assessed and were symmetrical.    Coordination - The patient had normal movements in the hands with no ataxia or dysmetria.  Tremor was absent.  Gait and Station - deferred   ASSESSMENT/PLAN [name] is a 76 y.o. female with history of  hypertension, hyperlipidemia,  macular degeneration, sciatica, and carotid stenosis presenting with sudden-onset left facial droop, left facial numbness, left arm weakness and decreased sensation in the left arm. She did not receive IV t-PA due to presenting with minimal symptoms.   TIA most likely - subcortical pattern, likely due to small vessel disease.  Resultant  Deficit resolved  CT head: no acute stroke; chronic 13 x 13 x 19 mm meningioma of the posterior falx  MRI head: no acute stroke, Stable left parafalcine parietal region meningioma measuring up to 19 mm  MRA head: normal  Carotid Doppler: B ICA 1-39% stenosis, VAs antegrade  2D Echo 10/22/16 EF 60-65%  LDL 79  HgbA1c 5.5  SCDs for VTE prophylaxis  Diet Heart Room service appropriate? Yes; Fluid consistency: Thin  No antithrombotic prior to admission, now on aspirin 81 mg daily. Continue ASA on discharge.  Patient counseled to be compliant with her antithrombotic medications  Ongoing aggressive stroke risk factor management  Therapy recommendations: None  Disposition:  pending  Hypertension  Stable  Permissive hypertension (OK if < 220/120) but gradually normalize in 5-7 days  Long-term BP goal normotensive  Hyperlipidemia  Home meds: none  LDL 79, goal < 70  Add pravastatin 10mg  PO daily  Continue statin at discharge  Meningioma   Chronic at left parafalcine   Stable   Other Stroke Risk Factors  Advanced age  Other Active Problems  Tobacco abuse: snuff/chew - recommend cessation  CKD, Stage 3B  Hospital day # 0  Neurology will sign off. Please call with questions. Pt will follow up with Cecille Rubin, NP, at [facility] in about 6 weeks. Thanks for the consult.  Rosalin Hawking, MD PhD Stroke Neurology 02/08/2017 10:24 PM  To contact Stroke Continuity provider, please refer to [email or web address] After hours, contact General Neurology

## 2017-02-08 NOTE — Evaluation (Signed)
Physical Therapy Evaluation Patient Details Name: Kathryn Ortiz MRN: [id number] DOB: 1941-05-23 Today's Date: 02/08/2017   History of Present Illness  76 y.o. female presenting the hospital with left facial numbness and left-sided weakness. Patient was not a TPA candidate secondary to mild symptoms. Neuro workup underway  Clinical Impression  Orders received for PT evaluation. Patient demonstrates very modest deficits in functional mobility but reports near mobility baseline. At this time, no further acute PT needs. Will sign off.      Follow Up Recommendations No PT follow up    Equipment Recommendations  None recommended by PT    Recommendations for Other Services       Precautions / Restrictions Restrictions Weight Bearing Restrictions: No      Mobility  Bed Mobility Overal bed mobility: Modified Independent             General bed mobility comments: increased time, no physical assist or cues required  Transfers Overall transfer level: Modified independent Equipment used: None             General transfer comment: able to stand from bed and from toilet (with grab bar) no physical assist required  Ambulation/Gait Ambulation/Gait assistance: Supervision Ambulation Distance (Feet): 310 Feet Assistive device: None Gait Pattern/deviations: Drifts right/left;Decreased stride length;Narrow base of support;Step-through pattern Gait velocity: decreased Gait velocity interpretation: Below normal speed for age/gender General Gait Details: patient with some noted instability with increased ambulation and higher level tasks, no over LOB able to mobilize without assist or device. patient reports baseline LLE deficits from history of TKA  Stairs            Wheelchair Mobility    Modified Rankin (Stroke Patients Only) Modified Rankin (Stroke Patients Only) Pre-Morbid Rankin Score: No significant disability Modified Rankin: No significant disability      Balance Overall balance assessment: Needs assistance                           High level balance activites: Side stepping;Backward walking;Direction changes;Turns;Sudden stops;Head turns High Level Balance Comments: supervision for safety modest instability, no physical assist required             Pertinent Vitals/Pain Pain Assessment: 0-10 Pain Score: 3  Pain Location: head Pain Descriptors / Indicators: Headache Pain Intervention(s): Monitored during session    Home Living Family/patient expects to be discharged to:: Private residence Living Arrangements: Spouse/significant other Available Help at Discharge: Family;Available 24 hours/day Type of Home: House Home Access: Stairs to enter Entrance Stairs-Rails: Psychiatric nurse of Steps: 3 Home Layout: Two level;1/2 bath on main level        Prior Function Level of Independence: Independent               Hand Dominance   Dominant Hand: Right    Extremity/Trunk Assessment   Upper Extremity Assessment Upper Extremity Assessment: Defer to OT evaluation    Lower Extremity Assessment Lower Extremity Assessment: LLE deficits/detail LLE Deficits / Details: patient with some noted sensory changes LLE and slight asymetrical weakness compared to RLE.  LLE Sensation: decreased light touch LLE Coordination: decreased fine motor       Communication   Communication: No difficulties  Cognition Arousal/Alertness: Awake/alert Behavior During Therapy: WFL for tasks assessed/performed Overall Cognitive Status: Within Functional Limits for tasks assessed  General Comments      Exercises     Assessment/Plan    PT Assessment Patent does not need any further PT services  PT Problem List         PT Treatment Interventions      PT Goals (Current goals can be found in the Care Plan section)  Acute Rehab PT Goals PT Goal  Formulation: All assessment and education complete, DC therapy    Frequency     Barriers to discharge        Co-evaluation               AM-PAC PT "6 Clicks" Daily Activity  Outcome Measure Difficulty turning over in bed (including adjusting bedclothes, sheets and blankets)?: A Little Difficulty moving from lying on back to sitting on the side of the bed? : A Little Difficulty sitting down on and standing up from a chair with arms (e.g., wheelchair, bedside commode, etc,.)?: A Little Help needed moving to and from a bed to chair (including a wheelchair)?: A Little Help needed walking in hospital room?: A Little Help needed climbing 3-5 steps with a railing? : A Little 6 Click Score: 18    End of Session Equipment Utilized During Treatment: Gait belt Activity Tolerance: Patient tolerated treatment well Patient left: in bed;with call bell/phone within reach;with family/visitor present Nurse Communication: Mobility status PT Visit Diagnosis: Unsteadiness on feet (R26.81)    Time: 0131-4388 PT Time Calculation (min) (ACUTE ONLY): 20 min   Charges:   PT Evaluation $PT Eval Low Complexity: 1 Procedure     PT G Codes:   PT G-Codes **NOT FOR INPATIENT CLASS** Functional Assessment Tool Used: Clinical judgement Functional Limitation: Mobility: Walking and moving around Mobility: Walking and Moving Around Current Status (I7579): At least 1 percent but less than 20 percent impaired, limited or restricted Mobility: Walking and Moving Around Goal Status [phone]): At least 1 percent but less than 20 percent impaired, limited or restricted Mobility: Walking and Moving Around Discharge Status [phone]): At least 1 percent but less than 20 percent impaired, limited or restricted    Alben Deeds, PT DPT [facility] [place] 02/08/2017, 2:48 PM

## 2017-02-08 NOTE — Care Management Obs Status (Signed)
[place] NOTIFICATION   Patient Details  Name: Kathryn Ortiz MRN: [id number] Date of Birth: [birth year]   Medicare Observation Status Notification Given:  Yes    Bethena Roys, RN 02/08/2017, 4:55 PM

## 2017-02-08 NOTE — Progress Notes (Signed)
**  Preliminary report by tech**  Carotid artery duplex complete. Findings are consistent with a 1-39 percent stenosis involving the right internal carotid artery and the left internal carotid artery. The vertebral arteries demonstrate antegrade flow.  02/08/17 12:26 PM Carlos Levering RVT

## 2017-02-08 NOTE — Progress Notes (Signed)
PROGRESS NOTE    Kathryn Ortiz  KZS:[id number] DOB: Apr 18, 1941 DOA: 02/07/2017 PCP: Jani Gravel, MD   Brief Narrative: Kathryn Ortiz is a very pleasant 76 y.o. female with medical history significant syncope, stenosis of carotid artery, osteoarthritis, macular degeneration, hypertension, hyperlipidemia, breast cancer, anxiety presents to the emergency Department chief complaint of left-sided numbness and slurred speech.  Assessment & Plan:   Principal Problem:   Stroke-like symptoms Active Problems:   GERD (gastroesophageal reflux disease)   Hyperlipidemia   Hypertension   Anxiety   Breast cancer of upper-inner quadrant of right female breast (HCC)   CKD (chronic kidney disease), stage III   Meningioma (HCC)   Left facial numbness / slurred speech:  So far the stroke work up is negative.  Possibly TIAn meningioma is stable appearing.  2 d echo is pending.  LDL is 79 and A1c s pending.  Resume aspirin.  Neurology consulted and await recommendations.  Meanwhile pt reports generalized weakness, await therapy evaluations.  Get TSH.    Stage 3 CKD:  Creatinine is stable.    Hyperlipidemia:  Not on statin at home.    GERD: stable.   Permissive hypertension.    Anxiety:  Resume klonopin.      DVT prophylaxis: (Lovenox) Code Status: (Full/ Family Communication: husband at bedside.  Disposition Plan: pending echo results, possibly home in am after therapy eval.    Consultants:   Neurology.    Procedures: echocardiogram.   Antimicrobials: none.   Subjective Feels weak.   Objective: Vitals:   02/08/17 0530 02/08/17 1028 02/08/17 1325 02/08/17 1738  BP: (!) 148/50 (!) 142/63 (!) 148/44 (!) 146/51  Pulse: (!) 51 60 (!) 57 61  Resp: 18 18 18 18   Temp: 97.6 F (36.4 C) 98.7 F (37.1 C) 97.9 F (36.6 C) 98 F (36.7 C)  TempSrc: Oral Oral Oral Oral  SpO2: 100%  99% 98%  Weight:      Height:        Intake/Output Summary (Last 24 hours) at 02/08/17  1741 Last data filed at 02/08/17 1700  Gross per 24 hour  Intake              800 ml  Output                0 ml  Net              800 ml   Filed Weights   02/07/17 1400 02/07/17 1452  Weight: 66.4 kg (146 lb 6.2 oz) 66.4 kg (146 lb 6.2 oz)    Examination:  General exam: Appears calm and comfortable  Respiratory system: Clear to auscultation. Respiratory effort normal. Cardiovascular system: S1 & S2 heard, RRR. No JVD, murmurs, rubs, gallops or clicks. No pedal edema. Gastrointestinal system: Abdomen is nondistended, soft and nontender. No organomegaly or masses felt. Normal bowel sounds heard. Central nervous system: Alert and oriented. No focal neurological deficits. Extremities: Symmetric 5 x 5 power. Skin: No rashes, lesions or ulcers Psychiatry: Judgement and insight appear normal. Mood & affect appropriate.     Data Reviewed: I have personally reviewed following labs and imaging studies  CBC:  Recent Labs Lab 02/07/17 1435 02/07/17 1445  WBC 4.2  --   NEUTROABS 2.3  --   HGB 12.1 12.6  HCT 37.3 37.0  MCV 91.0  --   PLT 145*  --    Basic Metabolic Panel:  Recent Labs Lab 02/07/17 1435 02/07/17 1445 02/08/17 0535  NA  138 140 140  K 4.4 4.2 4.8  CL 106 104 109  CO2 24  --  26  GLUCOSE 110* 103* 85  BUN 10 11 11   CREATININE 1.39* 1.30* 1.40*  CALCIUM 9.0  --  8.4*   GFR: Estimated Creatinine Clearance: 28.3 mL/min (A) (by C-G formula based on SCr of 1.4 mg/dL (H)). Liver Function Tests:  Recent Labs Lab 02/07/17 1435  AST 27  ALT 13*  ALKPHOS 43  BILITOT 0.3  PROT 6.1*  ALBUMIN 3.7   No results for input(s): LIPASE, AMYLASE in the last 168 hours. No results for input(s): AMMONIA in the last 168 hours. Coagulation Profile:  Recent Labs Lab 02/07/17 1435  INR 1.01   Cardiac Enzymes: No results for input(s): CKTOTAL, CKMB, CKMBINDEX, TROPONINI in the last 168 hours. BNP (last 3 results) No results for input(s): PROBNP in the last 8760  hours. HbA1C:  Recent Labs  02/07/17 1435  HGBA1C 5.5   CBG: No results for input(s): GLUCAP in the last 168 hours. Lipid Profile:  Recent Labs  02/07/17 1435  CHOL 163  HDL 66  LDLCALC 79  TRIG 90  CHOLHDL 2.5   Thyroid Function Tests: No results for input(s): TSH, T4TOTAL, FREET4, T3FREE, THYROIDAB in the last 72 hours. Anemia Panel: No results for input(s): VITAMINB12, FOLATE, FERRITIN, TIBC, IRON, RETICCTPCT in the last 72 hours. Sepsis Labs: No results for input(s): PROCALCITON, LATICACIDVEN in the last 168 hours.  No results found for this or any previous visit (from the past 240 hour(s)).       Radiology Studies: Mr Brain 44 Contrast  Result Date: 02/07/2017 CLINICAL DATA:  76 y/o F; left-sided numbness, weakness, and slurred speech. EXAM: MRI HEAD WITHOUT CONTRAST MRA HEAD WITHOUT CONTRAST TECHNIQUE: Multiplanar, multiecho pulse sequences of the brain and surrounding structures were obtained without intravenous contrast. Angiographic images of the head were obtained using MRA technique without contrast. COMPARISON:  02/07/2017 CT of the head.  11/04/2015 MRI of the head. FINDINGS: MRI HEAD FINDINGS Brain: No diffusion restriction to suggest acute infarct. No abnormal susceptibility hypointensity to indicate intracranial hemorrhage. Few foci of T2 FLAIR hyperintense signal abnormality in subcortical and periventricular white matter compatible with mild chronic microvascular ischemic changes. Mild brain parenchymal volume loss. No hydrocephalus. No extra-axial collection is identified. Stable 13 x 13 x 19 mm left parafalcine extra-axial mass compatible with meningioma (series 7, image 17 and series 11, image 8). No edema in the overlying brain. Vascular: Normal flow voids. Skull and upper cervical spine: Normal marrow signal. Sinuses/Orbits: Small left maxillary sinus mucous retention cyst. Otherwise negative. Other: None. MRA HEAD FINDINGS Internal carotid arteries:   Patent. Anterior cerebral arteries:  Patent. Middle cerebral arteries: Patent. Anterior communicating artery: Patent. Posterior communicating arteries: Patent right. No left identified, likely hypoplastic or absent. Posterior cerebral arteries:  Patent. Basilar artery:  Patent. Vertebral arteries:  Patent. No evidence of high-grade stenosis, large vessel occlusion, or aneurysm. IMPRESSION: 1. No acute intracranial abnormality identified. 2. Mild chronic microvascular ischemic changes and mild parenchymal volume loss of the brain. 3. Stable left parafalcine parietal region meningioma measuring up to 19 mm. 4. Normal MRA of the head. Electronically Signed   By: Kristine Garbe M.D.   On: 02/07/2017 21:08   Dg Chest Port 1 View  Result Date: 02/07/2017 CLINICAL DATA:  Left-sided facial numbness EXAM: PORTABLE CHEST 1 VIEW COMPARISON:  10/29/2015 FINDINGS: There is no focal parenchymal opacity. There is no pleural effusion or pneumothorax. The heart mediastinum  are stable. There are surgical clips in the right axilla. The osseous structures are unremarkable. IMPRESSION: No active disease. Electronically Signed   By: Kathreen Devoid   On: 02/07/2017 16:41   Mr Jodene Nam Head/brain Wo Cm  Result Date: 02/07/2017 CLINICAL DATA:  76 y/o F; left-sided numbness, weakness, and slurred speech. EXAM: MRI HEAD WITHOUT CONTRAST MRA HEAD WITHOUT CONTRAST TECHNIQUE: Multiplanar, multiecho pulse sequences of the brain and surrounding structures were obtained without intravenous contrast. Angiographic images of the head were obtained using MRA technique without contrast. COMPARISON:  02/07/2017 CT of the head.  11/04/2015 MRI of the head. FINDINGS: MRI HEAD FINDINGS Brain: No diffusion restriction to suggest acute infarct. No abnormal susceptibility hypointensity to indicate intracranial hemorrhage. Few foci of T2 FLAIR hyperintense signal abnormality in subcortical and periventricular white matter compatible with mild chronic  microvascular ischemic changes. Mild brain parenchymal volume loss. No hydrocephalus. No extra-axial collection is identified. Stable 13 x 13 x 19 mm left parafalcine extra-axial mass compatible with meningioma (series 7, image 17 and series 11, image 8). No edema in the overlying brain. Vascular: Normal flow voids. Skull and upper cervical spine: Normal marrow signal. Sinuses/Orbits: Small left maxillary sinus mucous retention cyst. Otherwise negative. Other: None. MRA HEAD FINDINGS Internal carotid arteries:  Patent. Anterior cerebral arteries:  Patent. Middle cerebral arteries: Patent. Anterior communicating artery: Patent. Posterior communicating arteries: Patent right. No left identified, likely hypoplastic or absent. Posterior cerebral arteries:  Patent. Basilar artery:  Patent. Vertebral arteries:  Patent. No evidence of high-grade stenosis, large vessel occlusion, or aneurysm. IMPRESSION: 1. No acute intracranial abnormality identified. 2. Mild chronic microvascular ischemic changes and mild parenchymal volume loss of the brain. 3. Stable left parafalcine parietal region meningioma measuring up to 19 mm. 4. Normal MRA of the head. Electronically Signed   By: Kristine Garbe M.D.   On: 02/07/2017 21:08   Ct Head Code Stroke W/o Cm  Result Date: 02/07/2017 CLINICAL DATA:  Code stroke.  Code stroke.  Left facial numbness. EXAM: CT HEAD WITHOUT CONTRAST TECHNIQUE: Contiguous axial images were obtained from the base of the skull through the vertex without intravenous contrast. COMPARISON:  MRI 11/04/2015 FINDINGS: Brain: No sign of acute infarction. No significant small-vessel changes evident by CT. No intra-axial mass lesion, hemorrhage, hydrocephalus or extra-axial collection. 13 x 13 x 19 mm meningioma of the posterior falx, projecting more towards the left, is unchanged. Vascular: There is atherosclerotic calcification of the major vessels at the base of the brain. Skull: Normal Sinuses/Orbits:  Clear/normal Other: None significant ASPECTS ([place] Stroke Program Early CT Score) - Ganglionic level infarction (caudate, lentiform nuclei, internal capsule, insula, M1-M3 cortex): 7 - Supraganglionic infarction (M4-M6 cortex): 3 Total score (0-10 with 10 being normal): 10 IMPRESSION: 1. No acute finding. No ischemic changes. Chronic 13 x 13 x 19 mm meningioma of the posterior falx. 2. ASPECTS is 10. These results were called by telephone at the time of interpretation on 02/07/2017 at 2:50 pm to Dr. Leonel Ramsay, who verbally acknowledged these results. Electronically Signed   By: Nelson Chimes M.D.   On: 02/07/2017 14:52        Scheduled Meds: . aspirin EC  81 mg Oral Daily  . clonazePAM  0.5 mg Oral Daily  . enoxaparin (LOVENOX) injection  40 mg Subcutaneous Q24H  . gabapentin  300 mg Oral QHS  . HYDROcodone-acetaminophen  1 tablet Oral BID  . latanoprost  1 drop Both Eyes q morning - 10a  . pantoprazole  40  mg Oral Daily  . pravastatin  10 mg Oral q1800  . tamoxifen  20 mg Oral Daily  . traZODone  250 mg Oral QHS   Continuous Infusions:   LOS: 0 days    Time spent: 30 min.     Hosie Poisson, MD [facility] Pager [phone]  If 7PM-7AM, please contact night-coverage [email or web address] Password [facility] 02/08/2017, 5:41 PM

## 2017-02-08 NOTE — Progress Notes (Signed)
Rn offered to ambulate patient and resume AM care but patient refused at this time. Patient verbalized that she has a long weekend with family reunion and needed some good rest at this time.    Ave Filter, RN

## 2017-02-08 NOTE — Evaluation (Signed)
Occupational Therapy Evaluation and Discharge Patient Details Name: Kathryn Ortiz MRN: [id number] DOB: June 05, 1941 Today's Date: 02/08/2017    History of Present Illness 76 y.o. female presenting the hospital with left facial numbness and left-sided weakness. Patient was not a TPA candidate secondary to mild symptoms. Neuro workup underway   Clinical Impression   PTA Pt independent in ADL and mobility. Pt is currently at baseline. Pt able to perform ADL, visual testing, fine motor and gross motor for BUE. Pt still continuing to experience mild sensory deficits in LUE (hand) but hand able to perform functionally during session. Pt with no further questions or concerns at the end of session. Pt will have 24 hour supervision from husband. OT to sign off at this time, thank you for the referral.     Follow Up Recommendations  No OT follow up;Supervision - Intermittent    Equipment Recommendations  None recommended by OT    Recommendations for Other Services       Precautions / Restrictions Restrictions Weight Bearing Restrictions: No      Mobility Bed Mobility Overal bed mobility: Modified Independent             General bed mobility comments: increased time, no physical assist or cues required  Transfers Overall transfer level: Modified independent Equipment used: None             General transfer comment: able to stand from bed and from toilet (with grab bar) no physical assist required    Balance Overall balance assessment: Needs assistance                           High level balance activites: Side stepping;Backward walking;Direction changes;Turns;Sudden stops;Head turns High Level Balance Comments: supervision for safety modest instability, no physical assist required           ADL either performed or assessed with clinical judgement   ADL Overall ADL's : Needs assistance/impaired                                        General ADL Comments: Generally supervision for ADL, no physical assist needed during session     Vision Baseline Vision/History: Wears glasses;Glaucoma Wears Glasses: Reading only Patient Visual Report: No change from baseline Vision Assessment?: Yes Additional Comments: Pt able to read from book, find objects around the room, track therapist within functional setting with no visual deficits     Perception     Praxis      Pertinent Vitals/Pain Pain Assessment: Faces Pain Score: 3  Faces Pain Scale: Hurts a little bit Pain Location: head Pain Descriptors / Indicators: Headache Pain Intervention(s): Repositioned;Monitored during session     Hand Dominance Right   Extremity/Trunk Assessment Upper Extremity Assessment Upper Extremity Assessment: LUE deficits/detail LUE Deficits / Details: grossly 4+/5 strength - WFL with generalized weakness; able to perform finger dexterity and coordination exercises LUE Sensation: decreased light touch (in Left Hand)   Lower Extremity Assessment Lower Extremity Assessment: LLE deficits/detail LLE Deficits / Details: patient with some noted sensory changes LLE and slight asymetrical weakness compared to RLE.  LLE Sensation: decreased light touch LLE Coordination: decreased fine motor   Cervical / Trunk Assessment Cervical / Trunk Assessment: Kyphotic   Communication Communication Communication: No difficulties   Cognition Arousal/Alertness: Awake/alert Behavior During Therapy: WFL for tasks assessed/performed Overall Cognitive Status:  Within Functional Limits for tasks assessed                                     General Comments  Pt's husband present for first half of session    Exercises     Shoulder Instructions      Home Living Family/patient expects to be discharged to:: Private residence Living Arrangements: Spouse/significant other Available Help at Discharge: Family;Available 24 hours/day Type of  Home: House Home Access: Stairs to enter [employer] of Steps: 3 Entrance Stairs-Rails: Right;Left Home Layout: Two level;1/2 bath on main level Alternate Level Stairs-Number of Steps: 14 Alternate Level Stairs-Rails: Left Bathroom Shower/Tub: Teacher, early years/pre: Standard     Home Equipment: Crutches;Walker - 2 wheels          Prior Functioning/Environment Level of Independence: Independent                 OT Problem List: Impaired sensation;Pain      OT Treatment/Interventions:      OT Goals(Current goals can be found in the care plan section) Acute Rehab OT Goals Patient Stated Goal: to get home OT Goal Formulation: With patient Time For Goal Achievement: 02/21/17 Potential to Achieve Goals: Good  OT Frequency:     Barriers to D/C:            Co-evaluation              AM-PAC PT "6 Clicks" Daily Activity     Outcome Measure Help from another person eating meals?: None Help from another person taking care of personal grooming?: None Help from another person toileting, which includes using toliet, bedpan, or urinal?: None Help from another person bathing (including washing, rinsing, drying)?: A Little Help from another person to put on and taking off regular upper body clothing?: None Help from another person to put on and taking off regular lower body clothing?: None 6 Click Score: 23   End of Session Nurse Communication: Mobility status  Activity Tolerance: Patient tolerated treatment well Patient left: in bed;with call bell/phone within reach;with bed alarm set  OT Visit Diagnosis: Other symptoms and signs involving the nervous system (R29.898)                Time: 0786-7544 OT Time Calculation (min): 22 min Charges:  OT General Charges $OT Visit: 1 Procedure OT Evaluation $OT Eval Low Complexity: 1 Procedure G-Codes: OT G-codes **NOT FOR INPATIENT CLASS** Functional Assessment Tool Used: AM-PAC 6 Clicks Daily  Activity Functional Limitation: Self care Self Care Current Status (B2010): At least 1 percent but less than 20 percent impaired, limited or restricted Self Care Goal Status (O7121): 0 percent impaired, limited or restricted Self Care Discharge Status [phone]): At least 1 percent but less than 20 percent impaired, limited or restricted   Hulda Humphrey OTR/[facility] [place] 02/08/2017, 3:39 PM

## 2017-02-09 ENCOUNTER — Observation Stay (HOSPITAL_COMMUNITY): Payer: Medicare Other

## 2017-02-09 ENCOUNTER — Observation Stay (HOSPITAL_BASED_OUTPATIENT_CLINIC_OR_DEPARTMENT_OTHER): Payer: Medicare Other

## 2017-02-09 DIAGNOSIS — D329 Benign neoplasm of meninges, unspecified: Secondary | ICD-10-CM | POA: Diagnosis not present

## 2017-02-09 DIAGNOSIS — N183 Chronic kidney disease, stage 3 (moderate): Secondary | ICD-10-CM | POA: Diagnosis not present

## 2017-02-09 DIAGNOSIS — I10 Essential (primary) hypertension: Secondary | ICD-10-CM | POA: Diagnosis not present

## 2017-02-09 DIAGNOSIS — G459 Transient cerebral ischemic attack, unspecified: Secondary | ICD-10-CM

## 2017-02-09 DIAGNOSIS — H538 Other visual disturbances: Secondary | ICD-10-CM | POA: Diagnosis not present

## 2017-02-09 DIAGNOSIS — I361 Nonrheumatic tricuspid (valve) insufficiency: Secondary | ICD-10-CM

## 2017-02-09 DIAGNOSIS — R2990 Unspecified symptoms and signs involving the nervous system: Secondary | ICD-10-CM | POA: Diagnosis not present

## 2017-02-09 LAB — ECHOCARDIOGRAM COMPLETE
CHL CUP MV DEC (S): 324
E decel time: 324 msec
EERAT: 19.59
FS: 38 % (ref 28–44)
HEIGHTINCHES: 59 in
IVS/LV PW RATIO, ED: 1.15
LA vol index: 30.4 mL/m2
LA vol: 49.3 mL
LADIAMINDEX: 1.98 cm/m2
LASIZE: 32 mm
LAVOLA4C: 37.5 mL
LDCA: 2.54 cm2
LEFT ATRIUM END SYS DIAM: 32 mm
LV E/e' medial: 19.59
LV TDI E'MEDIAL: 5.44
LV e' LATERAL: 5.87 cm/s
LVEEAVG: 19.59
LVOT MV VTI INDEX: 0.88 cm2/m2
LVOT MV VTI: 1.43
LVOT SV: 64 mL
LVOT VTI: 25.1 cm
LVOT diameter: 18 mm
LVOT peak vel: 98.9 cm/s
Lateral S' vel: 9.14 cm/s
MV M vel: 79.4
MV pk A vel: 150 m/s
MV pk E vel: 115 m/s
MVANNULUSVTI: 44.5 cm
MVPG: 5 mmHg
Mean grad: 4 mmHg
PV Reg grad dias: 5 mmHg
PV Reg vel dias: 117 cm/s
PW: 10.7 mm — AB (ref 0.6–1.1)
RV sys press: 30 mmHg
Reg peak vel: 261 cm/s
TAPSE: 21.5 mm
TDI e' lateral: 5.87
TRMAXVEL: 261 cm/s
WEIGHTICAEL: 2342.17 [oz_av]

## 2017-02-09 MED ORDER — PRAVASTATIN SODIUM 10 MG PO TABS: 10.0000 mg | ORAL_TABLET | Freq: Every day | ORAL | 0 refills | Status: AC

## 2017-02-09 MED ORDER — ENOXAPARIN SODIUM 30 MG/0.3ML ~~LOC~~ SOLN: 30.0000 mg | SUBCUTANEOUS | Status: DC

## 2017-02-09 MED ORDER — ASPIRIN 81 MG PO TBEC: 81.0000 mg | DELAYED_RELEASE_TABLET | Freq: Every day | ORAL | 0 refills | Status: DC

## 2017-02-09 NOTE — Progress Notes (Signed)
Patient is c/o blurred vision and numbness/tingling in her leff hand. Both attending MD and Stroke team paged.  Ave Filter, RN

## 2017-02-09 NOTE — Progress Notes (Signed)
  Echocardiogram 2D Echocardiogram has been performed.  Sevon Rotert T Avyn Aden 02/09/2017, 9:20 AM

## 2017-02-09 NOTE — Discharge Summary (Signed)
Physician Discharge Summary  [name] EQA:[id number] DOB: 06/14/1941 DOA: 02/07/2017  PCP: Jani Gravel, MD  Admit date: 02/07/2017 Discharge date: 02/09/2017  Admitted From: Home.  Disposition:  Home.   Recommendations for Outpatient Follow-up:  1. Follow up with PCP in 1-2 weeks 2. Please obtain BMP/CBC in one week Please follow up with neurology for outpatient EMG/ Nerve conduction study.   Discharge Condition: stable.  CODE STATUS:full code.  Diet recommendation: Heart Healthy  Brief/Interim Summary:Kathryn J Huntis a very pleasant 76 y.o.femalewith medical history significant syncope, stenosis of carotid artery, osteoarthritis, macular degeneration, hypertension, hyperlipidemia, breast cancer, anxiety presents to the emergency Department chief complaint of left-sided numbness and slurred speech. Neurology consulted and stroke work up initiated.   Discharge Diagnoses:  Principal Problem:   Stroke-like symptoms Active Problems:   GERD (gastroesophageal reflux disease)   Hyperlipidemia   Hypertension   Anxiety   Breast cancer of upper-inner quadrant of right female breast (HCC)   CKD (chronic kidney disease), stage III   Meningioma (HCC)   Left facial numbness / slurred speech:  So far the stroke work up is negative.  Possibly TIA, meningioma is stable appearing.  2 d echo  Shows There was mild concentric hypertrophy. Systolic function was normal. The estimated ejection fraction was in the range of 60% to 65%. Wall  motion was normal; there were no regional wall motion abnormalities. Doppler parameters are consistent with abnormal  left ventricular relaxation (grade 1 diastolic dysfunction).  Doppler parameters are consistent with high ventricular filling pressure. LDL is 79 and A1c is 5.5 Resume aspirin 81 mg daily on discharge and pravachol added daily. .  Neurology consulted and recommended outpatient follow up for EMG / nerve conduction study for the numbness of the  left hand and facial numbness.     Stage 3 CKD:  Creatinine is stable.    Hyperlipidemia:  Not on statin at home. Added pravachol.    GERD: stable.   Hypertension.  not on meds. bp parameters have been stable.   Anxiety:  Resume klonopin.      Discharge Instructions  Discharge Instructions    Diet - low sodium heart healthy    Complete by:  As directed    Discharge instructions    Complete by:  As directed    Please follow up with PCP in 1 to 2 weeks, post hospitalization visit. PLEASE follow up with neurologist as outpatient for EMG nerve conduction study for her tingling and numbness.     Allergies as of 02/09/2017      Reactions   Penicillins Hives, Itching, Other (See Comments)   Welps, burning and makes patient sick. Pt. States she had problems breathing. Arms swelling. Tolerated Ancef pre-op 04/14/15   Celexa [citalopram] Other (See Comments)   Felt bad      Medication List    STOP taking these medications   lovastatin 10 MG tablet Commonly known as:  MEVACOR Replaced by:  pravastatin 10 MG tablet     TAKE these medications   aspirin 81 MG EC tablet Take 1 tablet (81 mg total) by mouth daily.   citalopram 10 MG tablet Commonly known as:  CELEXA Take 10 mg by mouth daily.   clonazePAM 1 MG tablet Commonly known as:  KLONOPIN Take 0.5 mg by mouth daily as needed for anxiety.   gabapentin 300 MG capsule Commonly known as:  NEURONTIN Take 300 mg by mouth at bedtime.   HYDROcodone-acetaminophen 7.5-325 MG tablet Commonly known as:  NORCO Take 1 tablet by mouth 2 (two) times daily.   latanoprost 0.005 % ophthalmic solution Commonly known as:  XALATAN Place 1 drop into both eyes every morning.   omeprazole 20 MG capsule Commonly known as:  PRILOSEC Take 20 mg by mouth daily.   pravastatin 10 MG tablet Commonly known as:  PRAVACHOL Take 1 tablet (10 mg total) by mouth daily at 6 PM. Replaces:  lovastatin 10 MG tablet    tamoxifen 20 MG tablet Commonly known as:  NOLVADEX TAKE 1 TABLET BY MOUTH  DAILY   traZODone 100 MG tablet Commonly known as:  DESYREL Take 250 mg by mouth at bedtime. Reported on 12/16/2015   VITAMIN B12 PO Take 1 tablet by mouth daily. Reported on 10/22/2015   VITAMIN D PO Take 1 tablet by mouth daily.      Follow-up Information    Dennie Bible, NP. Schedule an appointment as soon as possible for a visit in 6 week(s).   Specialty:  Family Medicine Why:  will need nerve conduction study/EMG for her tingling and numbness.  Contact information: [address] [place] [address] [phone]        Jani Gravel, MD. Schedule an appointment as soon as possible for a visit in 1 week(s).   Specialty:  Internal Medicine Contact information: [place] [address] [phone]          Allergies  Allergen Reactions  . Penicillins Hives, Itching and Other (See Comments)    Welps, burning and makes patient sick. Pt. States she had problems breathing. Arms swelling. Tolerated Ancef pre-op 04/14/15  . Celexa [Citalopram] Other (See Comments)    Felt bad    Consultations: Neurology.  Procedures/Studies: Mr Brain Wo Contrast  Result Date: 02/07/2017 CLINICAL DATA:  76 y/o F; left-sided numbness, weakness, and slurred speech. EXAM: MRI HEAD WITHOUT CONTRAST MRA HEAD WITHOUT CONTRAST TECHNIQUE: Multiplanar, multiecho pulse sequences of the brain and surrounding structures were obtained without intravenous contrast. Angiographic images of the head were obtained using MRA technique without contrast. COMPARISON:  02/07/2017 CT of the head.  11/04/2015 MRI of the head. FINDINGS: MRI HEAD FINDINGS Brain: No diffusion restriction to suggest acute infarct. No abnormal susceptibility hypointensity to indicate intracranial hemorrhage. Few foci of T2 FLAIR hyperintense signal abnormality in subcortical and periventricular white matter  compatible with mild chronic microvascular ischemic changes. Mild brain parenchymal volume loss. No hydrocephalus. No extra-axial collection is identified. Stable 13 x 13 x 19 mm left parafalcine extra-axial mass compatible with meningioma (series 7, image 17 and series 11, image 8). No edema in the overlying brain. Vascular: Normal flow voids. Skull and upper cervical spine: Normal marrow signal. Sinuses/Orbits: Small left maxillary sinus mucous retention cyst. Otherwise negative. Other: None. MRA HEAD FINDINGS Internal carotid arteries:  Patent. Anterior cerebral arteries:  Patent. Middle cerebral arteries: Patent. Anterior communicating artery: Patent. Posterior communicating arteries: Patent right. No left identified, likely hypoplastic or absent. Posterior cerebral arteries:  Patent. Basilar artery:  Patent. Vertebral arteries:  Patent. No evidence of high-grade stenosis, large vessel occlusion, or aneurysm. IMPRESSION: 1. No acute intracranial abnormality identified. 2. Mild chronic microvascular ischemic changes and mild parenchymal volume loss of the brain. 3. Stable left parafalcine parietal region meningioma measuring up to 19 mm. 4. Normal MRA of the head. Electronically Signed   By: Kristine Garbe M.D.   On: 02/07/2017 21:08   Dg Chest Port 1 View  Result Date: 02/07/2017 CLINICAL DATA:  Left-sided facial numbness EXAM: PORTABLE CHEST 1 VIEW COMPARISON:  10/29/2015 FINDINGS: There is no focal parenchymal opacity. There is no pleural effusion or pneumothorax. The heart mediastinum are stable. There are surgical clips in the right axilla. The osseous structures are unremarkable. IMPRESSION: No active disease. Electronically Signed   By: Kathreen Devoid   On: 02/07/2017 16:41   Mr Brain Limited Wo Contrast  Result Date: 02/09/2017 CLINICAL DATA:  76 year old female with new onset blurred/double vision. Limited diffusion-weighted imaging Brain MRI requested by Neurology. EXAM: LIMITED MRI HEAD  WITHOUT CONTRAST TECHNIQUE: Diffusion weighted imaging and FLAIR sequences of the brain and surrounding structures were obtained without intravenous contrast. COMPARISON:  Brain MRI and intracranial MRA 02/07/2017, and earlier FINDINGS: No restricted diffusion or evidence of acute infarction. Round left posterior parafalcine meningioma with increased trace diffusion signal re - demonstrated. Cerebral morphology is stable. No ventriculomegaly. No intracranial mass effect. No abnormal intracranial vascular increased FLAIR signal is evident. Stable FLAIR signal in the brain. IMPRESSION: No evidence of acute infarct. Stable limited MRI appearance the brain. Electronically Signed   By: Genevie Ann M.D.   On: 02/09/2017 14:15   Mr Jodene Nam Head/brain MW Cm  Result Date: 02/07/2017 CLINICAL DATA:  76 y/o F; left-sided numbness, weakness, and slurred speech. EXAM: MRI HEAD WITHOUT CONTRAST MRA HEAD WITHOUT CONTRAST TECHNIQUE: Multiplanar, multiecho pulse sequences of the brain and surrounding structures were obtained without intravenous contrast. Angiographic images of the head were obtained using MRA technique without contrast. COMPARISON:  02/07/2017 CT of the head.  11/04/2015 MRI of the head. FINDINGS: MRI HEAD FINDINGS Brain: No diffusion restriction to suggest acute infarct. No abnormal susceptibility hypointensity to indicate intracranial hemorrhage. Few foci of T2 FLAIR hyperintense signal abnormality in subcortical and periventricular white matter compatible with mild chronic microvascular ischemic changes. Mild brain parenchymal volume loss. No hydrocephalus. No extra-axial collection is identified. Stable 13 x 13 x 19 mm left parafalcine extra-axial mass compatible with meningioma (series 7, image 17 and series 11, image 8). No edema in the overlying brain. Vascular: Normal flow voids. Skull and upper cervical spine: Normal marrow signal. Sinuses/Orbits: Small left maxillary sinus mucous retention cyst. Otherwise  negative. Other: None. MRA HEAD FINDINGS Internal carotid arteries:  Patent. Anterior cerebral arteries:  Patent. Middle cerebral arteries: Patent. Anterior communicating artery: Patent. Posterior communicating arteries: Patent right. No left identified, likely hypoplastic or absent. Posterior cerebral arteries:  Patent. Basilar artery:  Patent. Vertebral arteries:  Patent. No evidence of high-grade stenosis, large vessel occlusion, or aneurysm. IMPRESSION: 1. No acute intracranial abnormality identified. 2. Mild chronic microvascular ischemic changes and mild parenchymal volume loss of the brain. 3. Stable left parafalcine parietal region meningioma measuring up to 19 mm. 4. Normal MRA of the head. Electronically Signed   By: Kristine Garbe M.D.   On: 02/07/2017 21:08   Ct Head Code Stroke W/o Cm  Result Date: 02/07/2017 CLINICAL DATA:  Code stroke.  Code stroke.  Left facial numbness. EXAM: CT HEAD WITHOUT CONTRAST TECHNIQUE: Contiguous axial images were obtained from the base of the skull through the vertex without intravenous contrast. COMPARISON:  MRI 11/04/2015 FINDINGS: Brain: No sign of acute infarction. No significant small-vessel changes evident by CT. No intra-axial mass lesion, hemorrhage, hydrocephalus or extra-axial collection. 13 x 13 x 19 mm meningioma of the posterior falx, projecting more towards the left, is unchanged. Vascular: There is atherosclerotic calcification of the major vessels at the base of the brain. Skull: Normal Sinuses/Orbits: Clear/normal Other: None significant  ASPECTS [facility] Stroke Program Early CT Score) - Ganglionic level infarction (caudate, lentiform nuclei, internal capsule, insula, M1-M3 cortex): 7 - Supraganglionic infarction (M4-M6 cortex): 3 Total score (0-10 with 10 being normal): 10 IMPRESSION: 1. No acute finding. No ischemic changes. Chronic 13 x 13 x 19 mm meningioma of the posterior falx. 2. ASPECTS is 10. These results were called by telephone at  the time of interpretation on 02/07/2017 at 2:50 pm to Dr. Leonel Ramsay, who verbally acknowledged these results. Electronically Signed   By: Nelson Chimes M.D.   On: 02/07/2017 14:52   Echocardiogram    Subjective: No new complaints.   Discharge Exam: Vitals:   02/09/17 1201 02/09/17 1437  BP: (!) 137/45 (!) 127/57  Pulse: 64 71  Resp: 18 18  Temp:  98 F (36.7 C)   Vitals:   02/09/17 0612 02/09/17 0930 02/09/17 1201 02/09/17 1437  BP: (!) 122/48 117/61 (!) 137/45 (!) 127/57  Pulse: (!) 55 64 64 71  Resp: 16 18 18 18   Temp: 97.9 F (36.6 C) 98.1 F (36.7 C)  98 F (36.7 C)  TempSrc: Oral Oral  Oral  SpO2: 98% 97% 100% 100%  Weight:      Height:        General: Pt is alert, awake, not in acute distress Cardiovascular: RRR, S1/S2 +, no rubs, no gallops Respiratory: CTA bilaterally, no wheezing, no rhonchi Abdominal: Soft, NT, ND, bowel sounds + Extremities: no edema, no cyanosis    The results of significant diagnostics from this hospitalization (including imaging, microbiology, ancillary and laboratory) are listed below for reference.     Microbiology: No results found for this or any previous visit (from the past 240 hour(s)).   Labs: BNP (last 3 results) No results for input(s): BNP in the last 8760 hours. Basic Metabolic Panel:  Recent Labs Lab 02/07/17 1435 02/07/17 1445 02/08/17 0535  NA 138 140 140  K 4.4 4.2 4.8  CL 106 104 109  CO2 24  --  26  GLUCOSE 110* 103* 85  BUN 10 11 11   CREATININE 1.39* 1.30* 1.40*  CALCIUM 9.0  --  8.4*   Liver Function Tests:  Recent Labs Lab 02/07/17 1435  AST 27  ALT 13*  ALKPHOS 43  BILITOT 0.3  PROT 6.1*  ALBUMIN 3.7   No results for input(s): LIPASE, AMYLASE in the last 168 hours. No results for input(s): AMMONIA in the last 168 hours. CBC:  Recent Labs Lab 02/07/17 1435 02/07/17 1445  WBC 4.2  --   NEUTROABS 2.3  --   HGB 12.1 12.6  HCT 37.3 37.0  MCV 91.0  --   PLT 145*  --    Cardiac  Enzymes: No results for input(s): CKTOTAL, CKMB, CKMBINDEX, TROPONINI in the last 168 hours. BNP: Invalid input(s): POCBNP CBG: No results for input(s): GLUCAP in the last 168 hours. D-Dimer No results for input(s): DDIMER in the last 72 hours. Hgb A1c  Recent Labs  02/07/17 1435  HGBA1C 5.5   Lipid Profile  Recent Labs  02/07/17 1435  CHOL 163  HDL 66  LDLCALC 79  TRIG 90  CHOLHDL 2.5   Thyroid function studies No results for input(s): TSH, T4TOTAL, T3FREE, THYROIDAB in the last 72 hours.  Invalid input(s): FREET3 Anemia work up No results for input(s): VITAMINB12, FOLATE, FERRITIN, TIBC, IRON, RETICCTPCT in the last 72 hours. Urinalysis    Component Value Date/Time   COLORURINE YELLOW 10/20/2016 1915   APPEARANCEUR HAZY (A) 10/20/2016 1915  LABSPEC 1.018 10/20/2016 1915   PHURINE 5.0 10/20/2016 1915   GLUCOSEU NEGATIVE 10/20/2016 1915   HGBUR SMALL (A) 10/20/2016 1915   BILIRUBINUR NEGATIVE 10/20/2016 [place] 10/20/2016 1915   PROTEINUR 30 (A) 10/20/2016 1915   UROBILINOGEN 0.2 05/30/2008 2222   NITRITE NEGATIVE 10/20/2016 1915   LEUKOCYTESUR NEGATIVE 10/20/2016 1915   Sepsis Labs Invalid input(s): PROCALCITONIN,  WBC,  LACTICIDVEN Microbiology No results found for this or any previous visit (from the past 240 hour(s)).   Time coordinating discharge: Over 30 minutes  SIGNED:   Hosie Poisson, MD  [facility] 02/10/2017, 8:36 AM Pager   If 7PM-7AM, please contact night-coverage [email or web address] Password [email or web address]

## 2017-02-09 NOTE — Evaluation (Signed)
Speech Language Pathology Evaluation Patient Details Name: PAYSON CRUMBY MRN: [id number] DOB: January 05, 1941 Today's Date: 02/09/2017 Time: 5093-2671 SLP Time Calculation (min) (ACUTE ONLY): 29 min  Problem List:  Patient Active Problem List   Diagnosis Date Noted  . Stroke-like symptoms 02/07/2017  . Meningioma ([place])   . Influenza B   . Leukopenia 10/20/2016  . Thrombocytopenia ([place]) 10/20/2016  . AKI (acute kidney injury) ([place]) 10/20/2016  . CKD (chronic kidney disease), stage III 10/20/2016  . Influenza-like illness 10/20/2016  . Breast cancer of upper-inner quadrant of right female breast ([place]) 08/10/2015  . Postoperative anemia due to acute blood loss 04/22/2015  . GERD (gastroesophageal reflux disease)   . Hyperlipidemia   . Hypertension   . Anxiety   . Depression   . S/P total knee arthroplasty   . Osteoarthritis of right knee, primary localized 04/14/2015  . Knee osteoarthritis 04/14/2015  . Syncope 02/23/2010   Past Medical History:  Past Medical History:  Diagnosis Date  . Anxiety    takes Clonazepam daily as needed  . Arthritis   . Back pain    from knee pain  . Breast cancer ([place])    Right breast  . Depression    takes Celexa daily  . GERD (gastroesophageal reflux disease)    takes Omeprazole daily  . Hyperlipidemia    takes Lovastatin 2 times a week  . Hypertension    was on meds but has been off x 2 yr  . Insomnia    takes Trazodone nightly  . Joint pain   . Joint swelling   . Macular degeneration    dry   . Osteoarthritis of right knee, primary localized 04/14/2015  . S/P total knee arthroplasty    R  . Sciatica   . Stenosis of carotid artery 10-27-15   R & L  . Syncope 11-13-15  . Urinary frequency   . Weakness    tingling in hands and feet   Past Surgical History:  Past Surgical History:  Procedure Laterality Date  . BREAST EXCISIONAL BIOPSY     left   . BREAST LUMPECTOMY     October 01 2015 right  . BREAST LUMPECTOMY WITH RADIOACTIVE SEED  AND SENTINEL LYMPH NODE BIOPSY Right 09/28/2015   Procedure: BREAST LUMPECTOMY WITH RADIOACTIVE SEED AND SENTINEL LYMPH NODE BIOPSY;  Surgeon: Autumn Messing III, MD;  Location: [facility];  Service: General;  Laterality: Right;  . BREAST SURGERY     breast biopsy  . cataract surgery Bilateral   . COLONOSCOPY    . epidural injections    . SHOULDER SURGERY Bilateral   . TOTAL KNEE ARTHROPLASTY Right 01/12/2015  . TOTAL KNEE ARTHROPLASTY Right 04/14/2015   Procedure: TOTAL KNEE ARTHROPLASTY;  Surgeon: Marchia Bond, MD;  Location: [facility];  Service: Orthopedics;  Laterality: Right;   HPI:  76yo female admitted 02/07/17 with stroke like symptoms. PMH significant for GERD, arthritis, breast CA, depression/anxiety, carotid artery stenosis. MRI negative.   Assessment / Plan / Recommendation Clinical Impression  Prior to evaluation, pt reported new onset of blurred/double vision. RN informed. The Montreal Cognitive Assessment (MoCA) was administered. Pt scored 26/30 (n=26+/30) indicating performance within functional limits for 76 and level of education (high school graduate). Pt was encouraged to notify PCP if difficulty is noted after return to normal routine. No swallowing difficulty observed or reported. No further ST intervention recommended at this time. Please reconsult if needs arise.     SLP Assessment  SLP Recommendation/Assessment: Patient does not need any further Speech Language Pathology Services SLP Visit Diagnosis: Cognitive communication deficit (R41.841)    Follow Up Recommendations  None          SLP Evaluation Cognition  Overall Cognitive Status: Within Functional Limits for tasks assessed Orientation Level: Oriented X4 Memory: Appears intact Awareness: Appears intact Problem Solving: Appears intact Safety/Judgment: Appears intact       Comprehension  Auditory Comprehension Overall Auditory Comprehension: Appears within functional limits for tasks  assessed Reading Comprehension Reading Status:  (pt reports double/blurred vision. RN/MD aware)    Expression Expression Primary Mode of Expression: Verbal Verbal Expression Overall Verbal Expression: Appears within functional limits for tasks assessed Written Expression Dominant Hand: Right   Oral / Motor  Oral Motor/Sensory Function Overall Oral Motor/Sensory Function: Within functional limits Motor Speech Overall Motor Speech: Appears within functional limits for tasks assessed   GO          Functional Assessment Tool Used: asha noms, clinical judgment, MoCA Functional Limitations: Memory Memory Current Status (H8527): At least 1 percent but less than 20 percent impaired, limited or restricted Memory Goal Status (P8242): At least 1 percent but less than 20 percent impaired, limited or restricted Memory Discharge Status [phone]): At least 1 percent but less than 20 percent impaired, limited or restricted         Laurianne Floresca B. [place], [facility], [place]  Shonna Chock 02/09/2017, 12:19 PM

## 2017-02-09 NOTE — Progress Notes (Signed)
Discharge instructions reviewed with patient/family. All questions answered at this time. Transport home by family.   Shavana Calder, RN 

## 2017-02-20 ENCOUNTER — Other Ambulatory Visit: Payer: Medicare Other

## 2017-03-07 DIAGNOSIS — H35363 Drusen (degenerative) of macula, bilateral: Secondary | ICD-10-CM | POA: Diagnosis not present

## 2017-03-07 DIAGNOSIS — H35371 Puckering of macula, right eye: Secondary | ICD-10-CM | POA: Diagnosis not present

## 2017-03-07 DIAGNOSIS — H18413 Arcus senilis, bilateral: Secondary | ICD-10-CM | POA: Diagnosis not present

## 2017-03-07 DIAGNOSIS — H401131 Primary open-angle glaucoma, bilateral, mild stage: Secondary | ICD-10-CM | POA: Diagnosis not present

## 2017-03-07 DIAGNOSIS — H35033 Hypertensive retinopathy, bilateral: Secondary | ICD-10-CM | POA: Diagnosis not present

## 2017-03-09 DIAGNOSIS — Z09 Encounter for follow-up examination after completed treatment for conditions other than malignant neoplasm: Secondary | ICD-10-CM | POA: Diagnosis not present

## 2017-05-02 DIAGNOSIS — M25562 Pain in left knee: Secondary | ICD-10-CM | POA: Diagnosis not present

## 2017-05-02 DIAGNOSIS — Z23 Encounter for immunization: Secondary | ICD-10-CM | POA: Diagnosis not present

## 2017-05-05 DIAGNOSIS — M179 Osteoarthritis of knee, unspecified: Secondary | ICD-10-CM | POA: Diagnosis not present

## 2017-05-05 DIAGNOSIS — M25562 Pain in left knee: Secondary | ICD-10-CM | POA: Diagnosis not present

## 2017-05-05 DIAGNOSIS — M1712 Unilateral primary osteoarthritis, left knee: Secondary | ICD-10-CM | POA: Diagnosis not present

## 2017-05-20 IMAGING — US US BREAST LTD UNI RIGHT INC AXILLA
1 series · 13 of 20 positions shown · non-contrast
Comparison: Previous exam(s).

CLINICAL DATA: Mass felt by the patient in the upper inner right
breast for the past 3 months.

EXAM:
DIGITAL DIAGNOSTIC BILATERAL MAMMOGRAM WITH 3D TOMOSYNTHESIS WITH
CAD
ULTRASOUND RIGHT BREAST

[Series 1: us breast ltd uni right inc axilla · 0.07mm/px · 13 of 20 slices shown]
[im 1/20]
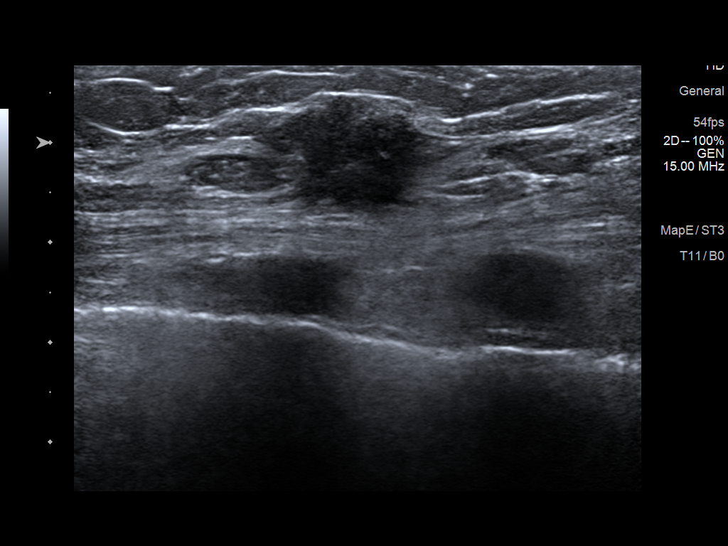
[im 3/20]
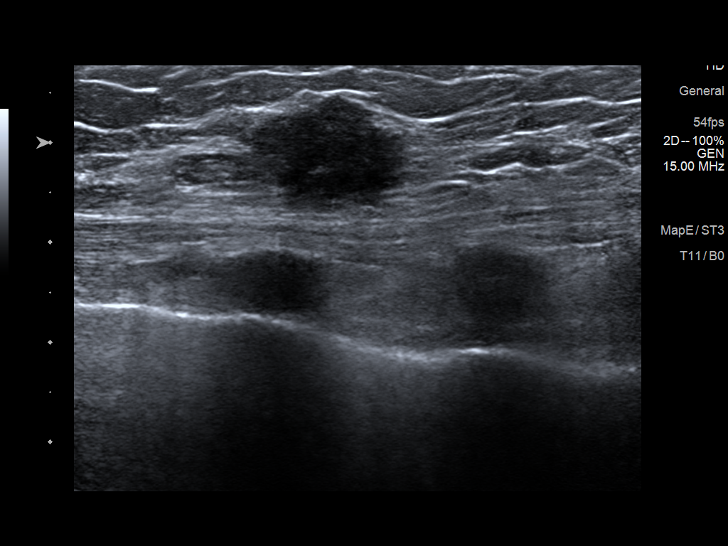
[im 4/20]
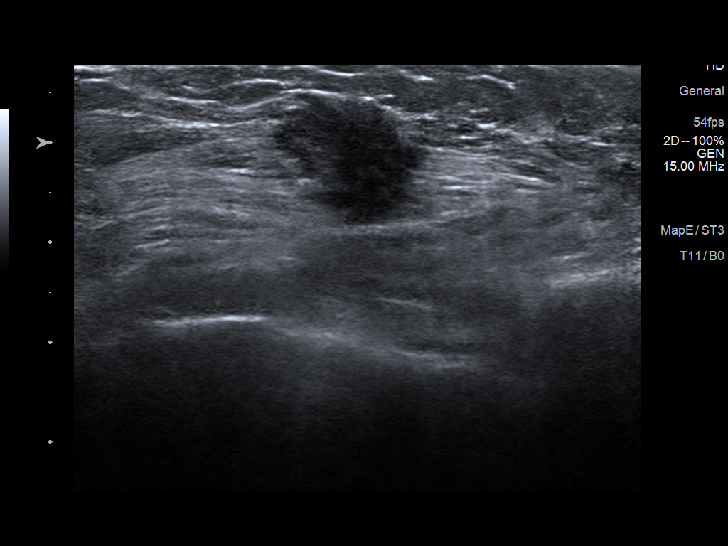
[im 6/20]
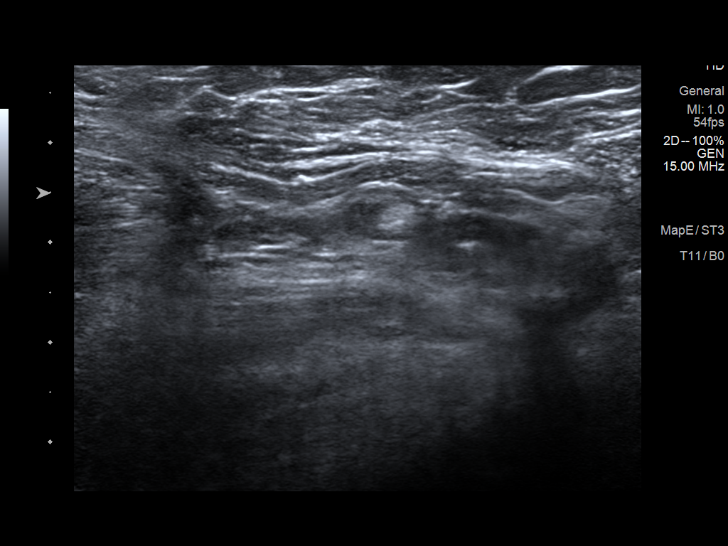
[im 7/20]
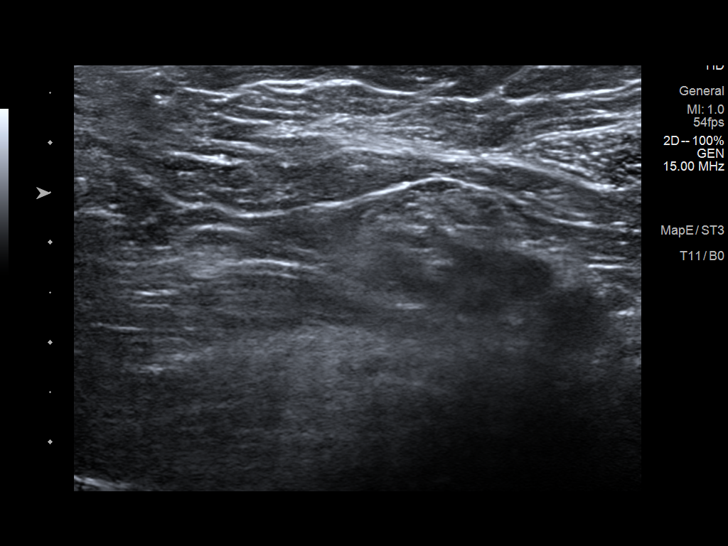
[im 9/20]
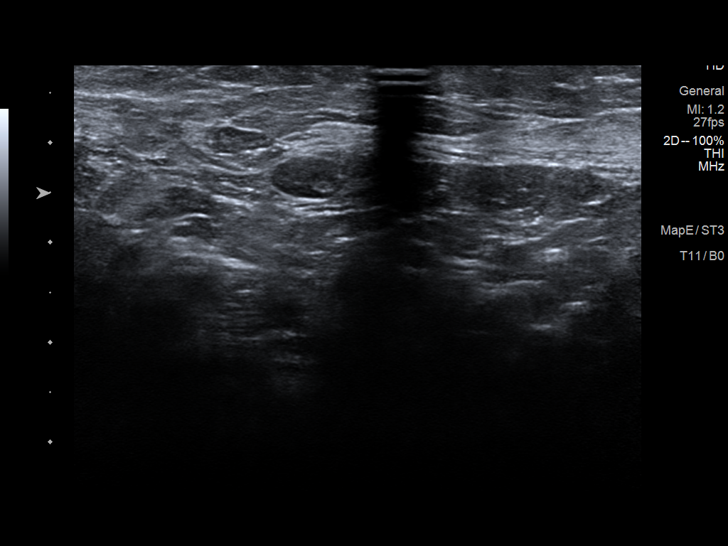
[im 11/20]
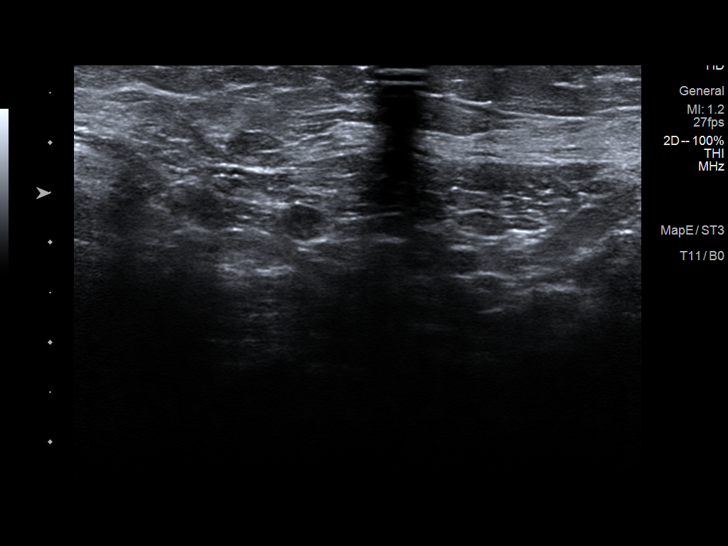
[im 12/20]
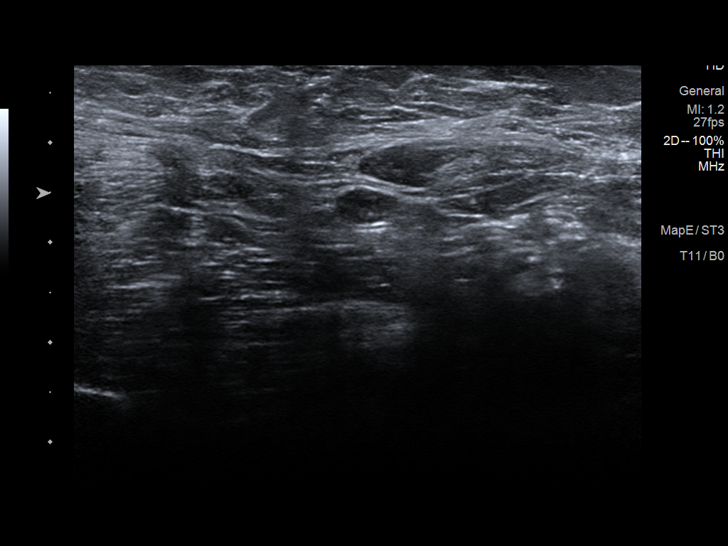
[im 14/20]
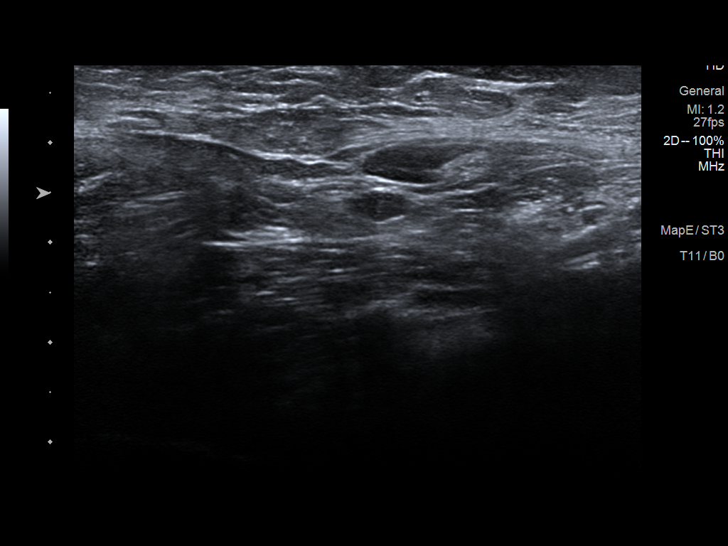
[im 15/20]
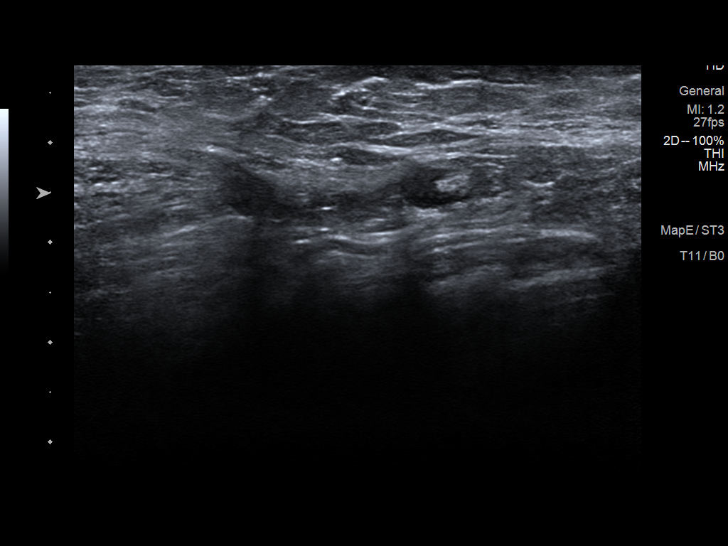
[im 17/20]
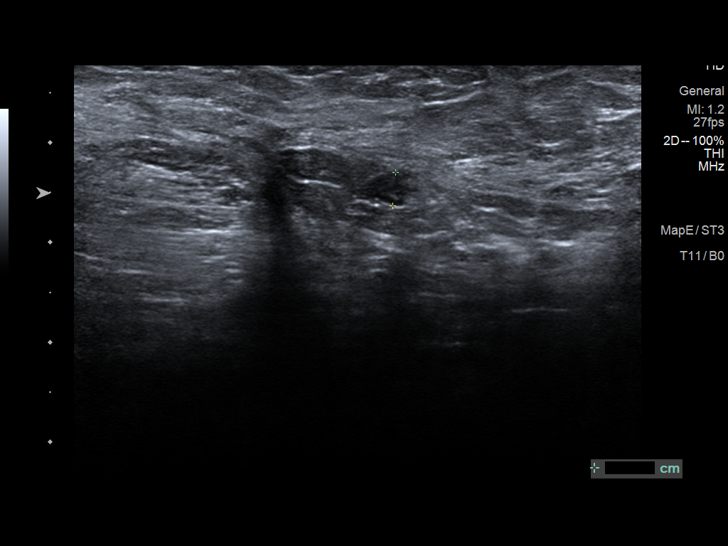
[im 18/20]
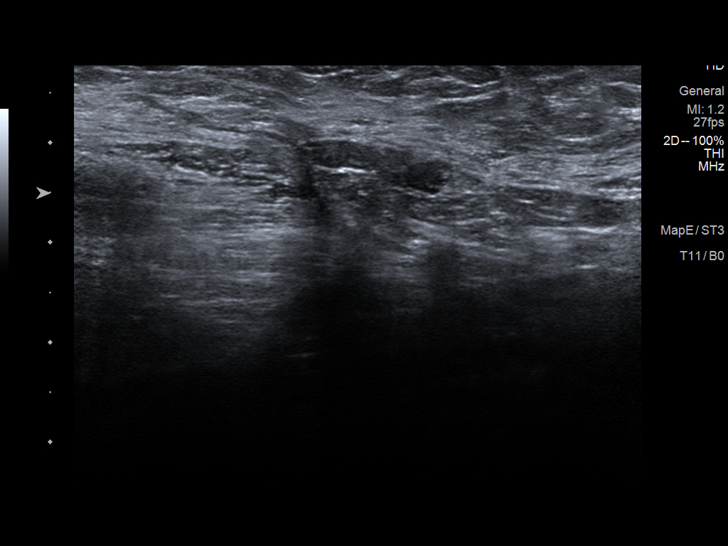
[im 20/20]
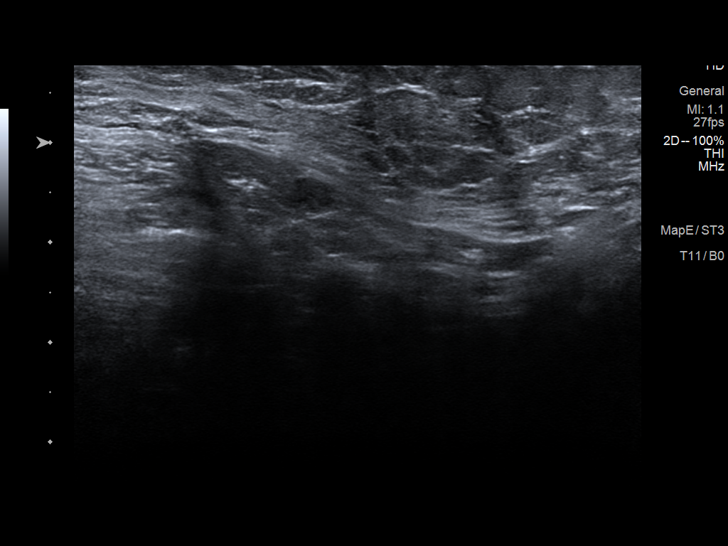

[13 of 20 positions shown; findings below may reference images not displayed]

ACR Breast Density Category b: There are scattered areas of
fibroglandular density.
FINDINGS: 3D tomographic images of the right breast demonstrate an interval
irregular, spiculated mass in the posterior aspect of the upper
inner right breast. No other abnormalities are seen.

Mammographic images were processed with CAD.

On physical exam, the patient has an approximately 2 cm rounded,
palpable mass in the 2 o'clock position of the right breast, 10 cm
from the nipple. There are no palpable right axillary lymph nodes.

Targeted ultrasound is performed, showing a 1.7 x 1.6 x 1.2 cm
irregular, hypoechoic mass abutting the pectoralis muscle in the
posterior aspect of the 2 o'clock position of the right breast, 10
cm from the nipple. This appears to be extending into the anterior
aspect of the underlying pectoralis muscle.

Ultrasound of the right axilla demonstrated multiple normal sized
lymph nodes with borderline diffuse cortical thickening.
IMPRESSION: 1. 1.7 cm irregular, spiculated mass in the posterior aspect of the
2 o'clock position of the right breast with probable underlying
pectoralis muscle invasion. This is highly suspicious for a primary
breast carcinoma.
2. Multiple normal size right axillary lymph nodes with borderline
diffuse cortical thickening. These have a low probability of
representing metastatic nodes.

RECOMMENDATION:
Ultrasound-guided core needle biopsy of the 1.7 cm mass in the 2
o'clock position of the right breast. This has been discussed with
the patient and her husband and scheduled at 2 p.m. on 07/28/2015.

I have discussed the findings and recommendations with the patient.
Results were also provided in writing at the conclusion of the
visit. If applicable, a reminder letter will be sent to the patient
regarding the next appointment.

BI-RADS CATEGORY  5: Highly suggestive of malignancy.

## 2017-06-14 DIAGNOSIS — H35373 Puckering of macula, bilateral: Secondary | ICD-10-CM | POA: Diagnosis not present

## 2017-06-14 DIAGNOSIS — H353131 Nonexudative age-related macular degeneration, bilateral, early dry stage: Secondary | ICD-10-CM | POA: Diagnosis not present

## 2017-06-14 DIAGNOSIS — Z961 Presence of intraocular lens: Secondary | ICD-10-CM | POA: Diagnosis not present

## 2017-06-14 DIAGNOSIS — H401191 Primary open-angle glaucoma, unspecified eye, mild stage: Secondary | ICD-10-CM | POA: Diagnosis not present

## 2017-06-14 DIAGNOSIS — H35363 Drusen (degenerative) of macula, bilateral: Secondary | ICD-10-CM | POA: Diagnosis not present

## 2017-07-04 DIAGNOSIS — H47233 Glaucomatous optic atrophy, bilateral: Secondary | ICD-10-CM | POA: Diagnosis not present

## 2017-07-04 DIAGNOSIS — H35373 Puckering of macula, bilateral: Secondary | ICD-10-CM | POA: Diagnosis not present

## 2017-07-04 DIAGNOSIS — H35363 Drusen (degenerative) of macula, bilateral: Secondary | ICD-10-CM | POA: Diagnosis not present

## 2017-07-04 DIAGNOSIS — H353131 Nonexudative age-related macular degeneration, bilateral, early dry stage: Secondary | ICD-10-CM | POA: Diagnosis not present

## 2017-07-04 DIAGNOSIS — H401131 Primary open-angle glaucoma, bilateral, mild stage: Secondary | ICD-10-CM | POA: Diagnosis not present

## 2017-08-04 DIAGNOSIS — M25562 Pain in left knee: Secondary | ICD-10-CM | POA: Diagnosis not present

## 2017-08-04 DIAGNOSIS — N189 Chronic kidney disease, unspecified: Secondary | ICD-10-CM | POA: Diagnosis not present

## 2017-08-04 DIAGNOSIS — M1712 Unilateral primary osteoarthritis, left knee: Secondary | ICD-10-CM | POA: Diagnosis not present

## 2017-08-14 DIAGNOSIS — I10 Essential (primary) hypertension: Secondary | ICD-10-CM | POA: Diagnosis not present

## 2017-08-14 DIAGNOSIS — E559 Vitamin D deficiency, unspecified: Secondary | ICD-10-CM | POA: Diagnosis not present

## 2017-08-14 DIAGNOSIS — R739 Hyperglycemia, unspecified: Secondary | ICD-10-CM | POA: Diagnosis not present

## 2017-08-30 DIAGNOSIS — Z961 Presence of intraocular lens: Secondary | ICD-10-CM | POA: Diagnosis not present

## 2017-08-30 DIAGNOSIS — Z9849 Cataract extraction status, unspecified eye: Secondary | ICD-10-CM | POA: Diagnosis not present

## 2017-08-30 DIAGNOSIS — H353131 Nonexudative age-related macular degeneration, bilateral, early dry stage: Secondary | ICD-10-CM | POA: Diagnosis not present

## 2017-08-30 DIAGNOSIS — H35363 Drusen (degenerative) of macula, bilateral: Secondary | ICD-10-CM | POA: Diagnosis not present

## 2017-08-30 DIAGNOSIS — H11023 Central pterygium of eye, bilateral: Secondary | ICD-10-CM | POA: Diagnosis not present

## 2017-08-30 DIAGNOSIS — H401131 Primary open-angle glaucoma, bilateral, mild stage: Secondary | ICD-10-CM | POA: Diagnosis not present

## 2017-08-30 DIAGNOSIS — H47233 Glaucomatous optic atrophy, bilateral: Secondary | ICD-10-CM | POA: Diagnosis not present

## 2017-08-30 DIAGNOSIS — H11153 Pinguecula, bilateral: Secondary | ICD-10-CM | POA: Diagnosis not present

## 2017-08-30 DIAGNOSIS — H04123 Dry eye syndrome of bilateral lacrimal glands: Secondary | ICD-10-CM | POA: Diagnosis not present

## 2017-08-30 DIAGNOSIS — H18413 Arcus senilis, bilateral: Secondary | ICD-10-CM | POA: Diagnosis not present

## 2017-09-04 IMAGING — CR DG CHEST 2V
2 series · 2 of 2 positions shown · non-contrast
Comparison: July 31, 2015

CLINICAL DATA: History of right-sided breast carcinoma. Right-sided
chest pain with difficulty breathing

EXAM:
CHEST  2 VIEW

[w chest pa]
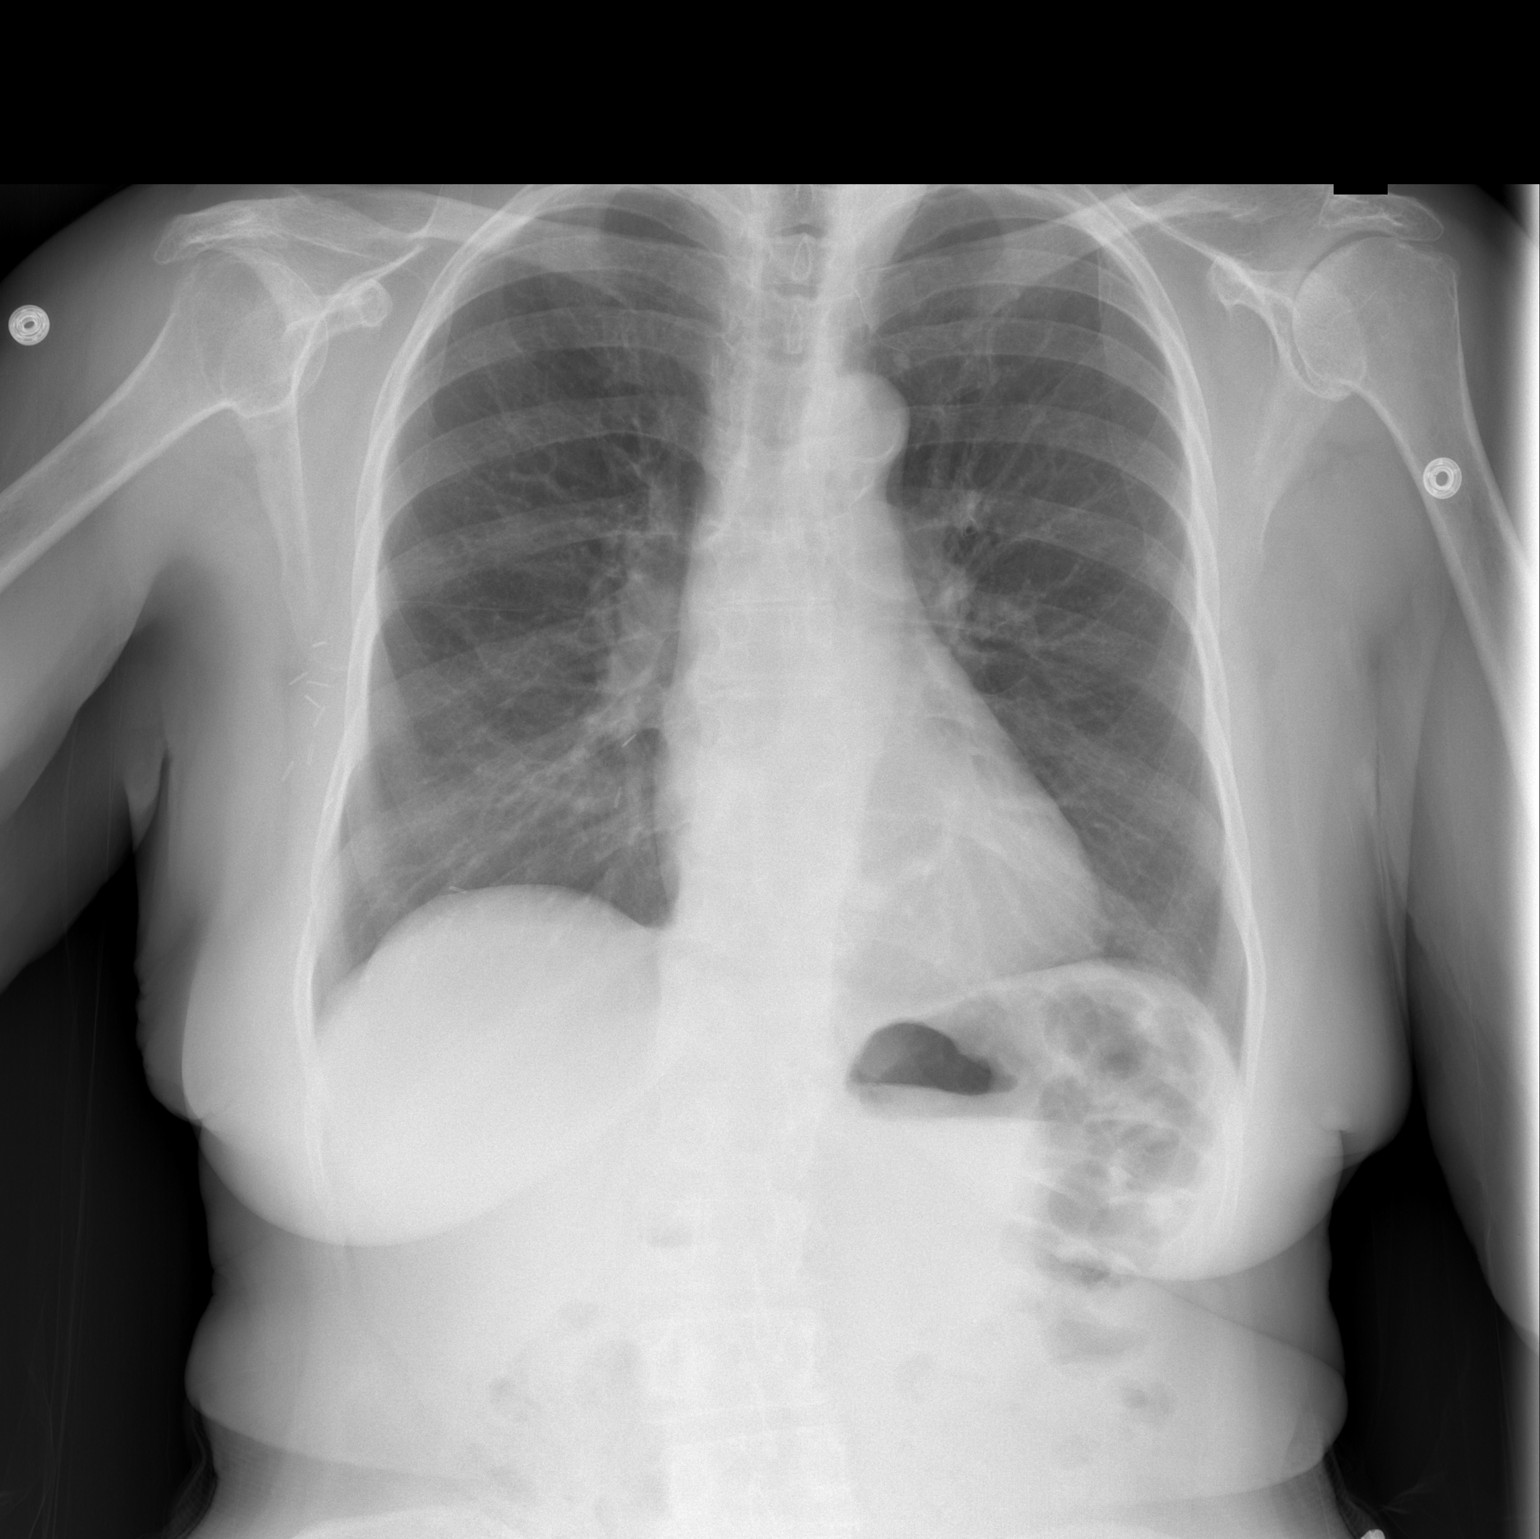

[w chest lat]
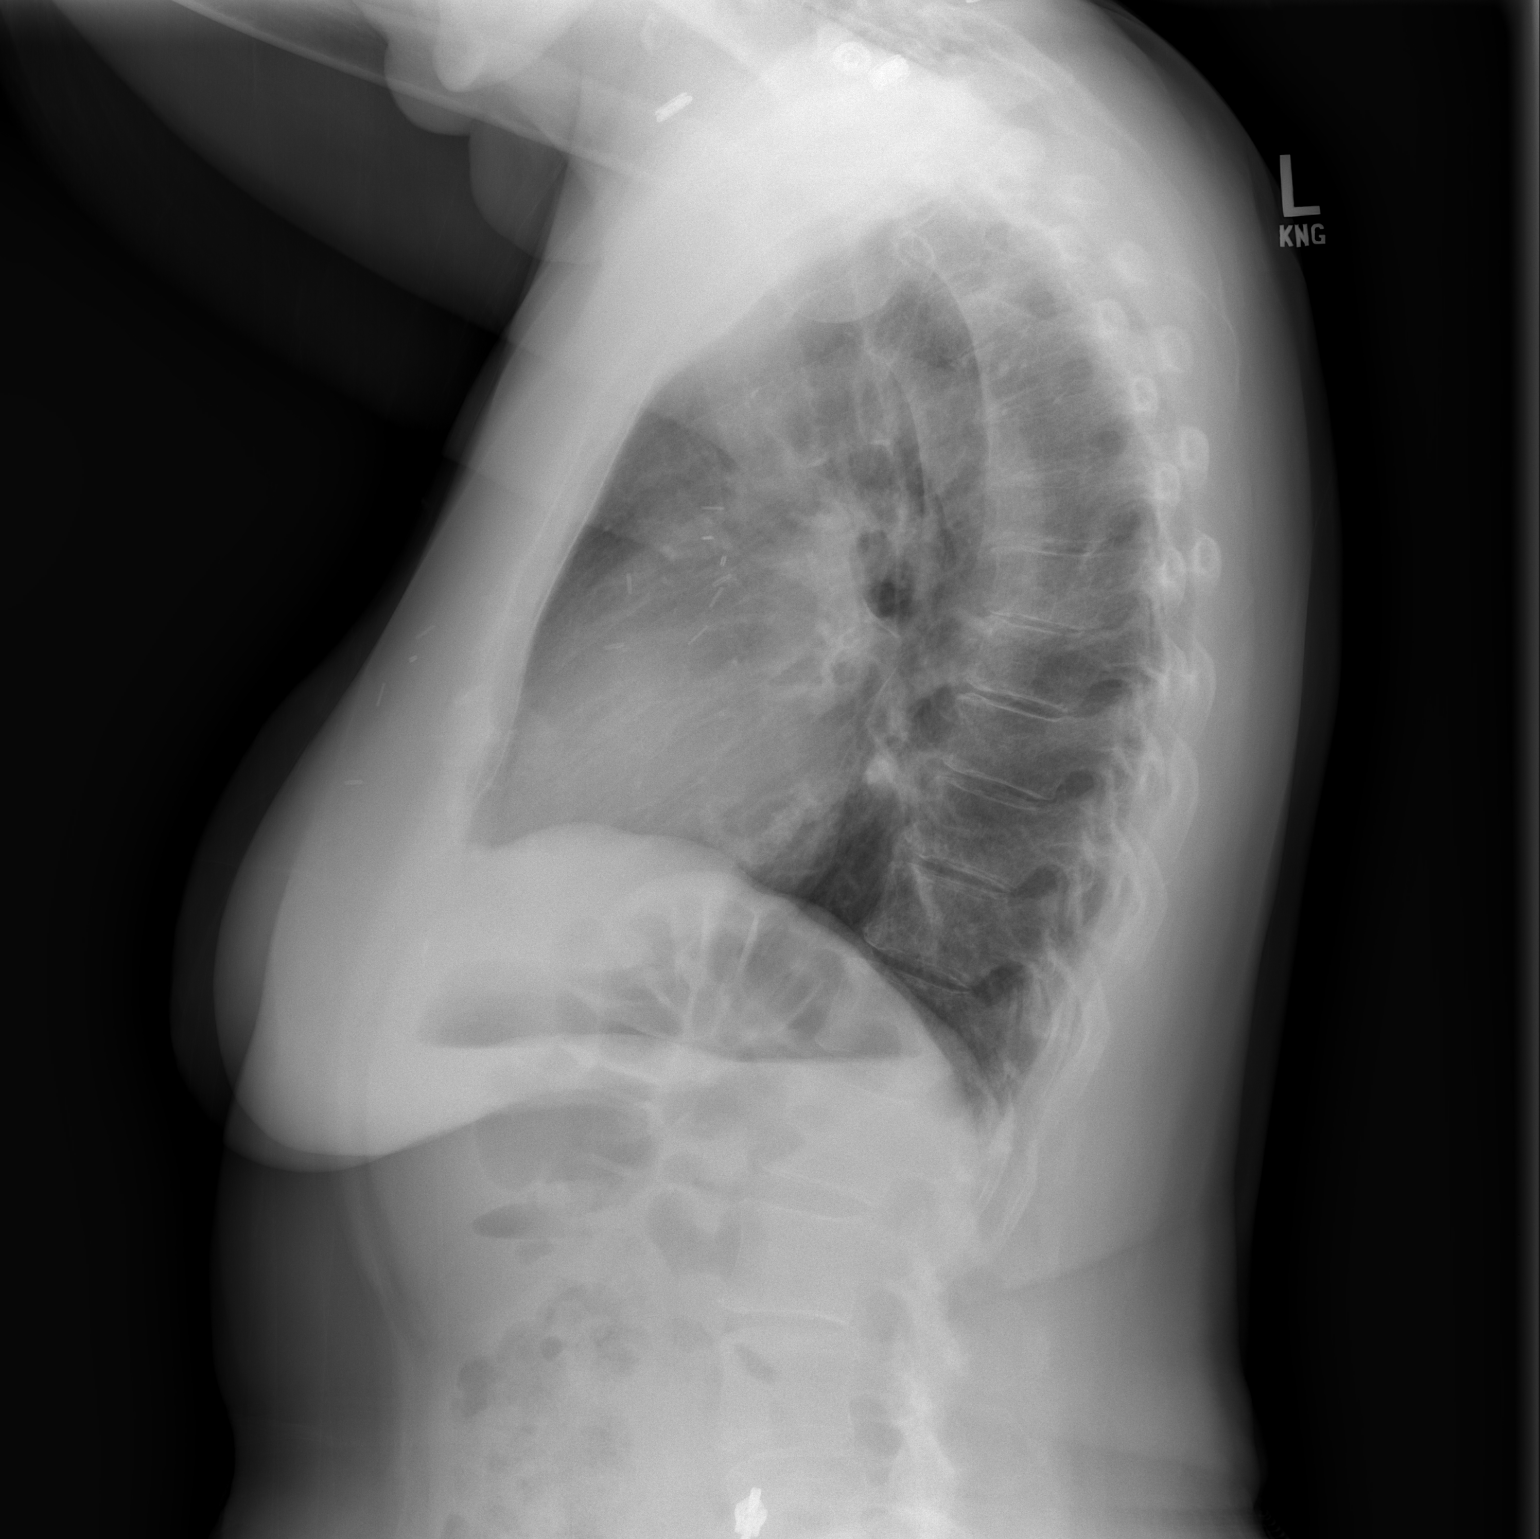

[2 of 2 positions shown; findings below may reference images not displayed]

FINDINGS: There is no edema or consolidation. The heart size and pulmonary
vascularity are normal. No adenopathy. There are surgical clips in
the right breast and axillary regions. There are no blastic or lytic
bone lesions. There is thoracolumbar dextroscoliosis.
IMPRESSION: No edema or consolidation.

## 2017-09-05 DIAGNOSIS — I10 Essential (primary) hypertension: Secondary | ICD-10-CM | POA: Diagnosis not present

## 2017-09-05 DIAGNOSIS — R739 Hyperglycemia, unspecified: Secondary | ICD-10-CM | POA: Diagnosis not present

## 2017-09-05 DIAGNOSIS — R69 Illness, unspecified: Secondary | ICD-10-CM | POA: Diagnosis not present

## 2017-09-05 DIAGNOSIS — D649 Anemia, unspecified: Secondary | ICD-10-CM | POA: Diagnosis not present

## 2017-09-05 DIAGNOSIS — M25562 Pain in left knee: Secondary | ICD-10-CM | POA: Diagnosis not present

## 2017-09-27 DIAGNOSIS — M1712 Unilateral primary osteoarthritis, left knee: Secondary | ICD-10-CM | POA: Diagnosis not present

## 2017-09-27 DIAGNOSIS — N189 Chronic kidney disease, unspecified: Secondary | ICD-10-CM | POA: Diagnosis not present

## 2017-09-27 DIAGNOSIS — M25562 Pain in left knee: Secondary | ICD-10-CM | POA: Diagnosis not present

## 2017-10-09 DIAGNOSIS — I10 Essential (primary) hypertension: Secondary | ICD-10-CM | POA: Diagnosis not present

## 2017-10-09 DIAGNOSIS — R69 Illness, unspecified: Secondary | ICD-10-CM | POA: Diagnosis not present

## 2017-10-09 DIAGNOSIS — R42 Dizziness and giddiness: Secondary | ICD-10-CM | POA: Diagnosis not present

## 2017-10-10 ENCOUNTER — Inpatient Hospital Stay: Payer: Medicare HMO | Attending: Hematology and Oncology | Admitting: Hematology and Oncology

## 2017-10-10 DIAGNOSIS — C50211 Malignant neoplasm of upper-inner quadrant of right female breast: Secondary | ICD-10-CM | POA: Insufficient documentation

## 2017-10-10 DIAGNOSIS — Z79810 Long term (current) use of selective estrogen receptor modulators (SERMs): Secondary | ICD-10-CM | POA: Diagnosis not present

## 2017-10-10 DIAGNOSIS — Z170 Estrogen receptor positive status [ER+]: Secondary | ICD-10-CM | POA: Diagnosis not present

## 2017-10-10 MED ORDER — TAMOXIFEN CITRATE 20 MG PO TABS: 20.0000 mg | ORAL_TABLET | Freq: Every day | ORAL | 3 refills | Status: DC

## 2017-10-10 NOTE — Progress Notes (Signed)
Patient Care Team: Jani Gravel, MD as PCP - General (Internal Medicine) Nicholas Lose, MD as Consulting Physician (Hematology and Oncology) Jovita Kussmaul, MD as Consulting Physician (General Surgery) Thea Silversmith, MD (Inactive) as Consulting Physician (Radiation Oncology)  DIAGNOSIS:  Encounter Diagnosis  Name Primary?  . Malignant neoplasm of upper-inner quadrant of right breast in female, estrogen receptor positive ([place])     SUMMARY OF ONCOLOGIC HISTORY:   Breast cancer of upper-inner quadrant of right female breast ([place])   07/14/2015 Mammogram    Right breast posterior spiculated mass 2:00 position: 1.7 cm, multiple normal-sized right axillary lymph nodes with borderline diffuse cortical thickening      07/28/2015 Initial Diagnosis    Right breast biopsy 2:00: Invasive ductal carcinoma with DCIS, positive for perineural invasion, grade 1, ER 100%, PR 90%, HER-2 negative ratio 1.29, Ki-67 10%      08/24/2015 Clinical Stage    Stage IA: T1c N0      08/24/2015 Breast MRI    Right breast mass 2.4 x 1.8 x 1.6 cm, the enhancement does not appear to abut or invade the adjacent pectoralis muscle      09/25/2015 Surgery    Rt Lumpectomy: IDC grade 1, 2.1 cm, LG DCIS, LVI and PNI, DCIS focally less than 1 mm, 0/3 LN      09/25/2015 Pathologic Stage    Stage IIA: T2 N0      10/11/2015 -  Anti-estrogen oral therapy    Adjuvant antiestrogen therapy with anastrozole 1 mg by mouth daily, could not tolerate it, switched to tamoxifen 20 mg daily 01/11/2016       Radiation Therapy    Not recommended due to fatigue      01/15/2016 Survivorship    Survivorship care plan mailed to patient in lieu of in person visit at her request       CHIEF COMPLIANT: Follow-up on tamoxifen therapy  INTERVAL HISTORY: Kathryn Ortiz is a 77 year old with above-mentioned his right breast cancer treated with lumpectomy followed by adjuvant antiestrogen therapy.  She is tolerating tamoxifen very well.   She does not have any hot flashes or myalgias.  She has not been taking good care of herself because her husband has a terminal stage IV lung cancer.  He is in hospice.  REVIEW OF SYSTEMS:   Constitutional: Denies fevers, chills or abnormal weight loss Eyes: Denies blurriness of vision Ears, nose, mouth, throat, and face: Denies mucositis or sore throat Respiratory: Denies cough, dyspnea or wheezes Cardiovascular: Denies palpitation, chest discomfort Gastrointestinal:  Denies nausea, heartburn or change in bowel habits Skin: Denies abnormal skin rashes Lymphatics: Denies new lymphadenopathy or easy bruising Neurological:Denies numbness, tingling or new weaknesses Behavioral/Psych: Appears to be depressed because of her husband situation Extremities: No lower extremity edema Breast:  denies any pain or lumps or nodules in either breasts All other systems were reviewed with the patient and are negative.  I have reviewed the past medical history, past surgical history, social history and family history with the patient and they are unchanged from previous note.  ALLERGIES:  is allergic to penicillins and celexa [citalopram].  MEDICATIONS:  Current Outpatient Medications  Medication Sig Dispense Refill  . aspirin EC 81 MG EC tablet Take 1 tablet (81 mg total) by mouth daily. 30 tablet 0  . Cholecalciferol (VITAMIN D PO) Take 1 tablet by mouth daily.    . citalopram (CELEXA) 10 MG tablet Take 10 mg by mouth daily.    . clonazePAM (  KLONOPIN) 1 MG tablet Take 0.5 mg by mouth daily as needed for anxiety.    . Cyanocobalamin (VITAMIN B12 PO) Take 1 tablet by mouth daily. Reported on 10/22/2015    . gabapentin (NEURONTIN) 300 MG capsule Take 300 mg by mouth at bedtime.    Marland Kitchen HYDROcodone-acetaminophen (NORCO) 7.5-325 MG tablet Take 1 tablet by mouth 2 (two) times daily.  0  . latanoprost (XALATAN) 0.005 % ophthalmic solution Place 1 drop into both eyes every morning.    Marland Kitchen omeprazole (PRILOSEC) 20  MG capsule Take 20 mg by mouth daily.    . pravastatin (PRAVACHOL) 10 MG tablet Take 1 tablet (10 mg total) by mouth daily at 6 PM. 30 tablet 0  . tamoxifen (NOLVADEX) 20 MG tablet TAKE 1 TABLET BY MOUTH  DAILY 90 tablet 3  . traZODone (DESYREL) 100 MG tablet Take 250 mg by mouth at bedtime. Reported on 12/16/2015     No current facility-administered medications for this visit.     PHYSICAL EXAMINATION: ECOG PERFORMANCE STATUS: 1 - Symptomatic but completely ambulatory  Vitals:   10/10/17 1425  BP: (!) 167/83  Pulse: 91  Resp: 17  Temp: 98.3 F (36.8 C)  SpO2: 100%   Filed Weights   10/10/17 1425  Weight: 145 lb 1.6 oz (65.8 kg)    GENERAL:alert, no distress and comfortable SKIN: skin color, texture, turgor are normal, no rashes or significant lesions EYES: normal, Conjunctiva are pink and non-injected, sclera clear OROPHARYNX:no exudate, no erythema and lips, buccal mucosa, and tongue normal  NECK: supple, thyroid normal size, non-tender, without nodularity LYMPH:  no palpable lymphadenopathy in the cervical, axillary or inguinal LUNGS: clear to auscultation and percussion with normal breathing effort HEART: regular rate & rhythm and no murmurs and no lower extremity edema ABDOMEN:abdomen soft, non-tender and normal bowel sounds MUSCULOSKELETAL:no cyanosis of digits and no clubbing  NEURO: alert & oriented x 3 with fluent speech, no focal motor/sensory deficits EXTREMITIES: No lower extremity edema BREAST: No palpable masses or nodules in either right or left breasts. No palpable axillary supraclavicular or infraclavicular adenopathy no breast tenderness or nipple discharge. (exam performed in the presence of a chaperone)  LABORATORY DATA:  I have reviewed the data as listed CMP Latest Ref Rng & Units 02/08/2017 02/07/2017 02/07/2017  Glucose 65 - 99 mg/dL 85 103(H) 110(H)  BUN 6 - 20 mg/dL '11 11 10  '$ Creatinine 0.44 - 1.00 mg/dL 1.40(H) 1.30(H) 1.39(H)  Sodium 135 - 145  mmol/L 140 140 138  Potassium 3.5 - 5.1 mmol/L 4.8 4.2 4.4  Chloride 101 - 111 mmol/L 109 104 106  CO2 22 - 32 mmol/L 26 - 24  Calcium 8.9 - 10.3 mg/dL 8.4(L) - 9.0  Total Protein 6.5 - 8.1 g/dL - - 6.1(L)  Total Bilirubin 0.3 - 1.2 mg/dL - - 0.3  Alkaline Phos 38 - 126 U/L - - 43  AST 15 - 41 U/L - - 27  ALT 14 - 54 U/L - - 13(L)    Lab Results  Component Value Date   WBC 4.2 02/07/2017   HGB 12.6 02/07/2017   HCT 37.0 02/07/2017   MCV 91.0 02/07/2017   PLT 145 (L) 02/07/2017   NEUTROABS 2.3 02/07/2017    ASSESSMENT & PLAN:  Breast cancer of upper-inner quadrant of right female breast ([place]) Rt Lumpectomy 09/28/15: IDC grade 1, 2.1 cm, LG DCIS, LVI and PNI, DCIS focally less than 1 mm, 0/3 LN, T2N0 (Stage 2A) Patient did not undergo radiation  given her age  Treatment plan: Antiestrogen therapy with anastrozole 1 mg daily started March 2017 stopped 01/11/2016 Starting tamoxifen 20 mg daily 01/11/2016  Tamoxifen toxicities: Much better tolerated, denies any of the profound mood swings or hot flashes. Patient's husband has been diagnosed with stage IV poorly differentiated lung cancer and is meeting with Dr. Julien Nordmann to discuss starting Pembrolizumab immunotherapy treatment. She is very anxious and worried about his health.  Surveillance: 1.  Breast exam 10/10/2017: Benign  2. Mammograms June 2018: Benign, breast density category B  Return to clinic in 1 year for follow-up     I spent 25 minutes talking to the patient of which more than half was spent in counseling and coordination of care.  No orders of the defined types were placed in this encounter.  The patient has a good understanding of the overall plan. she agrees with it. she will call with any problems that may develop before the next visit here.   Harriette Ohara, MD 10/10/17

## 2017-10-10 NOTE — Assessment & Plan Note (Signed)
Rt Lumpectomy 09/28/15: IDC grade 1, 2.1 cm, LG DCIS, LVI and PNI, DCIS focally less than 1 mm, 0/3 LN, T2N0 (Stage 2A) Patient did not undergo radiation given her age  Treatment plan: Antiestrogen therapy with anastrozole 1 mg daily started March 2017 stopped 01/11/2016 Starting tamoxifen 20 mg daily 01/11/2016  Tamoxifen toxicities: Much better tolerated, denies any of the profound mood swings or hot flashes. Patient's husband has been diagnosed with stage IV poorly differentiated lung cancer and is meeting with Dr. Julien Nordmann to discuss starting Pembrolizumab immunotherapy treatment. She is very anxious and worried about his health.  Surveillance: 1.  Breast exam 10/10/2017: Benign  2. Mammograms June 2018: Benign, breast density category B  Return to clinic in 1 year for follow-up

## 2017-10-12 DIAGNOSIS — R69 Illness, unspecified: Secondary | ICD-10-CM | POA: Diagnosis not present

## 2017-10-12 DIAGNOSIS — R413 Other amnesia: Secondary | ICD-10-CM | POA: Diagnosis not present

## 2017-10-12 DIAGNOSIS — F4320 Adjustment disorder, unspecified: Secondary | ICD-10-CM | POA: Diagnosis not present

## 2017-10-12 DIAGNOSIS — F4322 Adjustment disorder with anxiety: Secondary | ICD-10-CM | POA: Diagnosis not present

## 2017-11-01 DIAGNOSIS — M25562 Pain in left knee: Secondary | ICD-10-CM | POA: Diagnosis not present

## 2017-11-01 DIAGNOSIS — M170 Bilateral primary osteoarthritis of knee: Secondary | ICD-10-CM | POA: Diagnosis not present

## 2017-11-01 DIAGNOSIS — Z79899 Other long term (current) drug therapy: Secondary | ICD-10-CM | POA: Diagnosis not present

## 2017-11-01 DIAGNOSIS — R21 Rash and other nonspecific skin eruption: Secondary | ICD-10-CM | POA: Diagnosis not present

## 2017-11-01 DIAGNOSIS — I10 Essential (primary) hypertension: Secondary | ICD-10-CM | POA: Diagnosis not present

## 2017-11-29 DIAGNOSIS — R634 Abnormal weight loss: Secondary | ICD-10-CM | POA: Diagnosis not present

## 2017-11-29 DIAGNOSIS — D638 Anemia in other chronic diseases classified elsewhere: Secondary | ICD-10-CM | POA: Diagnosis not present

## 2017-11-29 DIAGNOSIS — G588 Other specified mononeuropathies: Secondary | ICD-10-CM | POA: Diagnosis not present

## 2017-11-29 DIAGNOSIS — D649 Anemia, unspecified: Secondary | ICD-10-CM | POA: Diagnosis not present

## 2017-11-29 DIAGNOSIS — I10 Essential (primary) hypertension: Secondary | ICD-10-CM | POA: Diagnosis not present

## 2017-11-29 DIAGNOSIS — R739 Hyperglycemia, unspecified: Secondary | ICD-10-CM | POA: Diagnosis not present

## 2017-12-06 DIAGNOSIS — Z0000 Encounter for general adult medical examination without abnormal findings: Secondary | ICD-10-CM | POA: Diagnosis not present

## 2017-12-06 DIAGNOSIS — R51 Headache: Secondary | ICD-10-CM | POA: Diagnosis not present

## 2017-12-06 DIAGNOSIS — K219 Gastro-esophageal reflux disease without esophagitis: Secondary | ICD-10-CM | POA: Diagnosis not present

## 2018-01-18 ENCOUNTER — Other Ambulatory Visit: Payer: Self-pay | Admitting: Hematology and Oncology

## 2018-01-18 DIAGNOSIS — Z853 Personal history of malignant neoplasm of breast: Secondary | ICD-10-CM

## 2018-02-12 ENCOUNTER — Encounter: Payer: Self-pay | Admitting: *Deleted

## 2018-02-12 ENCOUNTER — Institutional Professional Consult (permissible substitution): Payer: Medicare Other | Admitting: Neurology

## 2018-02-12 ENCOUNTER — Telehealth: Payer: Self-pay | Admitting: *Deleted

## 2018-02-12 NOTE — Telephone Encounter (Signed)
Pt no showed their new pt appt on 02/12/2018 @ 2:30 pm.

## 2018-02-14 ENCOUNTER — Encounter: Payer: Self-pay | Admitting: Neurology

## 2018-02-20 ENCOUNTER — Ambulatory Visit
Admission: RE | Admit: 2018-02-20 | Discharge: 2018-02-20 | Disposition: A | Discharge status: Home / Self Care | Payer: Medicare HMO | Source: Ambulatory Visit | Attending: Hematology and Oncology | Admitting: Hematology and Oncology

## 2018-02-20 DIAGNOSIS — R928 Other abnormal and inconclusive findings on diagnostic imaging of breast: Secondary | ICD-10-CM | POA: Diagnosis not present

## 2018-02-20 DIAGNOSIS — Z853 Personal history of malignant neoplasm of breast: Secondary | ICD-10-CM

## 2018-03-07 ENCOUNTER — Encounter: Payer: Self-pay | Admitting: Neurology

## 2018-03-07 ENCOUNTER — Ambulatory Visit: Payer: Medicare HMO | Admitting: Neurology

## 2018-03-07 VITALS — BP 126/74 | HR 76 | Ht 60.0 in | Wt 148.0 lb

## 2018-03-07 DIAGNOSIS — F4329 Adjustment disorder with other symptoms: Secondary | ICD-10-CM | POA: Diagnosis not present

## 2018-03-07 DIAGNOSIS — R51 Headache: Secondary | ICD-10-CM | POA: Diagnosis not present

## 2018-03-07 DIAGNOSIS — R519 Headache, unspecified: Secondary | ICD-10-CM

## 2018-03-07 DIAGNOSIS — G43009 Migraine without aura, not intractable, without status migrainosus: Secondary | ICD-10-CM | POA: Diagnosis not present

## 2018-03-07 DIAGNOSIS — R69 Illness, unspecified: Secondary | ICD-10-CM | POA: Diagnosis not present

## 2018-03-07 MED ORDER — GABAPENTIN 300 MG PO CAPS: 300.0000 mg | ORAL_CAPSULE | Freq: Three times a day (TID) | ORAL | 11 refills | Status: DC | PRN

## 2018-03-07 NOTE — Patient Instructions (Addendum)
Migraine Headache A migraine headache is an intense, throbbing pain on one side or both sides of the head. Migraines may also cause other symptoms, such as nausea, vomiting, and sensitivity to light and noise. What are the causes? Doing or taking certain things may also trigger migraines, such as:  Alcohol.  Smoking.  Medicines, such as: ? Medicine used to treat chest pain (nitroglycerine). ? Birth control pills. ? Estrogen pills. ? Certain blood pressure medicines.  Aged cheeses, chocolate, or caffeine.  Foods or drinks that contain nitrates, glutamate, aspartame, or tyramine.  Physical activity.  stress  Other things that may trigger a migraine include:  Menstruation.  Pregnancy.  Hunger.  Stress, lack of sleep, too much sleep, or fatigue.  Weather changes.  What increases the risk? The following factors may make you more likely to experience migraine headaches:  Age. Risk increases with age.  Family history of migraine headaches.  Being Caucasian.  Depression and anxiety.  Obesity.  Being a woman.  Having a hole in the heart (patent foramen ovale) or other heart problems.  What are the signs or symptoms? The main symptom of this condition is pulsating or throbbing pain. Pain may:  Happen in any area of the head, such as on one side or both sides.  Interfere with daily activities.  Get worse with physical activity.  Get worse with exposure to bright lights or loud noises.  Other symptoms may include:  Nausea.  Vomiting.  Dizziness.  General sensitivity to bright lights, loud noises, or smells.  Before you get a migraine, you may get warning signs that a migraine is developing (aura). An aura may include:  Seeing flashing lights or having blind spots.  Seeing bright spots, halos, or zigzag lines.  Having tunnel vision or blurred vision.  Having numbness or a tingling feeling.  Having trouble talking.  Having muscle weakness.  How  is this diagnosed? A migraine headache can be diagnosed based on:  Your symptoms.  A physical exam.  Tests, such as CT scan or MRI of the head. These imaging tests can help rule out other causes of headaches.  Taking fluid from the spine (lumbar puncture) and analyzing it (cerebrospinal fluid analysis, or CSF analysis).  How is this treated? A migraine headache is usually treated with medicines that:  Relieve pain.  Relieve nausea.  Prevent migraines from coming back.  Treatment may also include:  Acupuncture.  Lifestyle changes like avoiding foods that trigger migraines.  Follow these instructions at home: Medicines  Take over-the-counter and prescription medicines only as told by your health care provider.  Do not drive or use heavy machinery while taking prescription pain medicine.  To prevent or treat constipation while you are taking prescription pain medicine, your health care provider may recommend that you: ? Drink enough fluid to keep your urine clear or pale yellow. ? Take over-the-counter or prescription medicines. ? Eat foods that are high in fiber, such as fresh fruits and vegetables, whole grains, and beans. ? Limit foods that are high in fat and processed sugars, such as fried and sweet foods. Lifestyle  Avoid alcohol use.  Do not use any products that contain nicotine or tobacco, such as cigarettes and e-cigarettes. If you need help quitting, ask your health care provider.  Get at least 8 hours of sleep every night.  Limit your stress. General instructions   Keep a journal to find out what may trigger your migraine headaches. For example, write down: ? What you  eat and drink. ? How much sleep you get. ? Any change to your diet or medicines.  If you have a migraine: ? Avoid things that make your symptoms worse, such as bright lights. ? It may help to lie down in a dark, quiet room. ? Do not drive or use heavy machinery. ? Ask your health care  provider what activities are safe for you while you are experiencing symptoms.  Keep all follow-up visits as told by your health care provider. This is important. Contact a health care provider if:  You develop symptoms that are different or more severe than your usual migraine symptoms. Get help right away if:  Your migraine becomes severe.  You have a fever.  You have a stiff neck.  You have vision loss.  Your muscles feel weak or like you cannot control them.  You start to lose your balance often.  You develop trouble walking.  You faint. This information is not intended to replace advice given to you by your health care provider. Make sure you discuss any questions you have with your health care provider. Document Released: 07/11/2005 Document Revised: 01/29/2016 Document Reviewed: 12/28/2015 Elsevier Interactive Patient Education  2017 [employer].

## 2018-03-07 NOTE — Progress Notes (Signed)
[facility]    Provider:  [name] Referring Provider: Jani Gravel, MD Primary Care Physician:  Jani Gravel, MD  CC:  Headache  HPI:  Kathryn Ortiz is a 77 y.o. female here as a referral from Dr. Maudie Mercury for headaches in the setting of her husband passing away. In the last 2 months having left sided pulsating, pounding, throbbing,no new vision changes, , some nausea, movement makes it worse, she blocks her eyes from the light and gets dizzy. Has them 1-2x a week, she thinks stress since march. She takes hydrocodone and it helps 1/2 a pill. Can last all day off and on if not treated. No vomiting. Associated dizziness. She has been moving, selling the house, very stressful, tears up today. No other focal neurologic deficits, associated symptoms, inciting events or modifiable factors. Here with daughter who also provides information and reports migrainous headaches.  Reviewed notes, labs and imaging from outside physicians, which showed:  Reviewed referring physician notes.  Patient has a history of meningioma and a headache for the past 2 to 3 weeks feels nauseated denies photophobia weakness numbness tingling this was at her appointment in May.  CBC CMP and TSH were ordered.  She is on hydrocodone Tylenol, clonazepam.  Reviewed MRIs from 2017 and most recent 01/2018. Meningioma is stable without edema.  Review of Systems: Patient complains of symptoms per HPI as well as the following symptoms: Fatigue, ringing in ears, back pain, headaches, depression, restless legs, itching. Pertinent negatives and positives per HPI. All others negative.   Social History   Socioeconomic History  . Marital status: Widowed    Spouse name: [name]  . Number of children: 3  . Years of education: 12 +  . Highest education level: Not on file  Occupational History    Comment: retired  Scientific laboratory technician  . Financial resource strain: Not on file  . Food insecurity:    Worry: Not on file   Inability: Not on file  . Transportation needs:    Medical: Not on file    Non-medical: Not on file  Tobacco Use  . Smoking status: Never Smoker  . Smokeless tobacco: Current User    Types: Chew  . Tobacco comment: 10/26/15 snuff 1 tsp daily  Substance and Sexual Activity  . Alcohol use: No    Alcohol/week: 0.0 standard drinks  . Drug use: No  . Sexual activity: Yes    Birth control/protection: Post-menopausal  Lifestyle  . Physical activity:    Days per week: Not on file    Minutes per session: Not on file  . Stress: Not on file  Relationships  . Social connections:    Talks on phone: Not on file    Gets together: Not on file    Attends religious service: Not on file    Active member of club or organization: Not on file    Attends meetings of clubs or organizations: Not on file    Relationship status: Not on file  . Intimate partner violence:    Fear of current or ex partner: Not on file    Emotionally abused: Not on file    Physically abused: Not on file    Forced sexual activity: Not on file  Other Topics Concern  . Not on file  Social History Narrative   Lives at home with her daughter   Caffeine - coffee 2 cups daily   Right handed    Family History  Problem Relation Age of Onset  .  Breast cancer Sister        breast  . Suicidality Father   . Heart failure Mother   . Migraines Daughter   . Hypertension Other   . Drug abuse Daughter   . Heart Problems Brother 35       CABG  . Osteoarthritis Sister        3 shoulder surgeries, had staph infection once  . Heart Problems Brother   . Seizures Neg Hx     Past Medical History:  Diagnosis Date  . Anxiety    takes Clonazepam daily as needed  . Arthritis   . Back pain    from knee pain  . Breast cancer ([place])    Right breast  . DDD (degenerative disc disease), lumbar   . DDD (degenerative disc disease), lumbar    Dr. Dian Situ  . Depression    takes Celexa daily  . Depression   . Fall 10/2008  .  Fluid in knee    L & R in pas  . GERD (gastroesophageal reflux disease)    takes Omeprazole daily  . Glaucoma   . Head injury 05/2007   due to fall   . Hyperlipidemia    takes Lovastatin 2 times a week  . Hypertension    was on meds but has been off x 2 yr  . Insomnia    takes Trazodone nightly  . Joint pain   . Joint swelling   . Macular degeneration    dry   . Meningioma ([place])   . Mitral regurgitation    mild, echo 2017 EF 60-65%  . Neurogenic claudication    secondary to nerve root impingement L4-5   . Neurogenic claudication 11/2011   secondary to nerve root impingement L4-5  . Osteoarthritis of right knee, primary localized 04/14/2015  . S/P total knee arthroplasty    R  . Sciatica   . Stenosis of carotid artery 10-27-15   R & L  . Syncope 11-13-15  . Urinary frequency   . Weakness    tingling in hands and feet    Past Surgical History:  Procedure Laterality Date  . BREAST EXCISIONAL BIOPSY     left   . BREAST LUMPECTOMY     October 01 2015 right  . BREAST LUMPECTOMY WITH RADIOACTIVE SEED AND SENTINEL LYMPH NODE BIOPSY Right 09/28/2015   Procedure: BREAST LUMPECTOMY WITH RADIOACTIVE SEED AND SENTINEL LYMPH NODE BIOPSY;  Surgeon: Autumn Messing III, MD;  Location: [facility];  Service: General;  Laterality: Right;  . BREAST SURGERY     breast biopsy  . cataract surgery Bilateral   . CERVICAL BIOPSY  03/2007   negative by Dr. Aloha Gell  . COLONOSCOPY    . COLPOSCOPY     low grade cin 06/2007  . epidural injections    . knee injections Right 05/2014   synvisc injection 05/26/14, 06/02/14, 05/30/14,   . SHOULDER SURGERY Bilateral   . TOTAL KNEE ARTHROPLASTY Right 01/12/2015  . TOTAL KNEE ARTHROPLASTY Right 04/14/2015   Procedure: TOTAL KNEE ARTHROPLASTY;  Surgeon: Marchia Bond, MD;  Location: [facility];  Service: Orthopedics;  Laterality: Right;    Current Outpatient Medications  Medication Sig Dispense Refill  . Cholecalciferol (VITAMIN D PO) Take 1  tablet by mouth daily.    Marland Kitchen CINNAMON PO Take 500 mg by mouth.    . citalopram (CELEXA) 10 MG tablet Take 10 mg by mouth daily.    . clonazePAM (KLONOPIN) 1  MG tablet Take 0.5 mg by mouth daily as needed for anxiety.    . clotrimazole-betamethasone (LOTRISONE) cream Apply 1 application topically 2 (two) times daily.    . Cyanocobalamin (VITAMIN B12 PO) Take 1 tablet by mouth daily. Reported on 10/22/2015    . gabapentin (NEURONTIN) 300 MG capsule Take 1 capsule (300 mg total) by mouth 3 (three) times daily as needed. 90 capsule 11  . HYDROcodone-acetaminophen (NORCO) 7.5-325 MG tablet Take 1 tablet by mouth 2 (two) times daily.  0  . latanoprost (XALATAN) 0.005 % ophthalmic solution Place 1 drop into both eyes every morning.    Marland Kitchen omega-3 acid ethyl esters (LOVAZA) 1 g capsule Take 1 g by mouth daily.    Marland Kitchen omeprazole (PRILOSEC) 20 MG capsule Take 20 mg by mouth daily.    . pravastatin (PRAVACHOL) 10 MG tablet Take 1 tablet (10 mg total) by mouth daily at 6 PM. 30 tablet 0  . tamoxifen (NOLVADEX) 20 MG tablet Take 1 tablet (20 mg total) by mouth daily. 90 tablet 3  . traZODone (DESYREL) 100 MG tablet Take 200 mg by mouth at bedtime. Reported on 12/16/2015    . aspirin EC 81 MG EC tablet Take 1 tablet (81 mg total) by mouth daily. 30 tablet 0   No current facility-administered medications for this visit.     Allergies as of 03/07/2018 - Review Complete 03/07/2018  Allergen Reaction Noted  . Penicillins Hives, Itching, and Other (See Comments) 04/02/2015  . Celexa [citalopram] Other (See Comments)     Vitals: BP 126/74 (BP Location: Right Arm, Patient Position: Sitting)   Pulse 76   Ht 5' (1.524 m)   Wt 148 lb (67.1 kg)   BMI 28.90 kg/m  Last Weight:  Wt Readings from Last 1 Encounters:  03/07/18 148 lb (67.1 kg)   Last Height:   Ht Readings from Last 1 Encounters:  03/07/18 5' (1.524 m)    Physical exam: Exam: Gen: NAD, teary, obese                CV: RRR, no MRG. No Carotid  Bruits. No peripheral edema, warm, nontender Eyes: Conjunctivae clear without exudates or hemorrhage  Neuro: Detailed Neurologic Exam  Speech:    Speech is normal; fluent and spontaneous with normal comprehension.  Cognition:    The patient is oriented to person, place, and time;     recent and remote memory intact;     language fluent;     normal attention, concentration,     fund of knowledge Cranial Nerves:    The pupils are equal, round, and reactive to light. Attempted fundoscopic exam could not visualize due ot small pupils and glaucoma/cataracts. Visual fields are full to finger confrontation. Extraocular movements are intact. Trigeminal sensation is intact and the muscles of mastication are normal. The face is symmetric. The palate elevates in the midline. Hearing intact. Voice is normal. Shoulder shrug is normal. The tongue has normal motion without fasciculations.   Coordination:    No dysmetria   Gait:    Slightly wide based and stooped, antalgic  Motor Observation:    No asymmetry, no atrophy, and no involuntary movements noted. Tone:    Normal muscle tone.    Posture:    Posture is normal. normal erect    Strength: mild LE prox weakness but otherwise strength is V/V in the upper and lower limbs.      Sensation: intact to LT     Reflex Exam:  DTR's:    Brisk Ajs and biceps Toes:    The toes are downgoing bilaterally.   Clonus:    Clonus is absent.       Assessment/Plan:  Very lovely 77 year old here for headaches in the setting of extreme stress, husband died, selling the house, never paid the bills and taking over everything husband used to do, is teary in clinic.  - Patient's symptoms do appear to be migrainous in quality. After much discussion appears daughter with similar migrainous headaches. Explained these are likely migraines, they are thought to be genetic. And likely brought on by severe stress and grieving.  - Meningioma has not changed in  several years(reviewed 2017 vs 2019 MRI), no edema, unlikely the cause  - Discussed treatment, stress reduction, she already takes gabapentin instead of starting anything new instructed her to take it 3x a day prn as needed for migraine.  Stress and adjustment d/o: discuss with pcp may need therapy and medication management  - crp and esr to eval for GCA but no new vision changes, jaw pain, bounding temporal pulses or other signs/symptoms.   Orders Placed This Encounter  Procedures  . C-reactive protein  . Sedimentation rate   Meds ordered this encounter  Medications  . gabapentin (NEURONTIN) 300 MG capsule    Sig: Take 1 capsule (300 mg total) by mouth 3 (three) times daily as needed.    Dispense:  90 capsule    Refill:  11    Discussed: To prevent or relieve headaches, try the following: Cool Compress. Lie down and place a cool compress on your head.  Avoid headache triggers. If certain foods or odors seem to have triggered your migraines in the past, avoid them. A headache diary might help you identify triggers.  Include physical activity in your daily routine. Try a daily walk or other moderate aerobic exercise.  Manage stress. Find healthy ways to cope with the stressors, such as delegating tasks on your to-do list.  Practice relaxation techniques. Try deep breathing, yoga, massage and visualization.  Eat regularly. Eating regularly scheduled meals and maintaining a healthy diet might help prevent headaches. Also, drink plenty of fluids.  Follow a regular sleep schedule. Sleep deprivation might contribute to headaches Consider biofeedback. With this mind-body technique, you learn to control certain bodily functions - such as muscle tension, heart rate and blood pressure - to prevent headaches or reduce headache pain.    Proceed to emergency room if you experience new or worsening symptoms or symptoms do not resolve, if you have new neurologic symptoms or if headache is severe,  or for any concerning symptom.   Provided education and documentation from American headache Society toolbox including articles on: chronic migraine medication overuse headache, chronic migraines, prevention of migraines, behavioral and other nonpharmacologic treatments for headache.   Cc: Dr. Quincy Sheehan, MD  [facility] [address] [place], [place] [address]  Phone [phone] Fax [phone]

## 2018-03-08 ENCOUNTER — Institutional Professional Consult (permissible substitution): Payer: Medicare HMO | Admitting: Neurology

## 2018-03-08 LAB — SEDIMENTATION RATE: SED RATE: 3 mm/h (ref 0–40)

## 2018-03-08 LAB — C-REACTIVE PROTEIN

## 2018-04-09 DIAGNOSIS — R69 Illness, unspecified: Secondary | ICD-10-CM | POA: Diagnosis not present

## 2018-04-09 DIAGNOSIS — G8929 Other chronic pain: Secondary | ICD-10-CM | POA: Diagnosis not present

## 2018-04-09 DIAGNOSIS — Z5181 Encounter for therapeutic drug level monitoring: Secondary | ICD-10-CM | POA: Diagnosis not present

## 2018-04-23 DIAGNOSIS — M25562 Pain in left knee: Secondary | ICD-10-CM | POA: Diagnosis not present

## 2018-04-23 DIAGNOSIS — M1712 Unilateral primary osteoarthritis, left knee: Secondary | ICD-10-CM | POA: Diagnosis not present

## 2018-04-23 DIAGNOSIS — Z6831 Body mass index (BMI) 31.0-31.9, adult: Secondary | ICD-10-CM | POA: Diagnosis not present

## 2018-05-21 DIAGNOSIS — R69 Illness, unspecified: Secondary | ICD-10-CM | POA: Diagnosis not present

## 2018-05-28 DIAGNOSIS — T63424A Toxic effect of venom of ants, undetermined, initial encounter: Secondary | ICD-10-CM | POA: Diagnosis not present

## 2018-05-30 DIAGNOSIS — R5382 Chronic fatigue, unspecified: Secondary | ICD-10-CM | POA: Diagnosis not present

## 2018-05-30 DIAGNOSIS — T63424A Toxic effect of venom of ants, undetermined, initial encounter: Secondary | ICD-10-CM | POA: Diagnosis not present

## 2018-06-07 DIAGNOSIS — G8929 Other chronic pain: Secondary | ICD-10-CM | POA: Diagnosis not present

## 2018-06-07 DIAGNOSIS — Z79899 Other long term (current) drug therapy: Secondary | ICD-10-CM | POA: Diagnosis not present

## 2018-06-07 DIAGNOSIS — R69 Illness, unspecified: Secondary | ICD-10-CM | POA: Diagnosis not present

## 2018-06-07 DIAGNOSIS — M170 Bilateral primary osteoarthritis of knee: Secondary | ICD-10-CM | POA: Diagnosis not present

## 2018-06-07 DIAGNOSIS — I10 Essential (primary) hypertension: Secondary | ICD-10-CM | POA: Diagnosis not present

## 2018-06-07 DIAGNOSIS — C50919 Malignant neoplasm of unspecified site of unspecified female breast: Secondary | ICD-10-CM | POA: Diagnosis not present

## 2018-06-07 DIAGNOSIS — M25569 Pain in unspecified knee: Secondary | ICD-10-CM | POA: Diagnosis not present

## 2018-06-11 DIAGNOSIS — R69 Illness, unspecified: Secondary | ICD-10-CM | POA: Diagnosis not present

## 2018-06-14 DIAGNOSIS — I10 Essential (primary) hypertension: Secondary | ICD-10-CM | POA: Diagnosis not present

## 2018-06-14 DIAGNOSIS — Z853 Personal history of malignant neoplasm of breast: Secondary | ICD-10-CM | POA: Diagnosis not present

## 2018-06-14 DIAGNOSIS — M1990 Unspecified osteoarthritis, unspecified site: Secondary | ICD-10-CM | POA: Diagnosis not present

## 2018-06-14 DIAGNOSIS — M069 Rheumatoid arthritis, unspecified: Secondary | ICD-10-CM | POA: Diagnosis not present

## 2018-06-30 DIAGNOSIS — Z6830 Body mass index (BMI) 30.0-30.9, adult: Secondary | ICD-10-CM | POA: Diagnosis not present

## 2018-06-30 DIAGNOSIS — Z853 Personal history of malignant neoplasm of breast: Secondary | ICD-10-CM | POA: Diagnosis not present

## 2018-06-30 DIAGNOSIS — M1990 Unspecified osteoarthritis, unspecified site: Secondary | ICD-10-CM | POA: Diagnosis not present

## 2018-10-11 ENCOUNTER — Inpatient Hospital Stay: Payer: Medicare HMO | Admitting: Hematology and Oncology

## 2018-11-05 ENCOUNTER — Telehealth: Payer: Self-pay | Admitting: Hematology and Oncology

## 2018-11-05 NOTE — Telephone Encounter (Signed)
Pt Called to r/s 4/15 - I let her know that the appt will be a webex and someone will call her to set up the appt - pt is aware and will keep appt as is

## 2018-11-06 ENCOUNTER — Telehealth: Payer: Self-pay | Admitting: Hematology and Oncology

## 2018-11-06 NOTE — Telephone Encounter (Signed)
Spoke with patient and educated her daughter on downloading the app and verified that she received her email.

## 2018-11-06 NOTE — Progress Notes (Signed)
HEMATOLOGY-ONCOLOGY [place] VISIT PROGRESS NOTE  I connected with Kathryn Ortiz on 11/07/2018 at  3:15 PM EDT by Webex video conference and verified that I am speaking with the correct person using two identifiers.  I discussed the limitations, risks, security and privacy concerns of performing an evaluation and management service by Webex and the availability of in person appointments.  I also discussed with the patient that there may be a patient responsible charge related to this service. The patient expressed understanding and agreed to proceed.  Patient's Location: Home Physician Location: Clinic   CHIEF COMPLIANT: Follow-up on tamoxifen therapy  INTERVAL HISTORY: Kathryn Ortiz is a 78 y.o. female with above-mentioned history of right breast cancer treated with lumpectomy followed by adjuvant antiestrogen therapy. I last saw her a year ago. Her most recent mammogram from 02/20/18 showed no evidence of malignancy. She presents today over Webex for annual follow-up.  Her husband passed away and she has grieved over him for a long time and has moved to [facility] to be with her daughter.  She denies any pain lumps or nodules in the breast.    Breast cancer of upper-inner quadrant of right female breast ([place])   07/14/2015 Mammogram    Right breast posterior spiculated mass 2:00 position: 1.7 cm, multiple normal-sized right axillary lymph nodes with borderline diffuse cortical thickening    07/28/2015 Initial Diagnosis    Right breast biopsy 2:00: Invasive ductal carcinoma with DCIS, positive for perineural invasion, grade 1, ER 100%, PR 90%, HER-2 negative ratio 1.29, Ki-67 10%    08/24/2015 Clinical Stage    Stage IA: T1c N0    08/24/2015 Breast MRI    Right breast mass 2.4 x 1.8 x 1.6 cm, the enhancement does not appear to abut or invade the adjacent pectoralis muscle    09/25/2015 Surgery    Rt Lumpectomy: IDC grade 1, 2.1 cm, LG DCIS, LVI and PNI, DCIS focally less than 1 mm, 0/3  LN    09/25/2015 Pathologic Stage    Stage IIA: T2 N0    10/11/2015 -  Anti-estrogen oral therapy    Adjuvant antiestrogen therapy with anastrozole 1 mg by mouth daily, could not tolerate it, switched to tamoxifen 20 mg daily 01/11/2016     Radiation Therapy    Not recommended due to fatigue    01/15/2016 Survivorship    Survivorship care plan mailed to patient in lieu of in person visit at her request     REVIEW OF SYSTEMS:   Constitutional: Denies fevers, chills or abnormal weight loss Eyes: Denies blurriness of vision Ears, nose, mouth, throat, and face: Denies mucositis or sore throat Respiratory: Denies cough, dyspnea or wheezes Cardiovascular: Denies palpitation, chest discomfort Gastrointestinal:  Denies nausea, heartburn or change in bowel habits Skin: Denies abnormal skin rashes Lymphatics: Denies new lymphadenopathy or easy bruising Neurological:Denies numbness, tingling or new weaknesses Behavioral/Psych: Mood is stable, no new changes  Extremities: No lower extremity edema Breast: denies any pain or lumps or nodules in either breasts All other systems were reviewed with the patient and are negative.  Observations/Objective:  There were no vitals filed for this visit. There is no height or weight on file to calculate BMI.  I have reviewed the data as listed CMP Latest Ref Rng & Units 02/08/2017 02/07/2017 02/07/2017  Glucose 65 - 99 mg/dL 85 103(H) 110(H)  BUN 6 - 20 mg/dL '11 11 10  '$ Creatinine 0.44 - 1.00 mg/dL 1.40(H) 1.30(H) 1.39(H)  Sodium 135 -  145 mmol/L 140 140 138  Potassium 3.5 - 5.1 mmol/L 4.8 4.2 4.4  Chloride 101 - 111 mmol/L 109 104 106  CO2 22 - 32 mmol/L 26 - 24  Calcium 8.9 - 10.3 mg/dL 8.4(L) - 9.0  Total Protein 6.5 - 8.1 g/dL - - 6.1(L)  Total Bilirubin 0.3 - 1.2 mg/dL - - 0.3  Alkaline Phos 38 - 126 U/L - - 43  AST 15 - 41 U/L - - 27  ALT 14 - 54 U/L - - 13(L)    Lab Results  Component Value Date   WBC 4.2 02/07/2017   HGB 12.6 02/07/2017    HCT 37.0 02/07/2017   MCV 91.0 02/07/2017   PLT 145 (L) 02/07/2017   NEUTROABS 2.3 02/07/2017      Assessment Plan:  Breast cancer of upper-inner quadrant of right female breast ([place]) Rt Lumpectomy 09/28/15: IDC grade 1, 2.1 cm, LG DCIS, LVI and PNI, DCIS focally less than 1 mm, 0/3 LN, T2N0 (Stage 2A) Patient did not undergo radiation given her age Since her husband died, she moved to [place] which is 2 hours away from [place]. However she still insists that she would like to come here for her annual checkups.  Treatment plan: Antiestrogen therapy with anastrozole 1 mg daily started March 2017 stopped 01/11/2016 Starting tamoxifen 20 mg daily 01/11/2016  Tamoxifen toxicities: Much better tolerated, denies any of theprofoundmood swings or hot flashes.  Surveillance: 1.  Breast exam 10/10/2017: Benign  2. Mammograms  02/20/2018: Benign, breast density category B  Return to clinic in1 yearfor follow-up   I discussed the assessment and treatment plan with the patient. The patient was provided an opportunity to ask questions and all were answered. The patient agreed with the plan and demonstrated an understanding of the instructions. The patient was advised to call back or seek an in-person evaluation if the symptoms worsen or if the condition fails to improve as anticipated.   I provided 15 minutes of face-to-face Web Ex time during this encounter.    Rulon Eisenmenger, MD 11/07/2018    I, Molly Dorshimer, am acting as scribe for Nicholas Lose, MD.  I have reviewed the above documentation for accuracy and completeness, and I agree with the above.

## 2018-11-07 ENCOUNTER — Inpatient Hospital Stay: Payer: Medicare HMO | Attending: Hematology and Oncology | Admitting: Hematology and Oncology

## 2018-11-07 DIAGNOSIS — C50211 Malignant neoplasm of upper-inner quadrant of right female breast: Secondary | ICD-10-CM | POA: Diagnosis not present

## 2018-11-07 DIAGNOSIS — Z170 Estrogen receptor positive status [ER+]: Secondary | ICD-10-CM

## 2018-11-07 NOTE — Assessment & Plan Note (Signed)
Rt Lumpectomy 09/28/15: IDC grade 1, 2.1 cm, LG DCIS, LVI and PNI, DCIS focally less than 1 mm, 0/3 LN, T2N0 (Stage 2A) Patient did not undergo radiation given her age  Treatment plan: Antiestrogen therapy with anastrozole 1 mg daily started March 2017 stopped 01/11/2016 Starting tamoxifen 20 mg daily 01/11/2016  Tamoxifen toxicities: Much better tolerated, denies any of theprofoundmood swings or hot flashes. Patient's husband has been diagnosed with stage IV poorly differentiated lung cancer and is meeting with Dr. Julien Nordmann to discuss starting Pembrolizumab immunotherapy treatment. She is very anxious and worried about his health.  Surveillance: 1.  Breast exam 10/10/2017: Benign  2. Mammograms 02/20/2018: Benign, breast density category B  Return to clinic in1 yearfor follow-up

## 2018-12-15 IMAGING — MR MR HEAD W/O CM
9 of 11 series · 32 of 48 positions shown · non-contrast
Comparison: 02/07/2017 CT of the head.  11/04/2015 MRI of the head.

CLINICAL DATA: 76 y/o F; left-sided numbness, weakness, and slurred
speech.

EXAM:
MRI HEAD WITHOUT CONTRAST
MRA HEAD WITHOUT CONTRAST
TECHNIQUE: Multiplanar, multiecho pulse sequences of the brain and surrounding
structures were obtained without intravenous contrast. Angiographic
images of the head were obtained using MRA technique without
contrast.

[Series 3: DWI · axial · 3.0mm · 1.09mm/px · z∈[-87,+51]mm · 7 of 94 slices shown (1 of 4)]
[im 1/94]
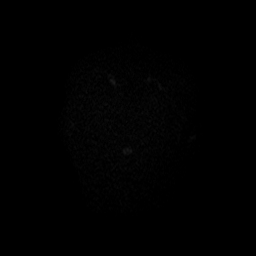
[im 16/94]
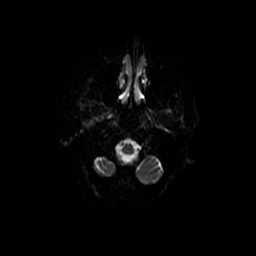
[im 32/94]
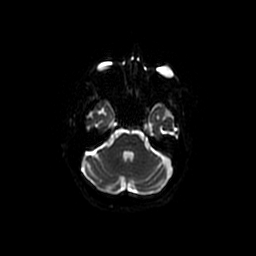
[im 47/94]
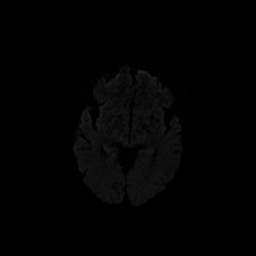
[im 63/94]
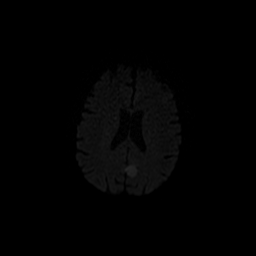
[im 78/94]
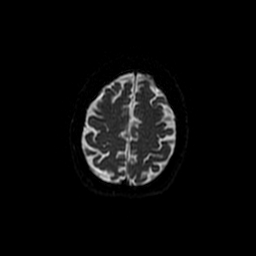
[im 94/94]
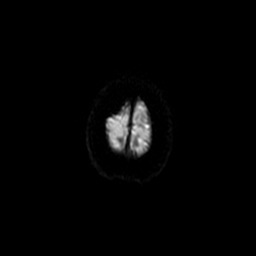

[Series 4: (id) mt fs · axial · 1.4mm · 0.43mm/px · z∈[-74,-17]mm · 5 of 136 slices shown]
[im 1/136]
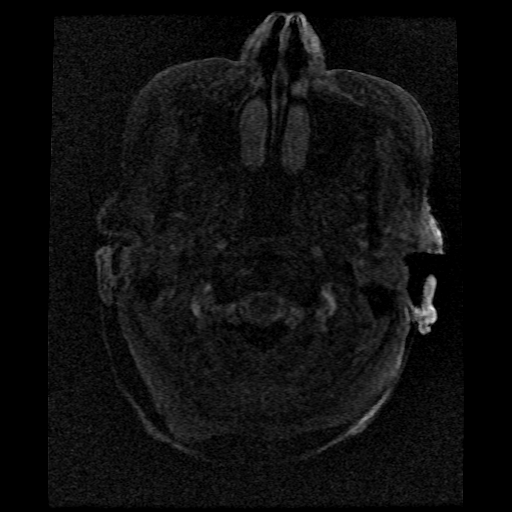
[im 28/136]
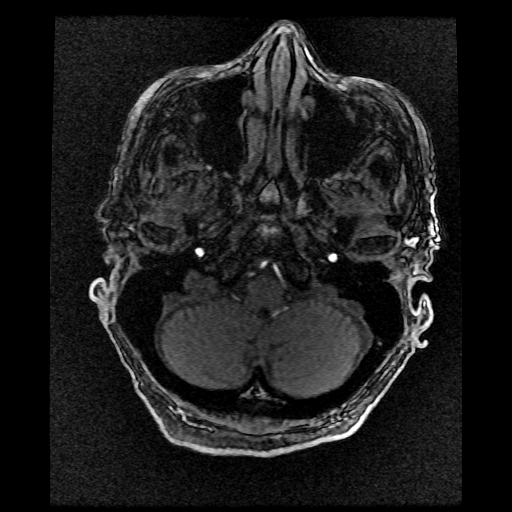
[im 41/136]
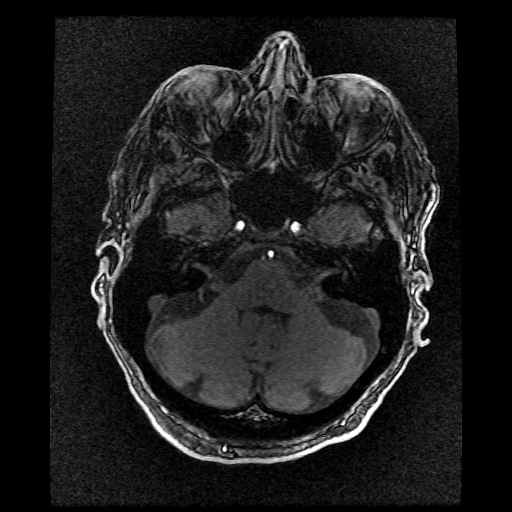
[im 55/136]
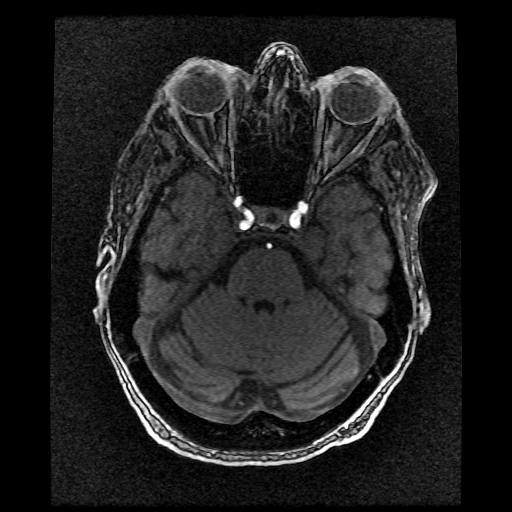
[im 82/136]
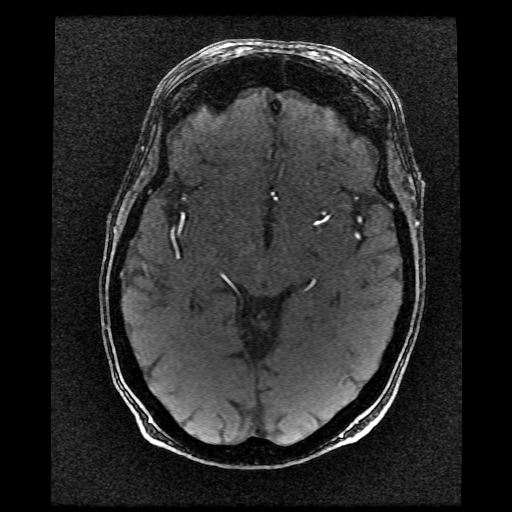

[Series 5: DWI · coronal · 5.0mm · 1.09mm/px · 5 of 66 slices shown (2 of 4)]
[im 1/66]
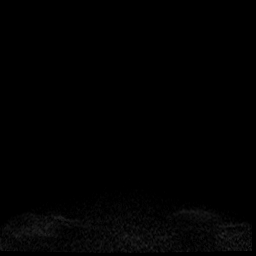
[im 17/66]
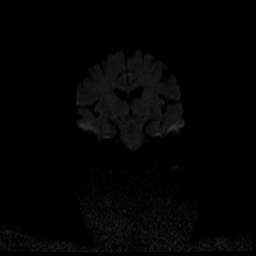
[im 33/66]
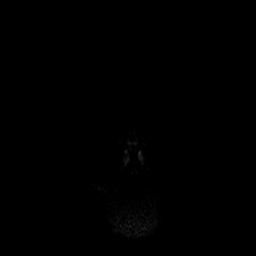
[im 49/66]
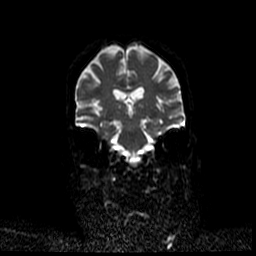
[im 66/66]
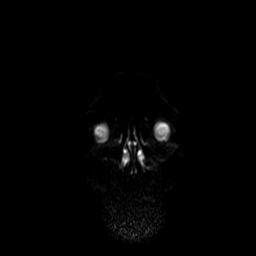

[Series 6: T1 · sagittal · 5.0mm · 0.47mm/px · 2 of 23 slices shown]
[im 1/23]
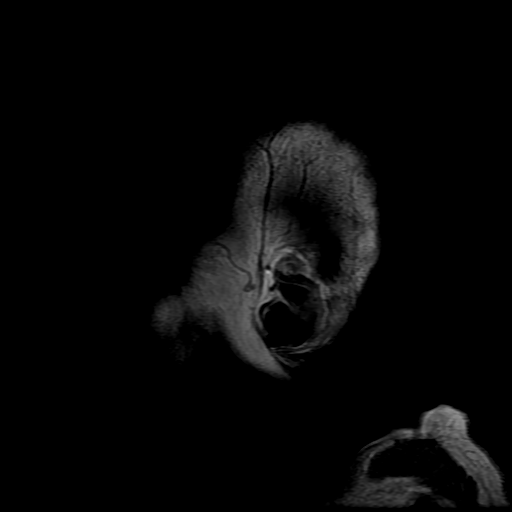
[im 23/23]
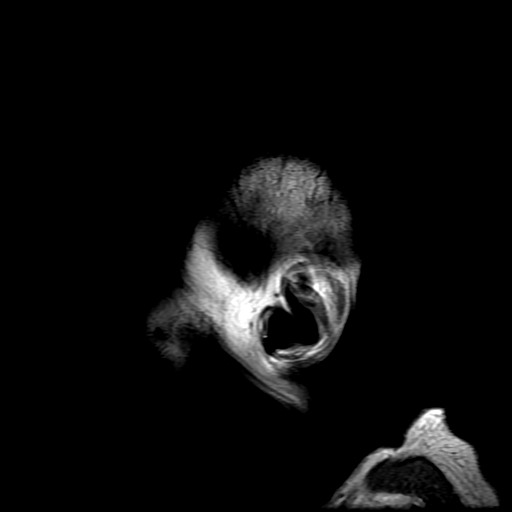

[Series 7: T2 · axial · 5.0mm · 0.43mm/px · z∈[-86,+52]mm · 2 of 24 slices shown (1 of 2)]
[im 1/24]
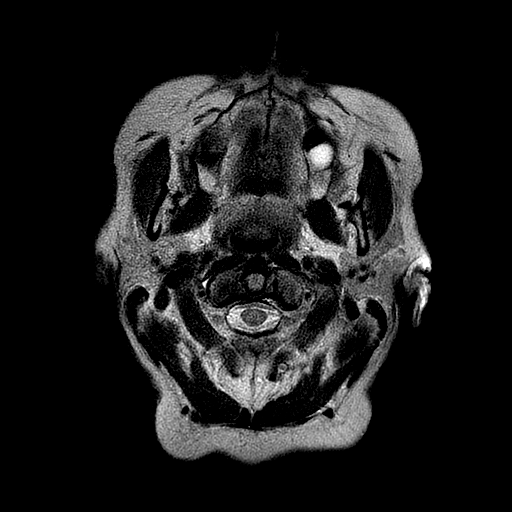
[im 24/24]
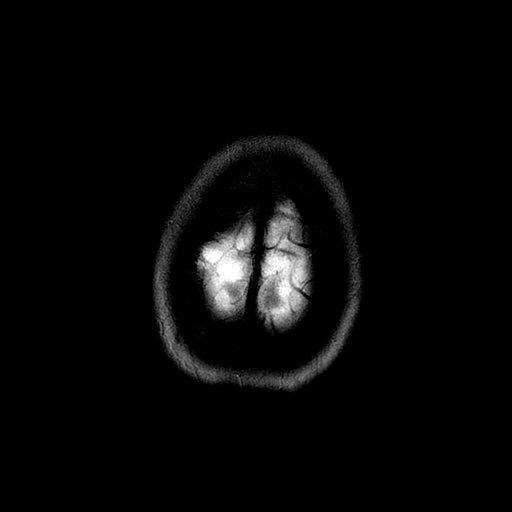

[Series 8: FLAIR · axial · 5.0mm · 0.43mm/px · z∈[-86,+52]mm · 2 of 24 slices shown]
[im 1/24]
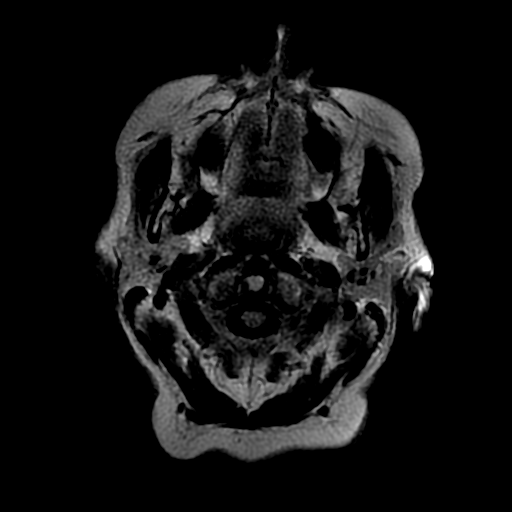
[im 24/24]
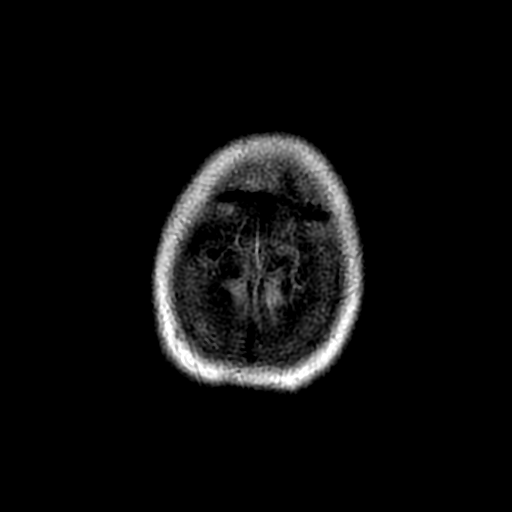

[Series 11: T2 · coronal · 5.0mm · 0.43mm/px · 2 of 28 slices shown (2 of 2)]
[im 1/28]
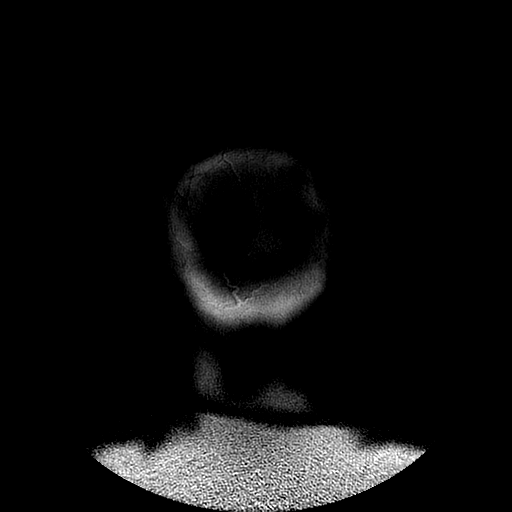
[im 28/28]
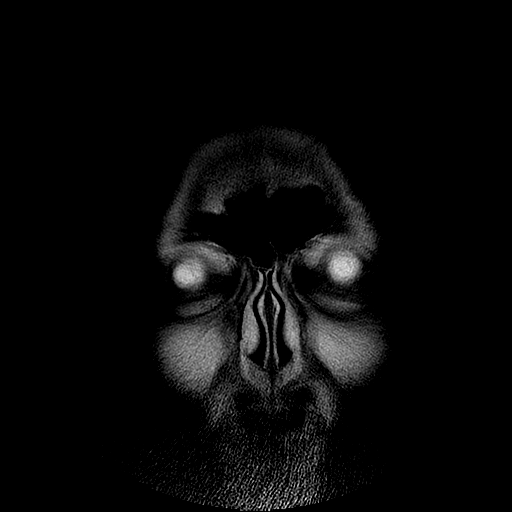

[Series 300: DWI · axial · 3.0mm · 1.09mm/px · z∈[-87,+51]mm · 4 of 47 slices shown (3 of 4)]
[im 1/47]
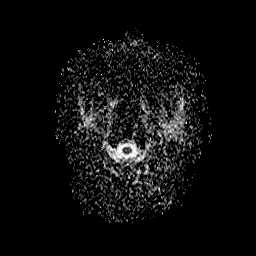
[im 16/47]
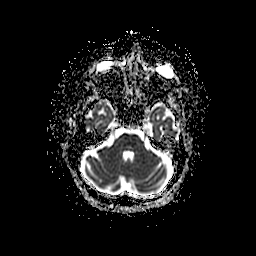
[im 31/47]
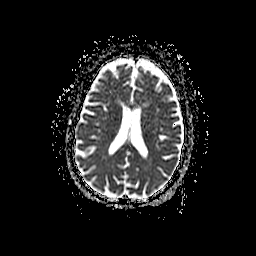
[im 47/47]
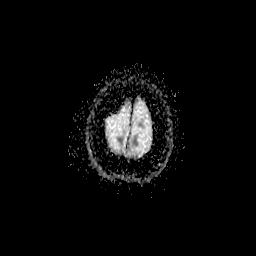

[Series 500: DWI · coronal · 5.0mm · 1.09mm/px · 3 of 33 slices shown (4 of 4)]
[im 1/33]
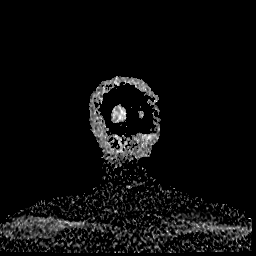
[im 17/33]
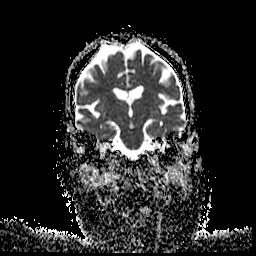
[im 33/33]
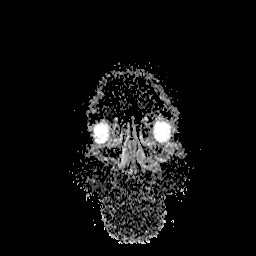

[32 of 48 positions shown; findings below may reference images not displayed]

FINDINGS: MRI HEAD FINDINGS

Brain: No diffusion restriction to suggest acute infarct. No
abnormal susceptibility hypointensity to indicate intracranial
hemorrhage. Few foci of T2 FLAIR hyperintense signal abnormality in
subcortical and periventricular white matter compatible with mild
chronic microvascular ischemic changes. Mild brain parenchymal
volume loss. No hydrocephalus. No extra-axial collection is
identified.

Stable 13 x 13 x 19 mm left parafalcine extra-axial mass compatible
with meningioma (series 7, image 17 and series 11, image 8). No
edema in the overlying brain.

Vascular: Normal flow voids.

Skull and upper cervical spine: Normal marrow signal.

Sinuses/Orbits: Small left maxillary sinus mucous retention cyst.
Otherwise negative.

Other: None.

MRA HEAD FINDINGS

Internal carotid arteries:  Patent.

Anterior cerebral arteries:  Patent.

Middle cerebral arteries: Patent.

Anterior communicating artery: Patent.

Posterior communicating arteries: Patent right. No left identified,
likely hypoplastic or absent.

Posterior cerebral arteries:  Patent.

Basilar artery:  Patent.

Vertebral arteries:  Patent.

No evidence of high-grade stenosis, large vessel occlusion, or
aneurysm.
IMPRESSION: 1. No acute intracranial abnormality identified.
2. Mild chronic microvascular ischemic changes and mild parenchymal
volume loss of the brain.
3. Stable left parafalcine parietal region meningioma measuring up
to 19 mm.
4. Normal MRA of the head.

By: Wilson Gustavo Azuma M.D.

## 2021-01-20 ENCOUNTER — Telehealth: Payer: Self-pay | Admitting: Medical Oncology

## 2021-01-20 NOTE — Telephone Encounter (Signed)
?   Stop Tamoxifen- "I think it is time for me to stop my medicine . I have been on it for 5 years".  Please advise. Last seen 2020

## 2021-01-21 NOTE — Telephone Encounter (Signed)
I instructed Renee ( dtr) that pt Kathryn Ortiz can stop tamoxifen and to keep her phone appt on Friday.

## 2021-01-21 NOTE — Assessment & Plan Note (Deleted)
Rt Lumpectomy 09/28/15: IDC grade 1, 2.1 cm, LG DCIS, LVI and PNI, DCIS focally less than 1 mm, 0/3 LN, T2N0 (Stage 2A) Patient did not undergo radiation given her age Since her husband died, she moved to [facility] which is 2 hours away from [place]. However she still insists that she would like to come here for her annual checkups.  Treatment plan: Antiestrogen therapy with anastrozole 1 mg daily started March 2017 stopped 01/11/2016 Started tamoxifen 20 mg daily 01/11/2016  Tamoxifen toxicities: Much better tolerated, denies any of theprofoundmood swings or hot flashes.  Surveillance: 1.Breast exam 01/21/2021: Benign 2.Mammograms  02/20/2018: Benign, breast density category B  Return to clinic in1 yearfor follow-up

## 2021-01-22 ENCOUNTER — Ambulatory Visit: Payer: Medicare HMO | Admitting: Hematology and Oncology

## 2021-01-22 ENCOUNTER — Telehealth: Payer: Self-pay | Admitting: Hematology and Oncology

## 2021-01-22 NOTE — Telephone Encounter (Signed)
Patient is a no-show for today's telephone visit I left her 2 voicemails to call us back and reschedule.

## 2021-01-22 NOTE — Telephone Encounter (Signed)
R/a appt per 7/1 sch msg. Pt aware.

## 2021-02-01 NOTE — Progress Notes (Signed)
HEMATOLOGY-ONCOLOGY TELEPHONE VISIT PROGRESS NOTE  I connected with Kathryn Ortiz on 02/02/2021 at  2:45 PM EDT by telephone and verified that I am speaking with the correct person using two identifiers.  I discussed the limitations, risks, security and privacy concerns of performing an evaluation and management service by telephone and the availability of in person appointments.  I also discussed with the patient that there may be a patient responsible charge related to this service. The patient expressed understanding and agreed to proceed.   History of Present Illness: Kathryn Ortiz is a 80 y.o. female with above-mentioned history of right breast cancer treated with lumpectomy followed by adjuvant antiestrogen therapy. She reports via MyChart today for follow-up. C/O hip pain in right hip. Denies any lumps and nodules  Oncology History  Breast cancer of upper-inner quadrant of right female breast ([place])  07/14/2015 Mammogram   Right breast posterior spiculated mass 2:00 position: 1.7 cm, multiple normal-sized right axillary lymph nodes with borderline diffuse cortical thickening    07/28/2015 Initial Diagnosis   Right breast biopsy 2:00: Invasive ductal carcinoma with DCIS, positive for perineural invasion, grade 1, ER 100%, PR 90%, HER-2 negative ratio 1.29, Ki-67 10%    08/24/2015 Clinical Stage   Stage IA: T1c N0    08/24/2015 Breast MRI   Right breast mass 2.4 x 1.8 x 1.6 cm, the enhancement does not appear to abut or invade the adjacent pectoralis muscle    09/25/2015 Surgery   Rt Lumpectomy: IDC grade 1, 2.1 cm, LG DCIS, LVI and PNI, DCIS focally less than 1 mm, 0/3 LN    09/25/2015 Pathologic Stage   Stage IIA: T2 N0    10/11/2015 -  Anti-estrogen oral therapy   Adjuvant antiestrogen therapy with anastrozole 1 mg by mouth daily, could not tolerate it, switched to tamoxifen 20 mg daily 01/11/2016     Radiation Therapy   Not recommended due to fatigue    01/15/2016  Survivorship   Survivorship care plan mailed to patient in lieu of in person visit at her request      Observations/Objective:  Hip pain   Assessment Plan:  Breast cancer of upper-inner quadrant of right female breast ([place]) Rt Lumpectomy 09/28/15: IDC grade 1, 2.1 cm, LG DCIS, LVI and PNI, DCIS focally less than 1 mm, 0/3 LN, T2N0 (Stage 2A) Patient did not undergo radiation given her age Since her husband died, she moved to [place] which is 2 hours away from [place].   Treatment plan: Antiestrogen therapy with anastrozole 1 mg daily started March 2017 stopped 01/11/2016 Started tamoxifen 20 mg daily 01/11/2016 completed 02/02/21   Tamoxifen toxicities: Hair thinning   Surveillance: Mammograms 2022 (at [facility]): Benign, breast density category B  Return to clinic by telephone visit in 1 year for follow-up   I discussed the assessment and treatment plan with the patient. The patient was provided an opportunity to ask questions and all were answered. The patient agreed with the plan and demonstrated an understanding of the instructions. The patient was advised to call back or seek an in-person evaluation if the symptoms worsen or if the condition fails to improve as anticipated.   Total time spent: 12 mins including non-face to face time and time spent for planning, charting and coordination of care  Rulon Eisenmenger, MD 02/02/2021    I, Thana Ates, am acting as scribe for Nicholas Lose, MD.  I have reviewed the above documentation for accuracy and completeness, and  I agree with the above.

## 2021-02-02 ENCOUNTER — Inpatient Hospital Stay: Payer: Medicare PPO | Attending: Hematology and Oncology | Admitting: Hematology and Oncology

## 2021-02-02 DIAGNOSIS — C50211 Malignant neoplasm of upper-inner quadrant of right female breast: Secondary | ICD-10-CM | POA: Diagnosis not present

## 2021-02-02 DIAGNOSIS — Z170 Estrogen receptor positive status [ER+]: Secondary | ICD-10-CM

## 2021-02-02 MED ORDER — HYDROCODONE-ACETAMINOPHEN 7.5-325 MG PO TABS: 1.0000 | ORAL_TABLET | Freq: Two times a day (BID) | ORAL | 0 refills | Status: AC

## 2021-02-02 NOTE — Assessment & Plan Note (Signed)
Rt Lumpectomy 09/28/15: IDC grade 1, 2.1 cm, LG DCIS, LVI and PNI, DCIS focally less than 1 mm, 0/3 LN, T2N0 (Stage 2A) Patient did not undergo radiation given her age Since her husband died, she moved to [facility] which is 2 hours away from [place]. However she still insists that she would like to come here for her annual checkups.  Treatment plan: Antiestrogen therapy with anastrozole 1 mg daily started March 2017 stopped 01/11/2016 Started tamoxifen 20 mg daily 01/11/2016  Tamoxifen toxicities: Much better tolerated, denies any of theprofoundmood swings or hot flashes.  Surveillance: 1.Breast exam 01/21/2021: Benign 2.Mammograms  02/20/2018: Benign, breast density category B Patient will need a new mammogram. Return to clinic in1 yearfor follow-up

## 2021-02-04 ENCOUNTER — Telehealth: Payer: Self-pay | Admitting: Hematology and Oncology

## 2021-02-04 NOTE — Telephone Encounter (Signed)
Scheduled per 7/12 los called and spoke with pt confirmed added appt

## 2022-02-01 ENCOUNTER — Telehealth: Payer: Self-pay | Admitting: Hematology and Oncology

## 2022-02-01 NOTE — Telephone Encounter (Signed)
Rescheduled appointment per room/resource. Patient is aware of the changes made to her upcoming appointment. 

## 2022-02-02 ENCOUNTER — Inpatient Hospital Stay: Payer: Medicare Other | Attending: Hematology and Oncology | Admitting: Hematology and Oncology

## 2022-02-02 ENCOUNTER — Inpatient Hospital Stay: Payer: Medicare Other | Admitting: Hematology and Oncology

## 2022-02-02 DIAGNOSIS — Z170 Estrogen receptor positive status [ER+]: Secondary | ICD-10-CM

## 2022-02-02 DIAGNOSIS — C50211 Malignant neoplasm of upper-inner quadrant of right female breast: Secondary | ICD-10-CM

## 2022-02-02 NOTE — Progress Notes (Signed)
HEMATOLOGY-ONCOLOGY TELEPHONE VISIT PROGRESS NOTE  I connected with our patient on 02/02/22 at 11:45 AM EDT by telephone and verified that I am speaking with the correct person using two identifiers.  I discussed the limitations, risks, security and privacy concerns of performing an evaluation and management service by telephone and the availability of in person appointments.  I also discussed with the patient that there may be a patient responsible charge related to this service. The patient expressed understanding and agreed to proceed.   History of Present Illness: Follow up of H/O breast cancer C/O knee pains and Hip pains  Oncology History  Breast cancer of upper-inner quadrant of right female breast ([place])  07/14/2015 Mammogram   Right breast posterior spiculated mass 2:00 position: 1.7 cm, multiple normal-sized right axillary lymph nodes with borderline diffuse cortical thickening   07/28/2015 Initial Diagnosis   Right breast biopsy 2:00: Invasive ductal carcinoma with DCIS, positive for perineural invasion, grade 1, ER 100%, PR 90%, HER-2 negative ratio 1.29, Ki-67 10%   08/24/2015 Clinical Stage   Stage IA: T1c N0   08/24/2015 Breast MRI   Right breast mass 2.4 x 1.8 x 1.6 cm, the enhancement does not appear to abut or invade the adjacent pectoralis muscle   09/25/2015 Surgery   Rt Lumpectomy: IDC grade 1, 2.1 cm, LG DCIS, LVI and PNI, DCIS focally less than 1 mm, 0/3 LN   09/25/2015 Pathologic Stage   Stage IIA: T2 N0   10/11/2015 -  Anti-estrogen oral therapy   Adjuvant antiestrogen therapy with anastrozole 1 mg by mouth daily, could not tolerate it, switched to tamoxifen 20 mg daily 01/11/2016    Radiation Therapy   Not recommended due to fatigue   01/15/2016 Survivorship   Survivorship care plan mailed to patient in lieu of in person visit at her request     REVIEW OF SYSTEMS:   Constitutional: Denies fevers, chills or abnormal weight loss All other systems were reviewed  with the patient and are negative. Observations/Objective:     Assessment Plan:  Breast cancer of upper-inner quadrant of right female breast ([place]) Rt Lumpectomy 09/28/15: IDC grade 1, 2.1 cm, LG DCIS, LVI and PNI, DCIS focally less than 1 mm, 0/3 LN, T2N0 (Stage 2A) Patient did not undergo radiation given her age Since her husband died, she moved to [facility] which is 2 hours away from [place].   Treatment Summary: Antiestrogen therapy with anastrozole 1 mg daily started March 2017 stopped 01/11/2016 Started tamoxifen 20 mg daily 01/11/2016 completed 02/02/21   Surveillance: Mammograms (at [facility]): Benign, breast density category B  Knee pain: S/P knee replacement, on Gabapentin  RTC in 1 year   I discussed the assessment and treatment plan with the patient. The patient was provided an opportunity to ask questions and all were answered. The patient agreed with the plan and demonstrated an understanding of the instructions. The patient was advised to call back or seek an in-person evaluation if the symptoms worsen or if the condition fails to improve as anticipated.   I provided 11 minutes of non-face-to-face time during this encounter.  This includes time for charting and coordination of care   Harriette Ohara, MD

## 2022-02-02 NOTE — Assessment & Plan Note (Addendum)
Rt Lumpectomy 09/28/15: IDC grade 1, 2.1 cm, LG DCIS, LVI and PNI, DCIS focally less than 1 mm, 0/3 LN, T2N0 (Stage 2A) Patient did not undergo radiation given her age Since her husband died, she moved to [facility] which is 2 hours away from [place].  Treatment Summary: Antiestrogen therapy with anastrozole 1 mg daily started March 2017 stopped 01/11/2016 Started tamoxifen 20 mg daily 01/11/2016 completed 02/02/21  Surveillance: Mammograms (at [facility]): Benign, breast density category B  Knee pain: S/P knee replacement, on Gabapentin  RTC in 1 year

## 2022-02-03 ENCOUNTER — Telehealth: Payer: Self-pay | Admitting: Hematology and Oncology

## 2022-02-03 NOTE — Telephone Encounter (Signed)
Scheduled appointment per 7/12 los. Patient is aware.

## 2023-02-04 NOTE — Progress Notes (Signed)
HEMATOLOGY-ONCOLOGY TELEPHONE VISIT PROGRESS NOTE  I connected with our patient on 02/06/23 at 11:15 AM EDT by telephone and verified that I am speaking with the correct person using two identifiers.  I discussed the limitations, risks, security and privacy concerns of performing an evaluation and management service by telephone and the availability of in person appointments.  I also discussed with the patient that there may be a patient responsible charge related to this service. The patient expressed understanding and agreed to proceed.   History of Present Illness: Kathryn Ortiz is a 82 y.o female currently on observastion. She presents to the clinic for a telephone follow-up. C/O knee pain.  Oncology History  Breast cancer of upper-inner quadrant of right female breast ([facility])  07/14/2015 Mammogram   Right breast posterior spiculated mass 2:00 position: 1.7 cm, multiple normal-sized right axillary lymph nodes with borderline diffuse cortical thickening   07/28/2015 Initial Diagnosis   Right breast biopsy 2:00: Invasive ductal carcinoma with DCIS, positive for perineural invasion, grade 1, ER 100%, PR 90%, HER-2 negative ratio 1.29, Ki-67 10%   08/24/2015 Clinical Stage   Stage IA: T1c N0   08/24/2015 Breast MRI   Right breast mass 2.4 x 1.8 x 1.6 cm, the enhancement does not appear to abut or invade the adjacent pectoralis muscle   09/25/2015 Surgery   Rt Lumpectomy: IDC grade 1, 2.1 cm, LG DCIS, LVI and PNI, DCIS focally less than 1 mm, 0/3 LN   09/25/2015 Pathologic Stage   Stage IIA: T2 N0   10/11/2015 -  Anti-estrogen oral therapy   Adjuvant antiestrogen therapy with anastrozole 1 mg by mouth daily, could not tolerate it, switched to tamoxifen 20 mg daily 01/11/2016    Radiation Therapy   Not recommended due to fatigue   01/15/2016 Survivorship   Survivorship care plan mailed to patient in lieu of in person visit at her request     REVIEW OF SYSTEMS:   Constitutional: Denies  fevers, chills or abnormal weight loss All other systems were reviewed with the patient and are negative. Observations/Objective:     Assessment Plan:  Breast cancer of upper-inner quadrant of right female breast ([facility]) Rt Lumpectomy 09/28/15: IDC grade 1, 2.1 cm, LG DCIS, LVI and PNI, DCIS focally less than 1 mm, 0/3 LN, T2N0 (Stage 2A) Patient did not undergo radiation given her age Since her husband died, she moved to [facility] which is 2 hours away from [place].   Treatment Summary: Antiestrogen therapy with anastrozole 1 mg daily started March 2017 stopped 01/11/2016 Started tamoxifen 20 mg daily 01/11/2016 completed 02/02/21   Surveillance: Mammograms (at [facility]): Benign, breast density category B   Knee pain: S/P Rt knee replacement, on Gabapentin C/O Lt knee pain   RTC on an as needed basis.   I discussed the assessment and treatment plan with the patient. The patient was provided an opportunity to ask questions and all were answered. The patient agreed with the plan and demonstrated an understanding of the instructions. The patient was advised to call back or seek an in-person evaluation if the symptoms worsen or if the condition fails to improve as anticipated.   I provided 12 minutes of non-face-to-face time during this encounter.  This includes time for charting and coordination of care   Tamsen Meek, MD  I Janan Ridge am acting as a scribe for Dr.Barrie Sigmund  I have reviewed the above documentation for accuracy and completeness, and I agree with the above.

## 2023-02-06 ENCOUNTER — Inpatient Hospital Stay: Payer: Medicare Other | Attending: Hematology and Oncology | Admitting: Hematology and Oncology

## 2023-02-06 DIAGNOSIS — Z170 Estrogen receptor positive status [ER+]: Secondary | ICD-10-CM

## 2023-02-06 DIAGNOSIS — C50211 Malignant neoplasm of upper-inner quadrant of right female breast: Secondary | ICD-10-CM | POA: Diagnosis not present

## 2023-02-06 NOTE — Assessment & Plan Note (Signed)
Rt Lumpectomy 09/28/15: IDC grade 1, 2.1 cm, LG DCIS, LVI and PNI, DCIS focally less than 1 mm, 0/3 LN, T2N0 (Stage 2A) Patient did not undergo radiation given her age Since her husband died, she moved to [facility] which is 2 hours away from [place].   Treatment Summary: Antiestrogen therapy with anastrozole 1 mg daily started March 2017 stopped 01/11/2016 Started tamoxifen 20 mg daily 01/11/2016 completed 02/02/21   Surveillance: Mammograms (at [facility]): Benign, breast density category B Breast exam 02/06/2023: Benign   Knee pain: S/P knee replacement, on Gabapentin   RTC in 1 year
